# Patient Record
Sex: Female | Born: 1935 | Race: White | Hispanic: No | Smoking: Never smoker
Health system: Southern US, Community
[De-identification: ages and names within clinical notes are randomized; demographics above are authoritative.]

## PROBLEM LIST (undated history)

## (undated) DIAGNOSIS — M1990 Unspecified osteoarthritis, unspecified site: Secondary | ICD-10-CM

## (undated) DIAGNOSIS — C801 Malignant (primary) neoplasm, unspecified: Secondary | ICD-10-CM

## (undated) DIAGNOSIS — I82409 Acute embolism and thrombosis of unspecified deep veins of unspecified lower extremity: Secondary | ICD-10-CM

## (undated) DIAGNOSIS — E785 Hyperlipidemia, unspecified: Secondary | ICD-10-CM

## (undated) DIAGNOSIS — F0390 Unspecified dementia without behavioral disturbance: Secondary | ICD-10-CM

## (undated) DIAGNOSIS — M810 Age-related osteoporosis without current pathological fracture: Secondary | ICD-10-CM

## (undated) DIAGNOSIS — E7800 Pure hypercholesterolemia, unspecified: Secondary | ICD-10-CM

## (undated) HISTORY — PX: SKIN BIOPSY: SHX1

## (undated) HISTORY — DX: Pure hypercholesterolemia, unspecified: E78.00

## (undated) HISTORY — DX: Unspecified dementia, unspecified severity, without behavioral disturbance, psychotic disturbance, mood disturbance, and anxiety: F03.90

## (undated) HISTORY — PX: FRACTURE SURGERY: SHX138

## (undated) HISTORY — PX: ABDOMINAL HYSTERECTOMY: SHX81

## (undated) HISTORY — PX: EYE SURGERY: SHX253

## (undated) HISTORY — DX: Age-related osteoporosis without current pathological fracture: M81.0

## (undated) HISTORY — PX: MASTECTOMY: SHX3

---

## 2004-08-02 ENCOUNTER — Ambulatory Visit: Payer: Self-pay | Admitting: Internal Medicine

## 2004-10-30 ENCOUNTER — Emergency Department: Payer: Self-pay | Admitting: Emergency Medicine

## 2004-11-01 ENCOUNTER — Ambulatory Visit: Payer: Self-pay | Admitting: Internal Medicine

## 2004-11-03 ENCOUNTER — Ambulatory Visit: Payer: Self-pay | Admitting: Internal Medicine

## 2004-11-08 ENCOUNTER — Ambulatory Visit: Payer: Self-pay | Admitting: Internal Medicine

## 2004-11-15 ENCOUNTER — Ambulatory Visit: Payer: Self-pay | Admitting: Internal Medicine

## 2004-12-07 ENCOUNTER — Ambulatory Visit: Payer: Self-pay | Admitting: Psychiatry

## 2005-06-15 ENCOUNTER — Other Ambulatory Visit: Payer: Self-pay

## 2005-06-15 ENCOUNTER — Ambulatory Visit: Payer: Self-pay | Admitting: General Practice

## 2005-06-22 ENCOUNTER — Ambulatory Visit: Payer: Self-pay | Admitting: General Practice

## 2005-07-06 ENCOUNTER — Encounter: Payer: Self-pay | Admitting: General Practice

## 2005-07-16 ENCOUNTER — Encounter: Payer: Self-pay | Admitting: General Practice

## 2005-08-02 ENCOUNTER — Inpatient Hospital Stay: Payer: Self-pay | Admitting: Podiatry

## 2006-02-06 ENCOUNTER — Ambulatory Visit: Payer: Self-pay | Admitting: Internal Medicine

## 2006-06-19 ENCOUNTER — Ambulatory Visit: Payer: Self-pay | Admitting: Surgery

## 2006-06-19 ENCOUNTER — Other Ambulatory Visit: Payer: Self-pay

## 2006-06-27 ENCOUNTER — Ambulatory Visit: Payer: Self-pay | Admitting: Surgery

## 2007-01-30 ENCOUNTER — Ambulatory Visit: Payer: Self-pay | Admitting: Internal Medicine

## 2007-02-11 ENCOUNTER — Ambulatory Visit: Payer: Self-pay | Admitting: Internal Medicine

## 2007-02-19 DIAGNOSIS — C4492 Squamous cell carcinoma of skin, unspecified: Secondary | ICD-10-CM

## 2007-02-19 HISTORY — DX: Squamous cell carcinoma of skin, unspecified: C44.92

## 2007-02-26 ENCOUNTER — Ambulatory Visit: Payer: Self-pay | Admitting: Unknown Physician Specialty

## 2007-03-13 ENCOUNTER — Ambulatory Visit: Payer: Self-pay | Admitting: Otolaryngology

## 2007-04-15 ENCOUNTER — Ambulatory Visit: Payer: Self-pay | Admitting: Otolaryngology

## 2007-08-08 ENCOUNTER — Encounter: Payer: Self-pay | Admitting: Internal Medicine

## 2007-08-10 ENCOUNTER — Encounter: Payer: Self-pay | Admitting: Internal Medicine

## 2007-09-10 ENCOUNTER — Encounter: Payer: Self-pay | Admitting: Internal Medicine

## 2008-01-14 ENCOUNTER — Ambulatory Visit: Payer: Self-pay | Admitting: Unknown Physician Specialty

## 2008-02-13 ENCOUNTER — Ambulatory Visit: Payer: Self-pay | Admitting: Internal Medicine

## 2009-01-17 ENCOUNTER — Ambulatory Visit: Payer: Self-pay | Admitting: Unknown Physician Specialty

## 2009-02-23 ENCOUNTER — Ambulatory Visit: Payer: Self-pay | Admitting: Internal Medicine

## 2009-05-18 ENCOUNTER — Ambulatory Visit: Payer: Self-pay | Admitting: Internal Medicine

## 2009-11-23 ENCOUNTER — Ambulatory Visit: Payer: Self-pay | Admitting: Internal Medicine

## 2010-01-18 ENCOUNTER — Ambulatory Visit: Payer: Self-pay | Admitting: Unknown Physician Specialty

## 2010-04-03 ENCOUNTER — Ambulatory Visit: Payer: Self-pay | Admitting: Internal Medicine

## 2010-04-03 ENCOUNTER — Ambulatory Visit: Payer: Self-pay | Admitting: Ophthalmology

## 2011-04-10 ENCOUNTER — Ambulatory Visit: Payer: Self-pay | Admitting: Internal Medicine

## 2017-12-11 DIAGNOSIS — G3184 Mild cognitive impairment, so stated: Secondary | ICD-10-CM | POA: Insufficient documentation

## 2018-02-09 ENCOUNTER — Other Ambulatory Visit: Payer: Self-pay

## 2018-02-09 ENCOUNTER — Emergency Department
Admission: EM | Admit: 2018-02-09 | Discharge: 2018-02-09 | Disposition: A | Discharge status: Home / Self Care | Payer: Medicare Other | Attending: Emergency Medicine | Admitting: Emergency Medicine

## 2018-02-09 ENCOUNTER — Encounter: Payer: Self-pay | Admitting: Emergency Medicine

## 2018-02-09 ENCOUNTER — Emergency Department: Payer: Medicare Other

## 2018-02-09 DIAGNOSIS — R51 Headache: Secondary | ICD-10-CM | POA: Diagnosis not present

## 2018-02-09 DIAGNOSIS — R519 Headache, unspecified: Secondary | ICD-10-CM

## 2018-02-09 DIAGNOSIS — Z86718 Personal history of other venous thrombosis and embolism: Secondary | ICD-10-CM | POA: Diagnosis not present

## 2018-02-09 DIAGNOSIS — H538 Other visual disturbances: Secondary | ICD-10-CM | POA: Diagnosis present

## 2018-02-09 DIAGNOSIS — Z853 Personal history of malignant neoplasm of breast: Secondary | ICD-10-CM | POA: Insufficient documentation

## 2018-02-09 HISTORY — DX: Malignant (primary) neoplasm, unspecified: C80.1

## 2018-02-09 HISTORY — DX: Unspecified osteoarthritis, unspecified site: M19.90

## 2018-02-09 HISTORY — DX: Hyperlipidemia, unspecified: E78.5

## 2018-02-09 HISTORY — DX: Acute embolism and thrombosis of unspecified deep veins of unspecified lower extremity: I82.409

## 2018-02-09 LAB — DIFFERENTIAL
BASOS PCT: 1 %
Basophils Absolute: 0 10*3/uL (ref 0–0.1)
Eosinophils Absolute: 0.1 10*3/uL (ref 0–0.7)
Eosinophils Relative: 1 %
LYMPHS PCT: 21 %
Lymphs Abs: 1.1 10*3/uL (ref 1.0–3.6)
MONO ABS: 0.4 10*3/uL (ref 0.2–0.9)
Monocytes Relative: 8 %
NEUTROS ABS: 3.5 10*3/uL (ref 1.4–6.5)
Neutrophils Relative %: 69 %

## 2018-02-09 LAB — CBC
HCT: 40.4 % (ref 35.0–47.0)
HEMOGLOBIN: 13.8 g/dL (ref 12.0–16.0)
MCH: 29.7 pg (ref 26.0–34.0)
MCHC: 34.3 g/dL (ref 32.0–36.0)
MCV: 86.6 fL (ref 80.0–100.0)
PLATELETS: 162 10*3/uL (ref 150–440)
RBC: 4.66 MIL/uL (ref 3.80–5.20)
RDW: 14.6 % — ABNORMAL HIGH (ref 11.5–14.5)
WBC: 5.1 10*3/uL (ref 3.6–11.0)

## 2018-02-09 LAB — COMPREHENSIVE METABOLIC PANEL
ALK PHOS: 63 U/L (ref 38–126)
ALT: 14 U/L (ref 0–44)
ANION GAP: 7 (ref 5–15)
AST: 18 U/L (ref 15–41)
Albumin: 3.8 g/dL (ref 3.5–5.0)
BUN: 20 mg/dL (ref 8–23)
CALCIUM: 9.3 mg/dL (ref 8.9–10.3)
CO2: 28 mmol/L (ref 22–32)
Chloride: 104 mmol/L (ref 98–111)
Creatinine, Ser: 0.64 mg/dL (ref 0.44–1.00)
GFR calc non Af Amer: >60 mL/min (ref >60)
Glucose, Bld: 89 mg/dL (ref 70–99)
Potassium: 4.2 mmol/L (ref 3.5–5.1)
Sodium: 139 mmol/L (ref 135–145)
Total Bilirubin: 0.8 mg/dL (ref 0.3–1.2)
Total Protein: 6.6 g/dL (ref 6.5–8.1)

## 2018-02-09 LAB — TROPONIN I

## 2018-02-09 LAB — PROTIME-INR
INR: 1.1
PROTHROMBIN TIME: 14.1 s (ref 11.4–15.2)

## 2018-02-09 LAB — APTT: aPTT: 34 seconds (ref 24–36)

## 2018-02-09 LAB — GLUCOSE, CAPILLARY: Glucose-Capillary: 80 mg/dL (ref 70–99)

## 2018-02-09 NOTE — ED Notes (Signed)
Assisted pt to bathroom

## 2018-02-09 NOTE — ED Provider Notes (Signed)
[facility] Emergency Department Provider Note  ____________________________________________  Time seen: Approximately 3:05 PM  I have reviewed the triage vital signs and the nursing notes.   HISTORY  Chief Complaint Blurred Vision   HPI Anne Oneill is a 84 y.o. female with a history of DVT on Eliquis, cataracts surgery, breast cancer on remission, mitral valve prolapse, hyperlipidemia and osteoporosis who presents for evaluation of abnormal vision.  Patient reports around 11 AM this morning she started having blurry vision and seeing wavy lines.  Started on the left eye, moved to both eyes, and then moved to the right eye.  She developed a mild L sided HA after that which has now subsided.  Patient had a second episode around 2 PM which was similar.  No slurred speech, no facial droop, no unilateral weakness or numbness, no gait instability, no chest pain or shortness of breath.  According to the daughter patient had a similar episode in 05/2017 in [place] for which she was admitted and underwent an extensive work-up including MRI to rule out TIA/CVA.  Imaging studies were all negative for stroke and in the end they were not sure if patient had a migraine with visual aura or a TIA.  Patient denies any history of smoking.  Her father had a stroke.  According to the daughter she feels the patient has been slightly slower than normal today  Past Medical History:  Diagnosis Date  . Arthritis   . Cancer ([place])   . DVT (deep venous thrombosis) ([place])   . Hyperlipemia      Past Surgical History:  Procedure Laterality Date  . ABDOMINAL HYSTERECTOMY    . EYE SURGERY    . FRACTURE SURGERY    . MASTECTOMY Right   . SKIN BIOPSY      Prior to Admission medications   Not on File    Allergies Patient has no known allergies.  FH Heart disease Brother    Heart disease Father    Stroke Father    High blood pressure (Hypertension) Sister       Social  History Social History   Tobacco Use  . Smoking status: Never Smoker  . Smokeless tobacco: Never Used  Substance Use Topics  . Alcohol use: Not Currently  . Drug use: Not Currently    Review of Systems  Constitutional: Negative for fever. Eyes: + blurry vision ENT: Negative for sore throat. Neck: No neck pain  Cardiovascular: Negative for chest pain. Respiratory: Negative for shortness of breath. Gastrointestinal: Negative for abdominal pain, vomiting or diarrhea. Genitourinary: Negative for dysuria. Musculoskeletal: Negative for back pain. Skin: Negative for rash. Neurological: Negative for headaches, weakness or numbness. Psych: No SI or HI  ____________________________________________   PHYSICAL EXAM:  VITAL SIGNS: ED Triage Vitals [02/09/18 1401]  Enc Vitals Group     BP 139/70     Pulse Rate 74     Resp 16     Temp 98.4 F (36.9 C)     Temp Source Oral     SpO2 96 %     Weight 98 lb (44.5 kg)     Height 5' (1.524 m)     Head Circumference      Peak Flow      Pain Score 0     Pain Loc      Pain Edu?      Excl. in [place]?     Constitutional: Alert and oriented. Well appearing and in no apparent  distress. HEENT:      Head: Normocephalic and atraumatic.         Eyes: Conjunctivae are normal. Sclera is non-icteric.       Mouth/Throat: Mucous membranes are moist.       Neck: Supple with no signs of meningismus. No bruits Cardiovascular: Regular rate and rhythm. No murmurs, gallops, or rubs. 2+ symmetrical distal pulses are present in all extremities. No JVD. Respiratory: Normal respiratory effort. Lungs are clear to auscultation bilaterally. No wheezes, crackles, or rhonchi.  Gastrointestinal: Soft, non tender, and non distended with positive bowel sounds. No rebound or guarding. Musculoskeletal: Nontender with normal range of motion in all extremities. No edema, cyanosis, or erythema of extremities. Neurologic: Normal speech and language. A & O x3, PERRL,  EOMI, no nystagmus, CN II-XII intact, motor testing reveals good tone and bulk throughout. There is no evidence of pronator drift or dysmetria. Muscle strength is 5/5 throughout. Sensory examination is intact. Gait deferred. Skin: Skin is warm, dry and intact. No rash noted. Psychiatric: Mood and affect are normal. Speech and behavior are normal.  ____________________________________________   LABS (all labs ordered are listed, but only abnormal results are displayed)  Labs Reviewed  CBC - Abnormal; Notable for the following components:      Result Value   RDW 14.6 (*)    All other components within normal limits  GLUCOSE, CAPILLARY  PROTIME-INR  APTT  DIFFERENTIAL  COMPREHENSIVE METABOLIC PANEL  TROPONIN I  CBG MONITORING, ED   ____________________________________________  EKG  ED ECG REPORT I, Rudene Re, the attending physician, personally viewed and interpreted this ECG.  Normal sinus rhythm, rate of 76, normal intervals, normal axis, LVH, no ST elevations or depressions, T wave inversion in aVL.  Unchanged from prior from 2008 ____________________________________________  RADIOLOGY  I have personally reviewed the images performed during this visit and I agree with the Radiologist's read.   Interpretation by Radiologist:  Ct Head Wo Contrast  Result Date: 02/09/2018 CLINICAL DATA:  Possible TIA. Blurred vision that began about 10 minutes prior to emergency department arrival, beginning in the RIGHT eye and thin involving the BILATERAL eyes. Patient states that her vision has now improved. Acute mental status changes earlier today according to the patient's daughter with confusion. EXAM: CT HEAD WITHOUT CONTRAST TECHNIQUE: Contiguous axial images were obtained from the base of the skull through the vertex without intravenous contrast. COMPARISON:  MRI brain 11/03/2004.  No prior CT. FINDINGS: Brain: Moderate age related cortical, deep and cerebellar atrophy. Severe  changes of small vessel disease of the white matter, most prominent in the parietal lobes. Dystrophic calcification involving the LEFT cerebellar hemisphere. No mass lesion. No midline shift. No acute hemorrhage or hematoma. No extra-axial fluid collections. No evidence of acute infarction. Vascular: Mild BILATERAL carotid siphon and LEFT vertebral artery atherosclerosis. No convincing hyperdense vessel. Skull: No skull fracture or other focal osseous abnormality involving the skull. Sinuses/Orbits: Visualized paranasal sinuses, bilateral mastoid air cells and bilateral middle ear cavities well-aerated. Visualized orbits and globes normal in appearance apart from prior ophthalmologic surgery involving the RIGHT eye. Other: None. IMPRESSION: 1. No acute intracranial abnormality. 2. Age related moderate generalized atrophy and severe changes of small vessel disease of the white matter. 3. Calcification involving the LEFT cerebellar hemisphere, likely dystrophic and related to prior trauma or ischemia. Electronically Signed   By: Evangeline Dakin M.D.   On: 02/09/2018 15:24   Mr Brain Wo Contrast  Result Date: 02/09/2018 CLINICAL DATA:  TIA,  initial exam.  Blurry vision that has improved. EXAM: MRI HEAD WITHOUT CONTRAST TECHNIQUE: Multiplanar, multiecho pulse sequences of the brain and surrounding structures were obtained without intravenous contrast. COMPARISON:  Head CT earlier today.  Brain MRI 11/03/2004 FINDINGS: Brain: No acute infarction, hemorrhage, hydrocephalus, extra-axial collection or mass effect. There is generalized cerebral volume loss and mild to moderate for age chronic small vessel ischemia in the cerebral white matter. A 9 mm nodule along the left aspect of the falx in the high left frontal region is attributed to meningioma. This nodule is new from 2006. Vascular: Very diminutive left carotid artery with intracranial normalization likely from the anterior communicating artery. This has been  noted since at least 2006. Skull and upper cervical spine: No evidence of marrow lesion. Cervical facet spurring with C4-5 anterolisthesis. Sinuses/Orbits: Bilateral cataract resection IMPRESSION: 1. No acute finding. 2. Chronic diminutive left carotid artery with intracranial normalization at the anterior communicating artery. 3. Atrophy and chronic small vessel ischemia that has progressed from 2006. 4. 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 Electronically Signed   By: Monte Fantasia M.D.   On: 02/09/2018 18:46      ____________________________________________   PROCEDURES  Procedure(s) performed: None Procedures Critical Care performed:  None ____________________________________________   INITIAL IMPRESSION / ASSESSMENT AND PLAN / ED COURSE  84 y.o. female with a history of DVT on Eliquis, cataracts surgery, breast cancer on remission, mitral valve prolapse, hyperlipidemia and osteoporosis who presents for evaluation of abnormal vision.  Differential diagnoses including visual aura from a possible migraine versus a TIA.  Patient is completely neurologically intact at this time.  I will pursue a CT head and MRI and if patient remains neurologically intact with negative imaging I will refer her to neurology since this seems to be the second time she has had these symptoms.    _________________________ 7:01 PM on 02/09/2018 -----------------------------------------  CT, MRI, and labs with no acute findings.  Patient remains neurologically intact.  Will refer patient to neurology for further evaluation.  Discussed return precautions for any signs of acute stroke.   As part of my medical decision making, I reviewed the following data within the [place] History obtained from family, Nursing notes reviewed and incorporated, Labs reviewed , EKG interpreted , Old EKG reviewed, Old chart reviewed, Radiograph reviewed , Notes from prior ED visits and [place] Controlled Substance  Database    Pertinent labs & imaging results that were available during my care of the patient were reviewed by me and considered in my medical decision making (see chart for details).    ____________________________________________   FINAL CLINICAL IMPRESSION(S) / ED DIAGNOSES  Final diagnoses:  Blurry vision, bilateral  Acute nonintractable headache, unspecified headache type      NEW MEDICATIONS STARTED DURING THIS VISIT:  ED Discharge Orders    None       Note:  This document was prepared using Dragon voice recognition software and may include unintentional dictation errors.    Alfred Levins, [place], MD 02/09/18 1902

## 2018-02-09 NOTE — Discharge Instructions (Addendum)
Follow up with Neurology. Return to the ER for slurred speech, difficulty finding words, facial droop, changes in vision, unilateral weakness or numbness, or severe headache.

## 2018-02-09 NOTE — ED Triage Notes (Signed)
Pt to ED via POV c/o blurred vision that started about 10 minutes PTA. Pt states that it started in the right eye then went to the left and then both eyes became blurry. Pt states that her vision is better now. Pt denies numbness or weakness, HA, facial droop, or slurred speech. Pt dtr states that since around 1100 this morning that she has noticed that pt is "not herself". Pt dtr states that she is slower to respond. PT states that she felt confused this morning, like she "couldn't get it together". Pt is currently in NAD.

## 2018-02-11 DIAGNOSIS — D329 Benign neoplasm of meninges, unspecified: Secondary | ICD-10-CM | POA: Insufficient documentation

## 2018-04-23 ENCOUNTER — Other Ambulatory Visit
Admission: RE | Admit: 2018-04-23 | Discharge: 2018-04-23 | Disposition: A | Discharge status: Home / Self Care | Payer: Medicare Other | Source: Ambulatory Visit | Attending: Surgery | Admitting: Surgery

## 2018-04-23 DIAGNOSIS — Z86718 Personal history of other venous thrombosis and embolism: Secondary | ICD-10-CM | POA: Diagnosis present

## 2018-04-23 DIAGNOSIS — M810 Age-related osteoporosis without current pathological fracture: Secondary | ICD-10-CM | POA: Diagnosis present

## 2018-04-23 LAB — FIBRIN DERIVATIVES D-DIMER (ARMC ONLY): Fibrin derivatives D-dimer (ARMC): 675.36 ng/mL (FEU) — ABNORMAL HIGH (ref 0.00–499.00)

## 2019-04-27 ENCOUNTER — Other Ambulatory Visit: Payer: Self-pay | Admitting: Neurology

## 2019-04-27 ENCOUNTER — Other Ambulatory Visit (HOSPITAL_COMMUNITY): Payer: Self-pay | Admitting: Neurology

## 2019-04-27 DIAGNOSIS — R4189 Other symptoms and signs involving cognitive functions and awareness: Secondary | ICD-10-CM

## 2019-05-11 ENCOUNTER — Other Ambulatory Visit: Payer: Self-pay

## 2019-05-11 ENCOUNTER — Ambulatory Visit
Admission: RE | Admit: 2019-05-11 | Discharge: 2019-05-11 | Disposition: A | Discharge status: Home / Self Care | Payer: Medicare Other | Source: Ambulatory Visit | Attending: Neurology | Admitting: Neurology

## 2019-05-11 DIAGNOSIS — R4189 Other symptoms and signs involving cognitive functions and awareness: Secondary | ICD-10-CM | POA: Diagnosis present

## 2019-08-25 ENCOUNTER — Emergency Department
Admission: EM | Admit: 2019-08-25 | Discharge: 2019-08-25 | Disposition: A | Discharge status: Home / Self Care | Payer: Medicare Other | Attending: Emergency Medicine | Admitting: Emergency Medicine

## 2019-08-25 ENCOUNTER — Other Ambulatory Visit: Payer: Self-pay

## 2019-08-25 ENCOUNTER — Emergency Department: Payer: Medicare Other

## 2019-08-25 DIAGNOSIS — H538 Other visual disturbances: Secondary | ICD-10-CM | POA: Diagnosis not present

## 2019-08-25 DIAGNOSIS — Z86718 Personal history of other venous thrombosis and embolism: Secondary | ICD-10-CM | POA: Insufficient documentation

## 2019-08-25 DIAGNOSIS — Z853 Personal history of malignant neoplasm of breast: Secondary | ICD-10-CM | POA: Insufficient documentation

## 2019-08-25 DIAGNOSIS — R2681 Unsteadiness on feet: Secondary | ICD-10-CM | POA: Diagnosis not present

## 2019-08-25 LAB — DIFFERENTIAL
Abs Immature Granulocytes: 0.02 10*3/uL (ref 0.00–0.07)
Basophils Absolute: 0 10*3/uL (ref 0.0–0.1)
Basophils Relative: 0 %
Eosinophils Absolute: 0.1 10*3/uL (ref 0.0–0.5)
Eosinophils Relative: 1 %
Immature Granulocytes: 0 %
Lymphocytes Relative: 20 %
Lymphs Abs: 1.1 10*3/uL (ref 0.7–4.0)
Monocytes Absolute: 0.5 10*3/uL (ref 0.1–1.0)
Monocytes Relative: 9 %
Neutro Abs: 3.7 10*3/uL (ref 1.7–7.7)
Neutrophils Relative %: 70 %

## 2019-08-25 LAB — CBC
HCT: 42.9 % (ref 36.0–46.0)
Hemoglobin: 13.9 g/dL (ref 12.0–15.0)
MCH: 29.9 pg (ref 26.0–34.0)
MCHC: 32.4 g/dL (ref 30.0–36.0)
MCV: 92.3 fL (ref 80.0–100.0)
Platelets: 175 10*3/uL (ref 150–400)
RBC: 4.65 MIL/uL (ref 3.87–5.11)
RDW: 12.7 % (ref 11.5–15.5)
WBC: 5.4 10*3/uL (ref 4.0–10.5)
nRBC: 0 % (ref 0.0–0.2)

## 2019-08-25 LAB — COMPREHENSIVE METABOLIC PANEL
ALT: 26 U/L (ref 0–44)
AST: 25 U/L (ref 15–41)
Albumin: 3.9 g/dL (ref 3.5–5.0)
Alkaline Phosphatase: 59 U/L (ref 38–126)
Anion gap: 7 (ref 5–15)
BUN: 20 mg/dL (ref 8–23)
CO2: 27 mmol/L (ref 22–32)
Calcium: 9.1 mg/dL (ref 8.9–10.3)
Chloride: 104 mmol/L (ref 98–111)
Creatinine, Ser: 0.8 mg/dL (ref 0.44–1.00)
GFR calc Af Amer: >60 mL/min (ref >60)
GFR calc non Af Amer: >60 mL/min (ref >60)
Glucose, Bld: 93 mg/dL (ref 70–99)
Potassium: 3.8 mmol/L (ref 3.5–5.1)
Sodium: 138 mmol/L (ref 135–145)
Total Bilirubin: 0.9 mg/dL (ref 0.3–1.2)
Total Protein: 6.5 g/dL (ref 6.5–8.1)

## 2019-08-25 LAB — APTT: aPTT: 35 seconds (ref 24–36)

## 2019-08-25 LAB — PROTIME-INR
INR: 1.1 (ref 0.8–1.2)
Prothrombin Time: 13.9 seconds (ref 11.4–15.2)

## 2019-08-25 NOTE — ED Notes (Signed)
Updated family and pt about status. Will continue to monitor the pt .

## 2019-08-25 NOTE — ED Triage Notes (Signed)
Pt is here with her daughter, states the pt reported last night she had very blurred vision in the left eye lasting about an hour and today while the care giver was with her she was unsteady with her gait and c/o blurred vision again in the left eye. Pt is alert but disoriented at baseline.. denies any vision issues at present. Denies any pain.

## 2019-08-25 NOTE — ED Provider Notes (Signed)
[facility] Emergency Department Provider Note  ____________________________________________   First MD Initiated Contact with Patient 08/25/19 1622     (approximate)  I have reviewed the triage vital signs and the nursing notes.   HISTORY  Chief Complaint Loss of Vision    HPI Anne Oneill is a 86 y.o. female with below list of previous medical conditions presents to the emergency department secondary to acute onset of left eye blurred vision which occurred last night for approximately 1 hour with recurrence today followed again by spontaneous resolution.  Patient's daughter at bedside stated that she also noted some unsteady gait which is new for the patient.  Patient denies any symptoms at present no nausea vomiting.  Patient denies any headache.  Patient denies any weakness numbness or any current visual changes.       Past Medical History:  Diagnosis Date  . Arthritis   . Cancer ([place])   . DVT (deep venous thrombosis) ([place])   . Hyperlipemia     There are no problems to display for this patient.   Past Surgical History:  Procedure Laterality Date  . ABDOMINAL HYSTERECTOMY    . EYE SURGERY    . FRACTURE SURGERY    . MASTECTOMY Right   . SKIN BIOPSY      Prior to Admission medications   Not on File    Allergies Codeine, Hydrocodone, Morphine, Tramadol, and Latex  No family history on file.  Social History Social History   Tobacco Use  . Smoking status: Never Smoker  . Smokeless tobacco: Never Used  Substance Use Topics  . Alcohol use: Not Currently  . Drug use: Not Currently    Review of Systems Constitutional: No fever/chills Eyes: No visual changes. ENT: No sore throat. Cardiovascular: Denies chest pain. Respiratory: Denies shortness of breath. Gastrointestinal: No abdominal pain.  No nausea, no vomiting.  No diarrhea.  No constipation. Genitourinary: Negative for dysuria. Musculoskeletal: Negative for neck pain.   Negative for back pain. Integumentary: Negative for rash. Neurological: Negative for headaches, focal weakness or numbness.  ____________________________________________   PHYSICAL EXAM:  VITAL SIGNS: ED Triage Vitals  Enc Vitals Group     BP 08/25/19 1229 (!) 171/68     Pulse Rate 08/25/19 1229 72     Resp 08/25/19 1229 18     Temp 08/25/19 1229 98.6 F (37 C)     Temp Source 08/25/19 1229 Oral     SpO2 08/25/19 1229 98 %     Weight 08/25/19 1239 44.5 kg (98 lb)     Height 08/25/19 1239 1.549 m (5\' 1" )     Head Circumference --      Peak Flow --      Pain Score 08/25/19 1239 0     Pain Loc --      Pain Edu? --      Excl. in [place]? --     Constitutional: Alert and oriented.  Eyes: Conjunctivae are normal.  Head: Atraumatic. Mouth/Throat: Patient is wearing a mask. Neck: No stridor.  No meningeal signs.   Cardiovascular: Normal rate, regular rhythm. Good peripheral circulation. Grossly normal heart sounds. Respiratory: Normal respiratory effort.  No retractions. Gastrointestinal: Soft and nontender. No distention.  Musculoskeletal: No lower extremity tenderness nor edema. No gross deformities of extremities. Neurologic:  Normal speech and language. No gross focal neurologic deficits are appreciated.  Skin:  Skin is warm, dry and intact. Psychiatric: Mood and affect are normal. Speech and behavior are  normal.  ____________________________________________   LABS (all labs ordered are listed, but only abnormal results are displayed)  Labs Reviewed  PROTIME-INR  APTT  CBC  DIFFERENTIAL  COMPREHENSIVE METABOLIC PANEL  CBG MONITORING, ED   _______________________________  RADIOLOGY I, [place], personally viewed and evaluated these images (plain radiographs) as part of my medical decision making, as well as reviewing the written report by the radiologist.  ED MD interpretation:   No acute intracranial findings  Official radiology report(s): CT HEAD WO  CONTRAST  Result Date: 08/25/2019 CLINICAL DATA:  Possible stroke; focal neuro deficit, greater than 6 hours, stroke suspected. Additional history provided: Blurred vision left eye, unsteady gait. EXAM: CT HEAD WITHOUT CONTRAST TECHNIQUE: Contiguous axial images were obtained from the base of the skull through the vertex without intravenous contrast. COMPARISON:  Brain MRI 05/11/2019, head CT 02/09/2018 FINDINGS: Brain: There is no evidence of acute intracranial hemorrhage, intracranial mass, midline shift or extra-axial fluid collection.No demarcated cortical infarction. Moderate ill-defined hypoattenuation within the cerebral white matter is similar to prior exam and nonspecific, but consistent with chronic small vessel ischemic disease. Stable, moderate generalized parenchymal atrophy. Redemonstrated calcification within the left cerebellum. Vascular: No hyperdense vessel.  Atherosclerotic calcifications Skull: Normal. Negative for fracture or focal lesion. Sinuses/Orbits: Visualized orbits demonstrate no acute abnormality. No significant paranasal sinus disease or mastoid effusion at the imaged levels. IMPRESSION: No evidence of acute intracranial abnormality. Stable moderate generalized parenchymal atrophy and chronic small vessel ischemic disease. Electronically Signed   By: Kellie Simmering DO   On: 08/25/2019 13:42      Procedures   ____________________________________________   INITIAL IMPRESSION / MDM / [place] / ED COURSE  As part of my medical decision making, I reviewed the following data within the electronic MEDICAL RECORD NUMBER   86 year old female presented with above-stated history and physical exam secondary to blurred vision and episode of unsteady gait today.  Consider possibly CVA and as such CT scan was performed which revealed no acute findings.  Subsequently MRI of the brain was performed again revealing no acute findings.  Patient's laboratory data unremarkable.   Patient remained symptom-free while in the emergency department.  Patient referred to Dr. Wallace Going ophthalmology for further outpatient evaluation as well as Dr. Caryl Comes her primary care provider  ____________________________________________  FINAL CLINICAL IMPRESSION(S) / ED DIAGNOSES  Final diagnoses:  Blurred vision, left eye     MEDICATIONS GIVEN DURING THIS VISIT:  Medications - No data to display   ED Discharge Orders    None      *Please note:  Anne Oneill was evaluated in Emergency Department on 08/25/2019 for the symptoms described in the history of present illness. She was evaluated in the context of the global COVID-19 pandemic, which necessitated consideration that the patient might be at risk for infection with the SARS-CoV-2 virus that causes COVID-19. Institutional protocols and algorithms that pertain to the evaluation of patients at risk for COVID-19 are in a state of rapid change based on information released by regulatory bodies including the CDC and federal and state organizations. These policies and algorithms were followed during the patient's care in the ED.  Some ED evaluations and interventions may be delayed as a result of limited staffing during the pandemic.*  Note:  This document was prepared using Dragon voice recognition software and may include unintentional dictation errors.   Gregor Hams, MD 08/25/19 2148

## 2019-08-25 NOTE — ED Notes (Signed)
Pt ambulatory to toilet with walker. Ambulated well, fast. Daughter with pt in restroom.

## 2019-08-25 NOTE — ED Notes (Signed)
Pt taken to MRI at this time

## 2019-09-06 DIAGNOSIS — F0280 Dementia in other diseases classified elsewhere without behavioral disturbance: Secondary | ICD-10-CM | POA: Insufficient documentation

## 2019-09-06 DIAGNOSIS — F0150 Vascular dementia without behavioral disturbance: Secondary | ICD-10-CM | POA: Insufficient documentation

## 2019-11-04 ENCOUNTER — Ambulatory Visit: Payer: Medicare Other | Admitting: Urology

## 2019-11-04 ENCOUNTER — Encounter: Payer: Self-pay | Admitting: Emergency Medicine

## 2019-11-04 ENCOUNTER — Other Ambulatory Visit: Payer: Self-pay

## 2019-11-04 ENCOUNTER — Emergency Department
Admission: EM | Admit: 2019-11-04 | Discharge: 2019-11-04 | Disposition: A | Discharge status: Home / Self Care | Payer: Medicare Other | Attending: Emergency Medicine | Admitting: Emergency Medicine

## 2019-11-04 DIAGNOSIS — Z5321 Procedure and treatment not carried out due to patient leaving prior to being seen by health care provider: Secondary | ICD-10-CM | POA: Diagnosis not present

## 2019-11-04 DIAGNOSIS — R197 Diarrhea, unspecified: Secondary | ICD-10-CM | POA: Insufficient documentation

## 2019-11-04 DIAGNOSIS — R112 Nausea with vomiting, unspecified: Secondary | ICD-10-CM | POA: Diagnosis present

## 2019-11-04 LAB — URINALYSIS, COMPLETE (UACMP) WITH MICROSCOPIC
Bacteria, UA: NONE SEEN
Bilirubin Urine: NEGATIVE
Glucose, UA: NEGATIVE mg/dL
Ketones, ur: NEGATIVE mg/dL
Nitrite: NEGATIVE
Protein, ur: NEGATIVE mg/dL
Specific Gravity, Urine: 1.023 (ref 1.005–1.030)
pH: 5 (ref 5.0–8.0)

## 2019-11-04 LAB — COMPREHENSIVE METABOLIC PANEL
ALT: 26 U/L (ref 0–44)
AST: 24 U/L (ref 15–41)
Albumin: 3.7 g/dL (ref 3.5–5.0)
Alkaline Phosphatase: 49 U/L (ref 38–126)
Anion gap: 10 (ref 5–15)
BUN: 24 mg/dL — ABNORMAL HIGH (ref 8–23)
CO2: 26 mmol/L (ref 22–32)
Calcium: 8.7 mg/dL — ABNORMAL LOW (ref 8.9–10.3)
Chloride: 102 mmol/L (ref 98–111)
Creatinine, Ser: 0.72 mg/dL (ref 0.44–1.00)
GFR calc Af Amer: >60 mL/min (ref >60)
GFR calc non Af Amer: >60 mL/min (ref >60)
Glucose, Bld: 144 mg/dL — ABNORMAL HIGH (ref 70–99)
Potassium: 3.4 mmol/L — ABNORMAL LOW (ref 3.5–5.1)
Sodium: 138 mmol/L (ref 135–145)
Total Bilirubin: 1.2 mg/dL (ref 0.3–1.2)
Total Protein: 6.5 g/dL (ref 6.5–8.1)

## 2019-11-04 LAB — CBC
HCT: 43.8 % (ref 36.0–46.0)
Hemoglobin: 15 g/dL (ref 12.0–15.0)
MCH: 30.4 pg (ref 26.0–34.0)
MCHC: 34.2 g/dL (ref 30.0–36.0)
MCV: 88.8 fL (ref 80.0–100.0)
Platelets: 161 10*3/uL (ref 150–400)
RBC: 4.93 MIL/uL (ref 3.87–5.11)
RDW: 12.9 % (ref 11.5–15.5)
WBC: 9.6 10*3/uL (ref 4.0–10.5)
nRBC: 0 % (ref 0.0–0.2)

## 2019-11-04 LAB — LIPASE, BLOOD: Lipase: 24 U/L (ref 11–51)

## 2019-11-04 MED ORDER — SODIUM CHLORIDE 0.9% FLUSH: 3.0000 mL | Freq: Once | INTRAVENOUS | Status: DC

## 2019-11-04 NOTE — ED Triage Notes (Signed)
Pt in via EMS from [facility] with c/o exposure. Per EMS, staff member at [facility] states that a staff member went to a big family gathering and was exposed to [place] and now the patient is having NVD. 142/62, 86HR, 96%RA, 98.6temp

## 2019-11-04 NOTE — ED Notes (Signed)
Pt daughter to first nurse. Daughter is Therapist, sports and wants to take pt home. NAD

## 2019-11-04 NOTE — ED Triage Notes (Signed)
Vomiting and diarrhea since 1700 yesterday evening.  Patient states "I don't feel well".  Daughter states this morning temp was 100.2 and patient was lethargic.    Patient is AAOx3.  Skin warm and dry. NAD

## 2019-11-05 ENCOUNTER — Ambulatory Visit: Payer: Medicare Other | Admitting: Urology

## 2019-11-17 ENCOUNTER — Encounter: Payer: Self-pay | Admitting: Dermatology

## 2019-11-17 ENCOUNTER — Other Ambulatory Visit: Payer: Self-pay

## 2019-11-17 ENCOUNTER — Emergency Department
Admission: EM | Admit: 2019-11-17 | Discharge: 2019-11-17 | Disposition: A | Discharge status: Home / Self Care | Payer: Medicare Other | Attending: Emergency Medicine | Admitting: Emergency Medicine

## 2019-11-17 ENCOUNTER — Emergency Department: Payer: Medicare Other

## 2019-11-17 DIAGNOSIS — Z85828 Personal history of other malignant neoplasm of skin: Secondary | ICD-10-CM | POA: Diagnosis not present

## 2019-11-17 DIAGNOSIS — S6991XA Unspecified injury of right wrist, hand and finger(s), initial encounter: Secondary | ICD-10-CM | POA: Diagnosis present

## 2019-11-17 DIAGNOSIS — Z91040 Latex allergy status: Secondary | ICD-10-CM | POA: Diagnosis not present

## 2019-11-17 DIAGNOSIS — R262 Difficulty in walking, not elsewhere classified: Secondary | ICD-10-CM | POA: Diagnosis not present

## 2019-11-17 DIAGNOSIS — Y939 Activity, unspecified: Secondary | ICD-10-CM | POA: Insufficient documentation

## 2019-11-17 DIAGNOSIS — S52614A Nondisplaced fracture of right ulna styloid process, initial encounter for closed fracture: Secondary | ICD-10-CM | POA: Insufficient documentation

## 2019-11-17 DIAGNOSIS — Y999 Unspecified external cause status: Secondary | ICD-10-CM | POA: Diagnosis not present

## 2019-11-17 DIAGNOSIS — F0390 Unspecified dementia without behavioral disturbance: Secondary | ICD-10-CM | POA: Insufficient documentation

## 2019-11-17 DIAGNOSIS — W19XXXA Unspecified fall, initial encounter: Secondary | ICD-10-CM | POA: Insufficient documentation

## 2019-11-17 DIAGNOSIS — R0781 Pleurodynia: Secondary | ICD-10-CM | POA: Insufficient documentation

## 2019-11-17 DIAGNOSIS — R2681 Unsteadiness on feet: Secondary | ICD-10-CM | POA: Diagnosis not present

## 2019-11-17 DIAGNOSIS — Y92129 Unspecified place in nursing home as the place of occurrence of the external cause: Secondary | ICD-10-CM | POA: Diagnosis not present

## 2019-11-17 DIAGNOSIS — S0990XA Unspecified injury of head, initial encounter: Secondary | ICD-10-CM | POA: Diagnosis not present

## 2019-11-17 DIAGNOSIS — S52501A Unspecified fracture of the lower end of right radius, initial encounter for closed fracture: Secondary | ICD-10-CM | POA: Diagnosis not present

## 2019-11-17 LAB — COMPREHENSIVE METABOLIC PANEL
ALT: 29 U/L (ref 0–44)
AST: 18 U/L (ref 15–41)
Albumin: 3.5 g/dL (ref 3.5–5.0)
Alkaline Phosphatase: 61 U/L (ref 38–126)
Anion gap: 9 (ref 5–15)
BUN: 17 mg/dL (ref 8–23)
CO2: 28 mmol/L (ref 22–32)
Calcium: 8.9 mg/dL (ref 8.9–10.3)
Chloride: 107 mmol/L (ref 98–111)
Creatinine, Ser: 0.76 mg/dL (ref 0.44–1.00)
GFR calc Af Amer: >60 mL/min (ref >60)
GFR calc non Af Amer: >60 mL/min (ref >60)
Glucose, Bld: 100 mg/dL — ABNORMAL HIGH (ref 70–99)
Potassium: 3.4 mmol/L — ABNORMAL LOW (ref 3.5–5.1)
Sodium: 144 mmol/L (ref 135–145)
Total Bilirubin: 1.3 mg/dL — ABNORMAL HIGH (ref 0.3–1.2)
Total Protein: 6.1 g/dL — ABNORMAL LOW (ref 6.5–8.1)

## 2019-11-17 LAB — CBC WITH DIFFERENTIAL/PLATELET
Abs Immature Granulocytes: 0.03 10*3/uL (ref 0.00–0.07)
Basophils Absolute: 0 10*3/uL (ref 0.0–0.1)
Basophils Relative: 0 %
Eosinophils Absolute: 0 10*3/uL (ref 0.0–0.5)
Eosinophils Relative: 0 %
HCT: 41.7 % (ref 36.0–46.0)
Hemoglobin: 13.8 g/dL (ref 12.0–15.0)
Immature Granulocytes: 0 %
Lymphocytes Relative: 14 %
Lymphs Abs: 1 10*3/uL (ref 0.7–4.0)
MCH: 30.2 pg (ref 26.0–34.0)
MCHC: 33.1 g/dL (ref 30.0–36.0)
MCV: 91.2 fL (ref 80.0–100.0)
Monocytes Absolute: 0.5 10*3/uL (ref 0.1–1.0)
Monocytes Relative: 6 %
Neutro Abs: 5.8 10*3/uL (ref 1.7–7.7)
Neutrophils Relative %: 80 %
Platelets: 178 10*3/uL (ref 150–400)
RBC: 4.57 MIL/uL (ref 3.87–5.11)
RDW: 12.8 % (ref 11.5–15.5)
WBC: 7.4 10*3/uL (ref 4.0–10.5)
nRBC: 0 % (ref 0.0–0.2)

## 2019-11-17 LAB — URINALYSIS, COMPLETE (UACMP) WITH MICROSCOPIC
Bacteria, UA: NONE SEEN
Bilirubin Urine: NEGATIVE
Glucose, UA: NEGATIVE mg/dL
Hgb urine dipstick: NEGATIVE
Ketones, ur: 5 mg/dL — AB
Leukocytes,Ua: NEGATIVE
Nitrite: NEGATIVE
Protein, ur: NEGATIVE mg/dL
Specific Gravity, Urine: 1.009 (ref 1.005–1.030)
Squamous Epithelial / HPF: NONE SEEN (ref 0–5)
pH: 9 — ABNORMAL HIGH (ref 5.0–8.0)

## 2019-11-17 MED ORDER — ACETAMINOPHEN 500 MG PO TABS
1000.0000 mg | ORAL_TABLET | Freq: Once | ORAL | Status: AC
  Administered 2019-11-17: 1000 mg via ORAL
  Filled 2019-11-17: qty 2

## 2019-11-17 NOTE — ED Notes (Signed)
Pt's daughter received discharge paperwork. Pt wheeled to lobby and placed in car with no issue.

## 2019-11-17 NOTE — TOC Initial Note (Signed)
Transition of Care [facility]) - Initial/Assessment Note    Patient Details  Name: Anne Oneill MRN: [id number] Date of Birth: July 13, 1926  [facility] CM/SW Contact:    Adelene Amas, [place] Phone Number: 11/17/2019, 1:44 PM  Clinical Narrative:                  Patient presents to [facility] due to fall.  This CSW spoke to patient's daughter Anne Oneill [phone], and she stated since the patient fell and broke her hip her health has been declining and difficulty with her gait and performing ADLs, including increased confusion. Anne Oneill stated the patient has increased urination at night and is having difficulty sleeping throughout the night which Anne Oneill believes, contributes to her increased confusion. Anne Oneill stated the patient usually used a walker to ambualte and she is concerned our to the patient's injury she would not be able to get around since she'll need her hands to maneuver the walker.  Anne Oneill is also concerned about the patient's long term care, and would like to explore the possibility of the patient being placed in a long term care or memory care facility.  This CSW explained to Anne Oneill the process of getting a patient placed to a SNF from the ED.  This CSW also explained to Anne Oneill that placement would be dependent on the recommendations of the PT and whether insurance would approve placement.  This CSW also informed Anne Oneill the recommendation could home health and the patient may be returned to [facility] with home health. This CSW gave Anne Oneill Medicare.gov information. Anne Oneill verbalized understanding.             Patient Goals and CMS Choice        Expected Discharge Plan and Services                                                Prior Living Arrangements/Services                       Activities of Daily Living      Permission Sought/Granted                  Emotional  Assessment              Admission diagnosis:  fall ems There are no problems to display for this patient.  PCP:  Adin Hector, MD Pharmacy:  No Pharmacies Listed    Social Determinants of Health (SDOH) Interventions    Readmission Risk Interventions No flowsheet data found.

## 2019-11-17 NOTE — ED Provider Notes (Signed)
-----------------------------------------   5:06 PM on 11/17/2019 -----------------------------------------  PT has seen the patient.  They do not qualify for SNF placement per PT.  I spoke to the social worker who has arranged for home health for the patient.  I have filled out a home health/face-to-face form for the patient.  We will discharge back to her current living facility.  Daughter agreeable to plan of care.   Harvest Dark, MD 11/17/19 [phone]

## 2019-11-17 NOTE — TOC Transition Note (Addendum)
Transition of Care [facility]) - CM/SW Discharge Note   Patient Details  Name: Anne Oneill MRN: [id number] Date of Birth: 05-14-1927  [facility] CM/SW Contact:  Ova Freshwater Phone Number: [phone] 11/17/2019, 3:45 PM   Clinical Narrative:     Pt will d/c to [facility] w/ home health PT.  Patient will be transported w/ daughter [name] [phone], EDP/ED Staff notified.  CSW reached out to [facility] at [facility], [phone]. Pending PT note and order.  Final next level of care: Assisted Living Barriers to Discharge: ED Barriers Resolved   Patient Goals and CMS Choice   CMS Medicare.gov Compare Post Acute Care list provided to:: Patient Represenative (must comment) Choice offered to / list presented to : Adult Children(Marie [name] [phone])  Discharge Placement                       Discharge Plan and Services In-house Referral: Clinical Social Work   Post Acute Care Choice: Home Health                    HH Arranged: PT          Social Determinants of Health (SDOH) Interventions     Readmission Risk Interventions No flowsheet data found.

## 2019-11-17 NOTE — ED Triage Notes (Signed)
Pt arrived via [facility] from [facility] after a fall with injury to her right forearm. Pt fell last night and was able to gret herself back in bed. Pt c/o of right wrist pain.

## 2019-11-17 NOTE — ED Provider Notes (Signed)
ER Provider Note       Time seen: 8:55 AM    I have reviewed the vital signs and the nursing notes.  HISTORY   Chief Complaint No chief complaint on file.   HPI Anne Oneill is a 86 y.o. female with a history of arthritis, cancer, dementia, DVT, hyperlipidemia who presents today for a fall.  Patient states she does not know why she fell, she thinks she lost her balance.  She does have a history of vascular dementia.  Main complaint is right wrist pain, right rib pain and she states she did hit her head.  Past Medical History:  Diagnosis Date  . Arthritis   . Cancer ([place])   . Dementia ([place])    mild  . DVT (deep venous thrombosis) ([place])   . High cholesterol   . Hyperlipemia   . Osteoporosis   . Squamous cell carcinoma of skin 02/19/2007   R cheek    Past Surgical History:  Procedure Laterality Date  . ABDOMINAL HYSTERECTOMY    . EYE SURGERY    . FRACTURE SURGERY    . MASTECTOMY Right   . SKIN BIOPSY      Allergies Codeine, Hydrocodone, Morphine, Tramadol, and Latex  Review of Systems Constitutional: Negative for fever. HEENT: Positive for right-sided jaw pain Cardiovascular: Positive for right-sided rib pain Respiratory: Negative for shortness of breath. Gastrointestinal: Negative for abdominal pain, vomiting and diarrhea. Musculoskeletal: Positive for right wrist pain Skin: Negative for rash. Neurological: Positive for headache  All systems negative/normal/unremarkable except as stated in the HPI  ____________________________________________   PHYSICAL EXAM:  VITAL SIGNS: There were no vitals filed for this visit.  Constitutional: Alert, Well appearing and in no distress. Eyes: Conjunctivae are normal. Normal extraocular movements. ENT      Head: Normocephalic and atraumatic.      Nose: No congestion/rhinnorhea.      Mouth/Throat: Mucous membranes are moist.      Neck: No stridor. Cardiovascular: Normal rate, regular rhythm. No murmurs,  rubs, or gallops. Respiratory: Normal respiratory effort without tachypnea nor retractions. Breath sounds are clear and equal bilaterally. No wheezes/rales/rhonchi. Gastrointestinal: Soft and nontender. Normal bowel sounds Musculoskeletal: Dorsal right wrist swelling is noted, pain with range of motion of the right wrist, tenderness over the mid axillary right ribs Neurologic:  Normal speech and language. No gross focal neurologic deficits are appreciated.  Skin:  Skin is warm, dry and intact. No rash noted. Psychiatric: Speech and behavior are normal.  ____________________________________________   LABS (pertinent positives/negatives)  Labs Reviewed  COMPREHENSIVE METABOLIC PANEL - Abnormal; Notable for the following components:      Result Value   Potassium 3.4 (*)    Glucose, Bld 100 (*)    Total Protein 6.1 (*)    Total Bilirubin 1.3 (*)    All other components within normal limits  URINALYSIS, COMPLETE (UACMP) WITH MICROSCOPIC - Abnormal; Notable for the following components:   Color, Urine YELLOW (*)    APPearance CLOUDY (*)    pH 9.0 (*)    Ketones, ur 5 (*)    All other components within normal limits  CBC WITH DIFFERENTIAL/PLATELET    RADIOLOGY  Images were viewed by me CT head, maxillofacial, right wrist x-rays, right rib x-rays IMPRESSION: CT of the head: Chronic atrophic and ischemic changes without acute abnormality.  CT of the maxillofacial bones: No acute bony abnormality is noted. IMPRESSION: No evident rib fracture. No pneumothorax. Lungs clear. Cardiac silhouette normal.  Aortic  Atherosclerosis (ICD10-I70.0). IMPRESSION: 1. Fracture distal radial metaphysis with impaction at the fracture site.  2. Fracture of the ulnar styloid with alignment essentially anatomic.  3. Extensive arthropathy in the scaphotrapezial joint with chronic bony remodeling of the trapezium.  4. Calcification in the triangular fibrocartilage which may be of arthropathic  etiology but also may be indicative of prior chronic triangular fibrocartilage region tear.   DIFFERENTIAL DIAGNOSIS  Fall, minor head injury, fracture, dislocation, pneumothorax, occult infection, dehydration, electrolyte abnormality  ASSESSMENT AND PLAN  Fall, minor head injury, distal radius and ulnar styloid fracture   Plan: The patient had presented for a fall. Patient's labs did not reveal any acute process.  CT of the head and maxillofacial were unremarkable, no rib fractures were identified.  She did have a distal radius and ulnar styloid fracture which was treated with a Velcro wrist brace which she can adjust.  She walks with a walker and the family's concerns about her going back to [facility] which is independent living.  We will consult social work for placement.  Lenise Arena MD    Note: This note was generated in part or whole with voice recognition software. Voice recognition is usually quite accurate but there are transcription errors that can and very often do occur. I apologize for any typographical errors that were not detected and corrected.     Earleen Newport, MD 11/17/19 [phone]

## 2019-11-17 NOTE — Evaluation (Signed)
Physical Therapy Evaluation Patient Details Name: Anne Oneill MRN: [id number] DOB: 1926-12-01 Today's Date: 11/17/2019   History of Present Illness  86 y.o. female with a history of arthritis, cancer, dementia, DVT, hyperlipidemia who presents today for a fall.  Patient states she does not know why she fell, she thinks she lost her balance.  She does have a history of vascular dementia.  Main complaint is right wrist pain, found to have distal ulnar/radial fx; wearing splint at eval.  Clinical Impression  Pt did well with PT exam and though she had R wrist brace was able to effectively do bed mobility mostly using the L and had the ability to place R UE on walker to manage it w/o excessive weight through walker.  She was slow and hesitant initially with ambulation but ultimately increased speed and cadence and did ~25 ft both with single light UE use and no UEs at all. Pt pleasantly confused t/o the session, but willing and able to participate with all that is asked of her. Daughter present and though she reports that mental status and mobility are not quite at baseline that she is not far from her normal.  Pt lives in independent living but does have 4hr of assistance in the AM, she will benefit from [facility] and will likely have some increased difficulty related to dominant hand now being in a splint to any increased amount of assistance would be beneficial when she returns to independent living.       Follow Up Recommendations Home health PT;Supervision for mobility/OOB    Equipment Recommendations  None recommended by PT    Recommendations for Other Services       Precautions / Restrictions Precautions Precautions: Fall Required Braces or Orthoses: (R wrist splint) Restrictions Weight Bearing Restrictions: No Other Position/Activity Restrictions: R wrist splint in place      Mobility  Bed Mobility Overal bed mobility: Modified Independent             General bed mobility  comments: Pt was able to get herself to EOB and to sitting w/o direct assist, slower to get back into bed but able to get LEs up and torso back to supine w/o assist.  Biggest issue was pt awareness on sitting fully back onto high bed, almost seemingly unaware that she could slide off because of the height and her positioning  Transfers Overall transfer level: Modified independent Equipment used: Rolling walker (2 wheeled)             General transfer comment: Pt was able to rise with only light use of UEs pushing up from bed, able to maintain balance with little to no reliance on the walker  Ambulation/Gait Ambulation/Gait assistance: Supervision Gait Distance (Feet): 125 Feet Assistive device: Rolling walker (2 wheeled);1 person hand held assist;None       General Gait Details: Pt initially starting off with hesitant, slow, shuffling gait.  Once we got to the hallway and a longer straightaway she significantly increased cadence/speed/confidence and ultimately did ~75 ft with walker, ~25 ft with single HHA and then the final ~25 ft w/o UE support.  The effort appears to be quite close to baseline and showed safe management of in-home distances   Stairs            Wheelchair Mobility    Modified Rankin (Stroke Patients Only)       Balance Overall balance assessment: Needs assistance Sitting-balance support: No upper extremity supported Sitting balance-Leahy Scale: Good  Sitting balance - Comments: able to maintain sitting balance w/o assist, however showed poor awareness that bed was elevated height and though she did not appear to be sliding forward her positioning was not idea and cues to scoot back better onto bed did help   Standing balance support: Single extremity supported Standing balance-Leahy Scale: Good Standing balance comment: Pt able to maintain balance with light walker use, light single UE use and even did well with no UE support both static and dynamically                              Pertinent Vitals/Pain Pain Assessment: Faces Faces Pain Scale: Hurts little more Pain Location: R mid rib cage is main area of pain c/o, some R wirst and hip    Home Living Family/patient expects to be discharged to:: Assisted living(Independent living at [facility])               Home Equipment: [place] Rile - 4 wheels      Prior Function Level of Independence: Independent with assistive device(s)         Comments: pt has aides 4hr each morning, family checks in almost daily     Hand Dominance        Extremity/Trunk Assessment   Upper Extremity Assessment Upper Extremity Assessment: Overall WFL for tasks assessed(age appropriate deficits, R wrist not tested)    Lower Extremity Assessment Lower Extremity Assessment: Overall WFL for tasks assessed       Communication   Communication: No difficulties  Cognition Arousal/Alertness: Awake/alert Behavior During Therapy: WFL for tasks assessed/performed Overall Cognitive Status: History of cognitive impairments - at baseline                                 General Comments: daughter reports that mental status may be slightly off per normal, but that       General Comments General comments (skin integrity, edema, etc.): O2 and HR WNL and stable t/o the effort    Exercises     Assessment/Plan    PT Assessment Patient needs continued PT services  PT Problem List Decreased strength;Decreased range of motion;Decreased activity tolerance;Decreased balance;Decreased mobility;Decreased cognition;Decreased coordination;Decreased knowledge of use of DME;Decreased safety awareness;Pain       PT Treatment Interventions DME instruction;Gait training;Functional mobility training;Therapeutic activities;Therapeutic exercise;Balance training;Neuromuscular re-education;Cognitive remediation;Patient/family education    PT Goals (Current goals can be found in the Care Plan  section)  Acute Rehab PT Goals Patient Stated Goal: go home PT Goal Formulation: With patient/family Time For Goal Achievement: 12/01/19 Potential to Achieve Goals: Good    Frequency Min 2X/week   Barriers to discharge        Co-evaluation               AM-PAC PT "6 Clicks" Mobility  Outcome Measure Help needed turning from your back to your side while in a flat bed without using bedrails?: None Help needed moving from lying on your back to sitting on the side of a flat bed without using bedrails?: None Help needed moving to and from a bed to a chair (including a wheelchair)?: None Help needed standing up from a chair using your arms (e.g., wheelchair or bedside chair)?: None Help needed to walk in hospital room?: A Little Help needed climbing 3-5 steps with a railing? : A Little 6 Click Score:  22    End of Session Equipment Utilized During Treatment: Gait belt Activity Tolerance: Patient tolerated treatment well Patient left: in bed;with call bell/phone within reach;with family/visitor present   PT Visit Diagnosis: Difficulty in walking, not elsewhere classified (R26.2);Unsteadiness on feet (R26.81)    Time: 3361-2244 PT Time Calculation (min) (ACUTE ONLY): 31 min   Charges:   PT Evaluation $PT Eval Low Complexity: 1 Low PT Treatments $Gait Training: 8-22 mins        Kreg Shropshire, DPT 11/17/2019, 3:57 PM

## 2019-11-18 ENCOUNTER — Ambulatory Visit: Payer: Medicare Other | Admitting: Dermatology

## 2019-11-23 ENCOUNTER — Ambulatory Visit (INDEPENDENT_AMBULATORY_CARE_PROVIDER_SITE_OTHER): Payer: Medicare Other | Admitting: Urology

## 2019-11-23 ENCOUNTER — Other Ambulatory Visit: Payer: Self-pay

## 2019-11-23 ENCOUNTER — Encounter: Payer: Self-pay | Admitting: Urology

## 2019-11-23 VITALS — BP 174/81 | HR 65 | Ht 60.0 in | Wt 96.0 lb

## 2019-11-23 DIAGNOSIS — R350 Frequency of micturition: Secondary | ICD-10-CM | POA: Diagnosis not present

## 2019-11-23 DIAGNOSIS — N3941 Urge incontinence: Secondary | ICD-10-CM

## 2019-11-23 MED ORDER — MIRABEGRON ER 50 MG PO TB24
50.0000 mg | ORAL_TABLET | Freq: Every day | ORAL | 11 refills | Status: DC
Start: 2019-11-23 — End: 2020-01-04

## 2019-11-23 NOTE — Progress Notes (Signed)
11/23/2019 11:04 AM   Anne Oneill 01-22-1927 [id number]  Referring provider: Adin Hector, MD [place] [facility] [place],  [place] [address]  No chief complaint on file.   HPI: I was consulted to assess the patient's worsening nocturia.  I think she is also having a little bit of worsening urge incontinence with a commode next to her bed.  She starts to leak if she tries to get undressed.  Her daughter thinks she has had a few other infections more recently.  She voids every 2 hours.  She is very fastidious and will change a pad even if it is damp.  She has no bedwetting.  She is bothered by getting up 3 times at night.  No ankle edema.  No diuretic  Patient had confusion from an anticholinergic.  I clarified this with her daughter who is a Marine scientist and she said she is never had Myrbetriq to cause confusion.  It was Ditropan  No history of kidney stones or bladder surgery.   PMH: Past Medical History:  Diagnosis Date  . Arthritis   . Cancer ([place])   . Dementia ([place])    mild  . DVT (deep venous thrombosis) ([place])   . High cholesterol   . Hyperlipemia   . Osteoporosis   . Squamous cell carcinoma of skin 02/19/2007   R cheek    Surgical History: Past Surgical History:  Procedure Laterality Date  . ABDOMINAL HYSTERECTOMY    . EYE SURGERY    . FRACTURE SURGERY    . MASTECTOMY Right   . SKIN BIOPSY      Home Medications:  Allergies as of 11/23/2019      Reactions   Codeine Nausea Only   Hydrocodone Nausea Only   Mirabegron Other (See Comments)   confusion   Morphine Nausea Only   Tramadol Nausea Only   Latex Rash      Medication List       Accurate as of November 23, 2019 11:04 AM. If you have any questions, ask your nurse or doctor.        Calcium Carbonate-Vitamin D 600-400 MG-UNIT tablet Take by mouth.   cyanocobalamin 1000 MCG tablet Take by mouth.   donepezil 10 MG tablet Commonly known as: ARICEPT Take 10 mg by mouth  daily.   Eliquis 2.5 MG Tabs tablet Generic drug: apixaban Take 2.5 mg by mouth 2 (two) times daily.   escitalopram 5 MG tablet Commonly known as: LEXAPRO Take 5 mg by mouth daily.   ondansetron 4 MG disintegrating tablet Commonly known as: ZOFRAN-ODT 4 mg every 8 (eight) hours as needed.   simvastatin 20 MG tablet Commonly known as: ZOCOR Take 20 mg by mouth at bedtime.       Allergies:  Allergies  Allergen Reactions  . Codeine Nausea Only  . Hydrocodone Nausea Only  . Mirabegron Other (See Comments)    confusion  . Morphine Nausea Only  . Tramadol Nausea Only  . Latex Rash    Family History: No family history on file.  Social History:  reports that she has never smoked. She has never used smokeless tobacco. She reports previous alcohol use. She reports previous drug use.  ROS:                                        Physical Exam: There were no  vitals taken for this visit.  Constitutional:  Alert and oriented, No acute distress. HEENT: [place] AT, moist mucus membranes.  Trachea midline, no masses. Cardiovascular: No clubbing, cyanosis, or edema. Respiratory: Normal respiratory effort, no increased work of breathing. GI: Abdomen is soft, nontender, nondistended, no abdominal masses GU: No CVA tenderness.  Well supported bladder neck and no prolapse with narrow introitus Skin: No rashes, bruises or suspicious lesions. Lymph: No cervical or inguinal adenopathy. Neurologic: Grossly intact, no focal deficits, moving all 4 extremities. Psychiatric: Normal mood and affect.  Laboratory Data: Lab Results  Component Value Date   WBC 7.4 11/17/2019   HGB 13.8 11/17/2019   HCT 41.7 11/17/2019   MCV 91.2 11/17/2019   PLT 178 11/17/2019    Lab Results  Component Value Date   CREATININE 0.76 11/17/2019    No results found for: PSA  No results found for: TESTOSTERONE  No results found for: HGBA1C  Urinalysis    Component Value  Date/Time   COLORURINE YELLOW (A) 11/17/2019 1126   APPEARANCEUR CLOUDY (A) 11/17/2019 1126   LABSPEC 1.009 11/17/2019 1126   PHURINE 9.0 (H) 11/17/2019 1126   GLUCOSEU NEGATIVE 11/17/2019 1126   [place] 11/17/2019 1126   BILIRUBINUR NEGATIVE 11/17/2019 1126   KETONESUR 5 (A) 11/17/2019 1126   [place] 11/17/2019 1126   NITRITE NEGATIVE 11/17/2019 1126   LEUKOCYTESUR NEGATIVE 11/17/2019 1126    Pertinent Imaging: Urine reviewed.  Urine culture sent.  Chart reviewed.  Bladder scan within normal limits  Assessment & Plan: Patient has worsening nocturia and urge incontinence.    Reassess on Myrbetriq samples and prescription 50 mg.  Patient unable to give enough urine even with the catheter.  We will need to get a urine culture next time.  It will be interesting if she has a bladder infection.  We will not try anticholinergics in the future  There are no diagnoses linked to this encounter.  No follow-ups on file.  Reece Packer, MD  [place] [address] [place], [place] [address] [phone]

## 2020-01-04 ENCOUNTER — Other Ambulatory Visit: Payer: Self-pay

## 2020-01-04 ENCOUNTER — Ambulatory Visit (INDEPENDENT_AMBULATORY_CARE_PROVIDER_SITE_OTHER): Payer: Medicare Other | Admitting: Urology

## 2020-01-04 ENCOUNTER — Encounter: Payer: Self-pay | Admitting: Urology

## 2020-01-04 VITALS — BP 176/81 | HR 77 | Ht 60.0 in | Wt 98.8 lb

## 2020-01-04 DIAGNOSIS — N3946 Mixed incontinence: Secondary | ICD-10-CM

## 2020-01-04 DIAGNOSIS — Z8673 Personal history of transient ischemic attack (TIA), and cerebral infarction without residual deficits: Secondary | ICD-10-CM | POA: Insufficient documentation

## 2020-01-04 DIAGNOSIS — K449 Diaphragmatic hernia without obstruction or gangrene: Secondary | ICD-10-CM | POA: Insufficient documentation

## 2020-01-04 DIAGNOSIS — M810 Age-related osteoporosis without current pathological fracture: Secondary | ICD-10-CM | POA: Insufficient documentation

## 2020-01-04 DIAGNOSIS — Z853 Personal history of malignant neoplasm of breast: Secondary | ICD-10-CM | POA: Insufficient documentation

## 2020-01-04 DIAGNOSIS — I341 Nonrheumatic mitral (valve) prolapse: Secondary | ICD-10-CM | POA: Insufficient documentation

## 2020-01-04 DIAGNOSIS — R351 Nocturia: Secondary | ICD-10-CM

## 2020-01-04 DIAGNOSIS — R1013 Epigastric pain: Secondary | ICD-10-CM | POA: Insufficient documentation

## 2020-01-04 DIAGNOSIS — Z86718 Personal history of other venous thrombosis and embolism: Secondary | ICD-10-CM | POA: Insufficient documentation

## 2020-01-04 DIAGNOSIS — E785 Hyperlipidemia, unspecified: Secondary | ICD-10-CM | POA: Insufficient documentation

## 2020-01-04 MED ORDER — MIRABEGRON ER 50 MG PO TB24: 50.0000 mg | ORAL_TABLET | Freq: Every day | ORAL | 3 refills | Status: DC

## 2020-01-04 NOTE — Progress Notes (Signed)
01/04/2020 10:38 AM   Ladona Ridgel Holik Jun 04, 1927 [id number]  Referring provider: Adin Hector, MD [place] [facility] [place],   [id number]  Chief Complaint  Patient presents with  . Follow-up    HPI: I was consulted to assess the patient's worsening nocturia.  I think she is also having a little bit of worsening urge incontinence with a commode next to her bed.  She starts to leak if she tries to get undressed.  Her daughter thinks she has had a few other infections more recently.  She voids every 2 hours.  She is very fastidious and will change a pad even if it is damp.  She has no bedwetting.  She is bothered by getting up 3 times at night.  No ankle edema.  No diuretic  Patient had confusion from an anticholinergic.  I clarified this with her daughter who is a Marine scientist and she said she is never had Myrbetriq to cause confusion.  It was Ditropan  Patient has worsening nocturia and urge incontinence.    Reassess on Myrbetriq samples and prescription 50 mg.  Patient unable to give enough urine even with the catheter.  We will need to get a urine culture next time.  It will be interesting if she has a bladder infection.  We will not try anticholinergics in the future  Today Frequency improved.  Nocturia improved.  Urge incontinence improved.  Clinically not infected   PMH: Past Medical History:  Diagnosis Date  . Arthritis   . Cancer ([place])   . Dementia ([place])    mild  . DVT (deep venous thrombosis) ([place])   . High cholesterol   . Hyperlipemia   . Osteoporosis   . Squamous cell carcinoma of skin 02/19/2007   R cheek    Surgical History: Past Surgical History:  Procedure Laterality Date  . ABDOMINAL HYSTERECTOMY    . EYE SURGERY    . FRACTURE SURGERY    . MASTECTOMY Right   . SKIN BIOPSY      Home Medications:  Allergies as of 01/04/2020      Reactions   Codeine Nausea Only   Ditropan [oxybutynin]    Hydrocodone Nausea Only    Morphine Nausea Only   Tramadol Nausea Only   Latex Rash      Medication List       Accurate as of January 04, 2020 10:38 AM. If you have any questions, ask your nurse or doctor.        Calcium 600+D Plus Minerals 600-400 MG-UNIT Tabs Take by mouth.   Calcium Carbonate-Vitamin D 600-400 MG-UNIT tablet Take by mouth.   cyanocobalamin 1000 MCG tablet Take by mouth.   donepezil 10 MG tablet Commonly known as: ARICEPT Take 10 mg by mouth daily.   Eliquis 2.5 MG Tabs tablet Generic drug: apixaban Take 2.5 mg by mouth 2 (two) times daily.   escitalopram 5 MG tablet Commonly known as: LEXAPRO Take 5 mg by mouth daily.   mirabegron ER 50 MG Tb24 tablet Commonly known as: MYRBETRIQ Take 1 tablet (50 mg total) by mouth daily.   ondansetron 4 MG disintegrating tablet Commonly known as: ZOFRAN-ODT 4 mg every 8 (eight) hours as needed.   simvastatin 20 MG tablet Commonly known as: ZOCOR Take 20 mg by mouth at bedtime.       Allergies:  Allergies  Allergen Reactions  . Codeine Nausea Only  . Ditropan [Oxybutynin]   . Hydrocodone Nausea Only  .  Morphine Nausea Only  . Tramadol Nausea Only  . Latex Rash    Family History: No family history on file.  Social History:  reports that she has never smoked. She has never used smokeless tobacco. She reports previous alcohol use. She reports previous drug use.  ROS:                                        Physical Exam: BP (!) 176/81 (BP Location: Left Arm, Patient Position: Sitting, Cuff Size: Normal)   Pulse 77   Ht 5' (1.524 m)   Wt 98 lb 12.8 oz (44.8 kg)   BMI 19.30 kg/m    Laboratory Data: Lab Results  Component Value Date   WBC 7.4 11/17/2019   HGB 13.8 11/17/2019   HCT 41.7 11/17/2019   MCV 91.2 11/17/2019   PLT 178 11/17/2019    Lab Results  Component Value Date   CREATININE 0.76 11/17/2019    No results found for: PSA  No results found for: TESTOSTERONE  No results  found for: HGBA1C  Urinalysis    Component Value Date/Time   COLORURINE YELLOW (A) 11/17/2019 1126   APPEARANCEUR CLOUDY (A) 11/17/2019 1126   LABSPEC 1.009 11/17/2019 1126   PHURINE 9.0 (H) 11/17/2019 1126   GLUCOSEU NEGATIVE 11/17/2019 1126   HGBUR NEGATIVE 11/17/2019 1126   BILIRUBINUR NEGATIVE 11/17/2019 1126   KETONESUR 5 (A) 11/17/2019 1126   PROTEINUR NEGATIVE 11/17/2019 1126   NITRITE NEGATIVE 11/17/2019 1126   LEUKOCYTESUR NEGATIVE 11/17/2019 1126    Pertinent Imaging:   Assessment & Plan: 90x3 sent to pharmacy and I will see in 1 year  There are no diagnoses linked to this encounter.  No follow-ups on file.  Reece Packer, MD  [place] [address] [place], [place] [address] [phone]

## 2020-02-18 ENCOUNTER — Ambulatory Visit: Payer: Medicare Other | Admitting: Dermatology

## 2020-02-24 ENCOUNTER — Emergency Department: Payer: Medicare Other

## 2020-02-24 ENCOUNTER — Other Ambulatory Visit: Payer: Self-pay

## 2020-02-24 ENCOUNTER — Emergency Department
Admission: EM | Admit: 2020-02-24 | Discharge: 2020-02-24 | Disposition: A | Discharge status: Home / Self Care | Payer: Medicare Other | Attending: Emergency Medicine | Admitting: Emergency Medicine

## 2020-02-24 DIAGNOSIS — Z7901 Long term (current) use of anticoagulants: Secondary | ICD-10-CM | POA: Diagnosis not present

## 2020-02-24 DIAGNOSIS — W19XXXA Unspecified fall, initial encounter: Secondary | ICD-10-CM

## 2020-02-24 DIAGNOSIS — F0281 Dementia in other diseases classified elsewhere with behavioral disturbance: Secondary | ICD-10-CM | POA: Diagnosis not present

## 2020-02-24 DIAGNOSIS — G309 Alzheimer's disease, unspecified: Secondary | ICD-10-CM | POA: Insufficient documentation

## 2020-02-24 DIAGNOSIS — Y939 Activity, unspecified: Secondary | ICD-10-CM | POA: Diagnosis not present

## 2020-02-24 DIAGNOSIS — W01198A Fall on same level from slipping, tripping and stumbling with subsequent striking against other object, initial encounter: Secondary | ICD-10-CM | POA: Diagnosis not present

## 2020-02-24 DIAGNOSIS — S0990XA Unspecified injury of head, initial encounter: Secondary | ICD-10-CM | POA: Insufficient documentation

## 2020-02-24 DIAGNOSIS — S51012A Laceration without foreign body of left elbow, initial encounter: Secondary | ICD-10-CM

## 2020-02-24 DIAGNOSIS — Y999 Unspecified external cause status: Secondary | ICD-10-CM | POA: Diagnosis not present

## 2020-02-24 DIAGNOSIS — Z79899 Other long term (current) drug therapy: Secondary | ICD-10-CM | POA: Diagnosis not present

## 2020-02-24 DIAGNOSIS — Z86718 Personal history of other venous thrombosis and embolism: Secondary | ICD-10-CM | POA: Insufficient documentation

## 2020-02-24 DIAGNOSIS — Z8673 Personal history of transient ischemic attack (TIA), and cerebral infarction without residual deficits: Secondary | ICD-10-CM | POA: Insufficient documentation

## 2020-02-24 DIAGNOSIS — Z91040 Latex allergy status: Secondary | ICD-10-CM | POA: Diagnosis not present

## 2020-02-24 DIAGNOSIS — Z853 Personal history of malignant neoplasm of breast: Secondary | ICD-10-CM | POA: Diagnosis not present

## 2020-02-24 DIAGNOSIS — R0789 Other chest pain: Secondary | ICD-10-CM | POA: Insufficient documentation

## 2020-02-24 DIAGNOSIS — Y929 Unspecified place or not applicable: Secondary | ICD-10-CM | POA: Diagnosis not present

## 2020-02-24 NOTE — ED Provider Notes (Signed)
[facility] Emergency Department Provider Note   ____________________________________________   First MD Initiated Contact with Patient 02/24/20 1304     (approximate)  I have reviewed the triage vital signs and the nursing notes.   HISTORY  Chief Complaint Fall    HPI Anne Oneill is a 86 y.o. female with a stated past medical history of dementia, hyperlipidemia, and osteoporosis who presents after a mechanical fall from standing onto her left side striking the left side of her head as well.  Patient denies any loss of consciousness.  Patient states she is on Eliquis for history of blood clots.  Patient complains of 6/10, left chest wall pain that is worsened with movement, palpation, or deep breathing.  Patient denies any subsequent loss of consciousness, vision changes, weakness/numbness/paresthesias in any extremity.         Past Medical History:  Diagnosis Date  . Arthritis   . Cancer ([place])   . Dementia ([place])    mild  . DVT (deep venous thrombosis) ([place])   . High cholesterol   . Hyperlipemia   . Osteoporosis   . Squamous cell carcinoma of skin 02/19/2007   R cheek    Patient Active Problem List   Diagnosis Date Noted  . Dyspepsia 01/04/2020  . Hiatal hernia 01/04/2020  . History of breast cancer 01/04/2020  . History of DVT (deep vein thrombosis) 01/04/2020  . History of TIA (transient ischemic attack) 01/04/2020  . Hyperlipidemia 01/04/2020  . Mitral valve prolapse 01/04/2020  . Osteoporosis 01/04/2020  . 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 Meningioma ([place]) 02/11/2018  . Mild cognitive impairment 12/11/2017    Past Surgical History:  Procedure Laterality Date  . ABDOMINAL HYSTERECTOMY    . EYE SURGERY    . FRACTURE SURGERY    . MASTECTOMY Right   . SKIN BIOPSY      Prior to Admission medications   Medication Sig Start Date End Date Taking? Authorizing Provider  Calcium Carbonate-Vit D-Min  (CALCIUM 600+D PLUS MINERALS) 600-400 MG-UNIT TABS Take by mouth.    [provider]  Calcium Carbonate-Vitamin D 600-400 MG-UNIT tablet Take by mouth.    [provider]  cyanocobalamin 1000 MCG tablet Take by mouth.    [provider]  donepezil (ARICEPT) 10 MG tablet Take 10 mg by mouth daily. 10/26/19   [provider]  ELIQUIS 2.5 MG TABS tablet Take 2.5 mg by mouth 2 (two) times daily. 11/04/19   [provider]  escitalopram (LEXAPRO) 5 MG tablet Take 5 mg by mouth daily. 10/23/19   [provider]  mirabegron ER (MYRBETRIQ) 50 MG TB24 tablet Take 1 tablet (50 mg total) by mouth daily. 01/04/20   Bjorn Loser, MD  simvastatin (ZOCOR) 20 MG tablet Take 20 mg by mouth at bedtime. 09/20/19   [provider]    Allergies Codeine, Ditropan [oxybutynin], Hydrocodone, Morphine, Tramadol, and Latex  No family history on file.  Social History Social History   Tobacco Use  . Smoking status: Never Smoker  . Smokeless tobacco: Never Used  Substance Use Topics  . Alcohol use: Not Currently  . Drug use: Not Currently    Review of Systems Constitutional: No fever/chills Eyes: No visual changes. ENT: No sore throat. Cardiovascular: Endorses chest pain. Respiratory: Denies shortness of breath. Gastrointestinal: No abdominal pain.  No nausea, no vomiting.  No diarrhea. Genitourinary: Negative for dysuria. Musculoskeletal: Negative for acute arthralgias Skin: Negative for rash. Neurological: Negative  for headaches, weakness/numbness/paresthesias in any extremity Psychiatric: Negative for suicidal ideation/homicidal ideation   ____________________________________________   PHYSICAL EXAM:  VITAL SIGNS: ED Triage Vitals  Enc Vitals Group     BP 02/24/20 0823 (!) 163/74     Pulse Rate 02/24/20 0823 65     Resp 02/24/20 0823 17     Temp 02/24/20 0823 98.2 F (36.8 C)     Temp Source 02/24/20 0823 Oral     SpO2  02/24/20 0823 98 %     Weight 02/24/20 0819 95 lb (43.1 kg)     Height 02/24/20 0819 5' (1.524 m)     Head Circumference --      Peak Flow --      Pain Score --      Pain Loc --      Pain Edu? --      Excl. in [place]? --    Constitutional: Alert and oriented. Well appearing and in no acute distress. Eyes: Conjunctivae are normal. PERRL. EOMI. Head: Atraumatic. Nose: No congestion/rhinnorhea. Mouth/Throat: Mucous membranes are moist. Neck: No stridor Cardiovascular: Normal rate, regular rhythm. Grossly normal heart sounds.  Good peripheral circulation. Respiratory: Normal respiratory effort.  No retractions. Gastrointestinal: Soft and nontender. No distention. Musculoskeletal: Left chest wall tenderness to palpation no lower extremity tenderness nor edema.  No joint effusions. Neurologic:  Normal speech and language. No gross focal neurologic deficits are appreciated. Skin:  Skin is warm and dry. No rash noted. small skin tear to left lateral elbow Psychiatric: Mood and affect are normal. Speech and behavior are normal.  ____________________________________________   LABS (all labs ordered are listed, but only abnormal results are displayed)  Labs Reviewed - No data to display ____________________________________________  RADIOLOGY  ED MD interpretation: X-ray of the left ribs and chest with 3 views shows no displaced rib fractures or hemothorax CT noncontrast of the brain does not show any evidence of acute abnormalities  Official radiology report(s): DG Ribs Unilateral W/Chest Left  Result Date: 02/24/2020 CLINICAL DATA:  Fall, pain LEFT breast and rib pain EXAM: LEFT RIBS AND CHEST - 3+ VIEW COMPARISON:  11/17/2019 FINDINGS: Cardiomediastinal contours with stable mild cardiac enlargement. Hilar structures are stable. Lungs are hyperinflated with signs of pulmonary emphysema. Suggestion of nodular densities albeit subtle in the LEFT upper lobe. No lobar level consolidation. No  sign of pneumothorax. No sign of pleural effusion. Evidence of prior RIGHT axillary dissection and likely mastectomy as before. Osteopenia. No displaced rib fracture. IMPRESSION: 1. No displaced rib fracture, no pneumothorax. 2. Question of nodular densities albeit subtle in the LEFT upper lobe. This could relate to an infectious or inflammatory process or even scarring though this was not seen on prior imaging. Consider follow-up chest CT to ensure resolution in this patient with history of breast cancer and RIGHT mastectomy. Would also correlate with any infectious symptoms. Electronically Signed   By: Zetta Bills M.D.   On: 02/24/2020 09:09   CT Head Wo Contrast  Result Date: 02/24/2020 CLINICAL DATA:  Trauma EXAM: CT HEAD WITHOUT CONTRAST TECHNIQUE: Contiguous axial images were obtained from the base of the skull through the vertex without intravenous contrast. COMPARISON:  11/17/2019 head CT and prior. FINDINGS: Brain: No acute infarct or intracranial hemorrhage. 0.7 cm calcified left parafalcine meningioma at the vertex. No midline shift, ventriculomegaly or extra-axial fluid collection. Diffuse parenchymal volume loss with ex vacuo dilatation. Chronic microvascular ischemic changes. Left cerebellar calcification, unchanged. Vascular: No hyperdense vessel. Bilateral skull base atherosclerotic calcifications.  Skull: No acute finding.  Unchanged small left frontal osteoma. Sinuses/Orbits: Normal orbits. Clear paranasal sinuses. No mastoid effusion. Other: None. IMPRESSION: No acute intracranial process. Age-related cerebral atrophy and chronic microvascular ischemic changes. Subcentimeter left vertex meningioma. Electronically Signed   By: Primitivo Gauze M.D.   On: 02/24/2020 10:27    ____________________________________________   PROCEDURES  Procedure(s) performed (including Critical Care):  Procedures   ____________________________________________   INITIAL IMPRESSION / ASSESSMENT  AND PLAN / ED COURSE        Patient presents after mechanical fall from standing complaining of headache, left chest pain, and a left elbow skin tear Imaging was negative for any acute abnormalities including rib fractures, intracranial hemorrhage, pneumothorax, or hemothorax Patient skin tear repaired at bedside with Steri-Strips after cleansing.  Patient agrees with plan for discharge home and follow-up with her primary care physician for any new or worsening symptoms.      ____________________________________________   FINAL CLINICAL IMPRESSION(S) / ED DIAGNOSES  Final diagnoses:  Fall, initial encounter  Injury of head, initial encounter  Skin tear of elbow without complication, left, initial encounter     ED Discharge Orders    None       Note:  This document was prepared using Dragon voice recognition software and may include unintentional dictation errors.   Naaman Plummer, MD 02/24/20 1345

## 2020-02-24 NOTE — ED Notes (Signed)
See triage note  Daughter states she was getting up to [facility]   Slipped  Hitting her head and left lateral rib area

## 2020-02-24 NOTE — ED Notes (Signed)
Pt alert and oriented X 4, stable for discharge. RR even and unlabored, color WNL. Discussed discharge instructions and follow-up as directed. Discharge medications discussed if provided. Pt had opportunity to ask questions if necessary and RN to provide patient/family eduction.  

## 2020-02-24 NOTE — ED Triage Notes (Signed)
Per pt daughter, the pt slipped and fell hitting her head, left elbow skin tear, left breast/rib pain. Pt is alert on arrival, at baseline with hx of dementia.

## 2020-04-04 ENCOUNTER — Ambulatory Visit: Payer: Medicare Other | Admitting: Dermatology

## 2020-07-30 ENCOUNTER — Emergency Department
Admission: EM | Admit: 2020-07-30 | Discharge: 2020-07-30 | Disposition: A | Discharge status: Home / Self Care | Payer: Medicare Other | Attending: Emergency Medicine | Admitting: Emergency Medicine

## 2020-07-30 ENCOUNTER — Other Ambulatory Visit: Payer: Self-pay

## 2020-07-30 ENCOUNTER — Emergency Department: Payer: Medicare Other

## 2020-07-30 DIAGNOSIS — Z853 Personal history of malignant neoplasm of breast: Secondary | ICD-10-CM | POA: Diagnosis not present

## 2020-07-30 DIAGNOSIS — R911 Solitary pulmonary nodule: Secondary | ICD-10-CM | POA: Diagnosis not present

## 2020-07-30 DIAGNOSIS — Z91040 Latex allergy status: Secondary | ICD-10-CM | POA: Insufficient documentation

## 2020-07-30 DIAGNOSIS — F0390 Unspecified dementia without behavioral disturbance: Secondary | ICD-10-CM | POA: Diagnosis not present

## 2020-07-30 DIAGNOSIS — Z85828 Personal history of other malignant neoplasm of skin: Secondary | ICD-10-CM | POA: Diagnosis not present

## 2020-07-30 DIAGNOSIS — I10 Essential (primary) hypertension: Secondary | ICD-10-CM | POA: Diagnosis not present

## 2020-07-30 DIAGNOSIS — R5383 Other fatigue: Secondary | ICD-10-CM | POA: Diagnosis present

## 2020-07-30 DIAGNOSIS — Z7901 Long term (current) use of anticoagulants: Secondary | ICD-10-CM | POA: Insufficient documentation

## 2020-07-30 DIAGNOSIS — U071 COVID-19: Secondary | ICD-10-CM | POA: Diagnosis not present

## 2020-07-30 LAB — COMPREHENSIVE METABOLIC PANEL
ALT: 16 U/L (ref 0–44)
AST: 23 U/L (ref 15–41)
Albumin: 3.8 g/dL (ref 3.5–5.0)
Alkaline Phosphatase: 61 U/L (ref 38–126)
Anion gap: 12 (ref 5–15)
BUN: 16 mg/dL (ref 8–23)
CO2: 25 mmol/L (ref 22–32)
Calcium: 9.6 mg/dL (ref 8.9–10.3)
Chloride: 100 mmol/L (ref 98–111)
Creatinine, Ser: 0.68 mg/dL (ref 0.44–1.00)
GFR, Estimated: >60 mL/min (ref >60)
Glucose, Bld: 90 mg/dL (ref 70–99)
Potassium: 4.1 mmol/L (ref 3.5–5.1)
Sodium: 137 mmol/L (ref 135–145)
Total Bilirubin: 1 mg/dL (ref 0.3–1.2)
Total Protein: 7 g/dL (ref 6.5–8.1)

## 2020-07-30 LAB — CBC WITH DIFFERENTIAL/PLATELET
Abs Immature Granulocytes: 0.01 10*3/uL (ref 0.00–0.07)
Basophils Absolute: 0 10*3/uL (ref 0.0–0.1)
Basophils Relative: 0 %
Eosinophils Absolute: 0 10*3/uL (ref 0.0–0.5)
Eosinophils Relative: 0 %
HCT: 45.5 % (ref 36.0–46.0)
Hemoglobin: 14.8 g/dL (ref 12.0–15.0)
Immature Granulocytes: 0 %
Lymphocytes Relative: 13 %
Lymphs Abs: 0.7 10*3/uL (ref 0.7–4.0)
MCH: 30.1 pg (ref 26.0–34.0)
MCHC: 32.5 g/dL (ref 30.0–36.0)
MCV: 92.7 fL (ref 80.0–100.0)
Monocytes Absolute: 0.6 10*3/uL (ref 0.1–1.0)
Monocytes Relative: 11 %
Neutro Abs: 3.7 10*3/uL (ref 1.7–7.7)
Neutrophils Relative %: 76 %
Platelets: 143 10*3/uL — ABNORMAL LOW (ref 150–400)
RBC: 4.91 MIL/uL (ref 3.87–5.11)
RDW: 12.6 % (ref 11.5–15.5)
WBC: 5 10*3/uL (ref 4.0–10.5)
nRBC: 0 % (ref 0.0–0.2)

## 2020-07-30 LAB — TROPONIN I (HIGH SENSITIVITY): Troponin I (High Sensitivity): 6 ng/L (ref <18)

## 2020-07-30 NOTE — Discharge Instructions (Addendum)
Your Chest X Ray today showed FINDINGS: Enlargement of cardiac silhouette.   Calcified tortuous thoracic aorta.   Mediastinal contours and pulmonary vascularity otherwise normal.   Bronchitic changes and pulmonary hyperinflation question COPD.   Scattered costal cartilaginous calcifications.   Question LEFT nipple shadow.   Mild chronic accentuation of LEFT upper lobe markings with question 7 mm LEFT upper lobe nodular density.   No acute infiltrate, pleural effusion, or pneumothorax.   Bones demineralized.   Surgical clips RIGHT axilla.

## 2020-07-30 NOTE — ED Triage Notes (Addendum)
Pt coming from home. Tested covid positive with a at home test kit today. Daughter states pt is more lethargic with some occasional coughs. Pt able to answer general questions per her normal cognitive function . All lung sounds clear  Hx. Dementia ,

## 2020-07-30 NOTE — ED Notes (Signed)
Called lab to confirm they had purple top- states they will run cbc

## 2020-07-30 NOTE — ED Provider Notes (Signed)
[facility] Emergency Department Provider Note  ____________________________________________   Event Date/Time   First MD Initiated Contact with Patient 07/30/20 1228     (approximate)  I have reviewed the triage vital signs and the nursing notes.   HISTORY  Chief Complaint Weakness (Per Daughter pt tested covid positive with a home test kit today , pt complains of mild generalized weakness, occasional cough, and daughter states pt is more confused than normal . )   HPI Anne Oneill is a 87 y.o. female with a past medical history of HTN, HDL, DVT on Eliquis and dementia who presents accompanied by her daughter for assessment of some increased fatigue over the last day.  Patient's daughter states they did take her home Covid test and the patient did test positive on his home test.  She was able to show me this and the result was positive.  Patient states that she feels tired but denies any other acute complaints.  She denies any headache, earache, sore throat, chest pain, cough, shortness of breath, nausea, vomiting, diarrhea, dysuria, rash or any recent falls or injuries.  Per patient's daughter she has not had any significant cough witnessed falls, vomiting, diarrhea or any other complaints.  No other acute concerns at this time.  Patient is fully vaccinated.         Past Medical History:  Diagnosis Date  . Arthritis   . Cancer ([place])   . Dementia ([place])    mild  . DVT (deep venous thrombosis) ([place])   . High cholesterol   . Hyperlipemia   . Osteoporosis   . Squamous cell carcinoma of skin 02/19/2007   R cheek    Patient Active Problem List   Diagnosis Date Noted  . Dyspepsia 01/04/2020  . Hiatal hernia 01/04/2020  . History of breast cancer 01/04/2020  . History of DVT (deep vein thrombosis) 01/04/2020  . History of TIA (transient ischemic attack) 01/04/2020  . Hyperlipidemia 01/04/2020  . Mitral valve prolapse 01/04/2020  . Osteoporosis  01/04/2020  . 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 Meningioma ([place]) 02/11/2018  . Mild cognitive impairment 12/11/2017    Past Surgical History:  Procedure Laterality Date  . ABDOMINAL HYSTERECTOMY    . EYE SURGERY    . FRACTURE SURGERY    . MASTECTOMY Right   . SKIN BIOPSY      Prior to Admission medications   Medication Sig Start Date End Date Taking? Authorizing Provider  Calcium Carbonate-Vit D-Min (CALCIUM 600+D PLUS MINERALS) 600-400 MG-UNIT TABS Take by mouth.    [provider]  Calcium Carbonate-Vitamin D 600-400 MG-UNIT tablet Take by mouth.    [provider]  cyanocobalamin 1000 MCG tablet Take by mouth.    [provider]  donepezil (ARICEPT) 10 MG tablet Take 10 mg by mouth daily. 10/26/19   [provider]  ELIQUIS 2.5 MG TABS tablet Take 2.5 mg by mouth 2 (two) times daily. 11/04/19   [provider]  escitalopram (LEXAPRO) 5 MG tablet Take 5 mg by mouth daily. 10/23/19   [provider]  mirabegron ER (MYRBETRIQ) 50 MG TB24 tablet Take 1 tablet (50 mg total) by mouth daily. 01/04/20   Bjorn Loser, MD  simvastatin (ZOCOR) 20 MG tablet Take 20 mg by mouth at bedtime. 09/20/19   [provider]    Allergies Codeine, Ditropan [oxybutynin], Hydrocodone, Morphine, Tramadol, and Latex  No family history on file.  Social History Social History  Tobacco Use  . Smoking status: Never Smoker  . Smokeless tobacco: Never Used  Substance Use Topics  . Alcohol use: Not Currently  . Drug use: Not Currently    Review of Systems  Review of Systems  Unable to perform ROS: Dementia  Constitutional: Positive for malaise/fatigue. Negative for chills and fever.  HENT: Negative for sore throat.   Eyes: Negative for pain.  Respiratory: Negative for cough and stridor.   Cardiovascular: Negative for chest pain.  Gastrointestinal: Negative for vomiting.  Genitourinary: Negative for  dysuria.  Musculoskeletal: Negative for myalgias.  Skin: Negative for rash.  Neurological: Negative for seizures, loss of consciousness and headaches.  Psychiatric/Behavioral: Positive for memory loss. Negative for suicidal ideas.  All other systems reviewed and are negative.     ____________________________________________   PHYSICAL EXAM:  VITAL SIGNS: ED Triage Vitals  Enc Vitals Group     BP 07/30/20 1139 (!) 156/86     Pulse Rate 07/30/20 1139 79     Resp 07/30/20 1139 18     Temp 07/30/20 1139 98.7 F (37.1 C)     Temp Source 07/30/20 1139 Oral     SpO2 07/30/20 1139 98 %     Weight 07/30/20 1151 95 lb 0.3 oz (43.1 kg)     Height 07/30/20 1151 5' (1.524 m)     Head Circumference --      Peak Flow --      Pain Score 07/30/20 1150 0     Pain Loc --      Pain Edu? --      Excl. in [place]? --    Vitals:   07/30/20 1139 07/30/20 1150  BP: (!) 156/86 (!) 156/86  Pulse: 79 78  Resp: 18 18  Temp: 98.7 F (37.1 C) 98.7 F (37.1 C)  SpO2: 98% 97%   Physical Exam Vitals and nursing note reviewed.  Constitutional:      General: She is not in acute distress.    Appearance: She is well-developed and well-nourished.  HENT:     Head: Normocephalic and atraumatic.     Right Ear: External ear normal.     Left Ear: External ear normal.     Nose: Nose normal.     Mouth/Throat:     Mouth: Mucous membranes are moist.  Eyes:     Conjunctiva/sclera: Conjunctivae normal.  Cardiovascular:     Rate and Rhythm: Normal rate and regular rhythm.     Heart sounds: No murmur heard.   Pulmonary:     Effort: Pulmonary effort is normal. No respiratory distress.     Breath sounds: Normal breath sounds.  Abdominal:     Palpations: Abdomen is soft.     Tenderness: There is no abdominal tenderness.  Musculoskeletal:        General: No edema.     Cervical back: Neck supple.  Skin:    General: Skin is warm and dry.     Capillary Refill: Capillary refill takes less than 2 seconds.   Neurological:     Mental Status: She is alert. Mental status is at baseline. She is disoriented and confused.  Psychiatric:        Mood and Affect: Mood and affect and mood normal.      ____________________________________________   LABS (all labs ordered are listed, but only abnormal results are displayed)  Labs Reviewed  CBC WITH DIFFERENTIAL/PLATELET - Abnormal; Notable for the following components:      Result Value   Platelets  143 (*)    All other components within normal limits  COMPREHENSIVE METABOLIC PANEL  TROPONIN I (HIGH SENSITIVITY)   ____________________________________________  EKG  Sinus rhythm with a ventricular rate of 82, normal axis, unremarkable intervals, incomplete right bundle branch block and no other clear evidence of acute ischemia or other significant underlying arrhythmia ____________________________________________  RADIOLOGY  ED MD interpretation: No focal consolidation, large effusion, significant edema, pneumothorax or any other clear acute intrathoracic process.  There is possible left upper lobe nodule.  Official radiology report(s): DG Chest Portable 1 View  Result Date: 07/30/2020 CLINICAL DATA:  Shortness of breath, tested positive for COVID-19 on home test CT today, increased lethargy and occasional coughing EXAM: PORTABLE CHEST 1 VIEW COMPARISON:  Portable exam 1234 hours compared to 02/24/2020 FINDINGS: Enlargement of cardiac silhouette. Calcified tortuous thoracic aorta. Mediastinal contours and pulmonary vascularity otherwise normal. Bronchitic changes and pulmonary hyperinflation question COPD. Scattered costal cartilaginous calcifications. Question LEFT nipple shadow. Mild chronic accentuation of LEFT upper lobe markings with question 7 mm LEFT upper lobe nodular density. No acute infiltrate, pleural effusion, or pneumothorax. Bones demineralized. Surgical clips RIGHT axilla. IMPRESSION: Bronchitic changes and hyperinflation question  COPD. Question LEFT nipple shadow. Chronic accentuation of LEFT upper lobe markings with questionable 7 mm LEFT upper lobe nodule; CT chest recommended for further assessment, if clinically indicated based on patient age and comorbidities. Electronically Signed   By: Lavonia Dana M.D.   On: 07/30/2020 12:53    ____________________________________________   PROCEDURES  Procedure(s) performed (including Critical Care):  Procedures   ____________________________________________   INITIAL IMPRESSION / ASSESSMENT AND PLAN / ED COURSE      Patient presents with above to history exam for assessment of some fatigue in the setting of positive home Covid test.  On arrival she is slight hypertensive otherwise stable vital signs on room air.  She denies any other acute complaints other than feeling a little fatigued.  Her lungs are clear bilaterally and her abdomen is soft.  She does not appear significantly dehydrated and she appears to be at her neurological baseline.  No history of recent trauma or injuries.  No findings of trauma on exam.  Patient does not appear septic or meningitic and have low suspicion for toxic ingestion.  I was able to view the home Covid test on patient's daughter's phone and confirmed it is positive.  Chest x-ray shows no evidence of pneumonia and patient is not hypoxic.  There is questionable nodule which I discussed with patient's daughter and advised that they can follow-up on a nonemergent basis with PCP who may arrange outpatient imaging.  CMP obtained today shows a K of 3.4 but no other significant electrolyte or metabolic derangements.  CBC shows no leukocytosis or acute anemia.  Troponin is nonelevated and given reassuring EKG a very low suspicion for ACS or myocarditis.  Given stable vitals with no evidence of hypoxia and patient stating she has no complaints other than little fatigue after recent positive home Covid test I believe she is safe for discharge with plan  for outpatient follow-up.  Discharged stable condition.  Strict precautions advised and discussed.       ____________________________________________   FINAL CLINICAL IMPRESSION(S) / ED DIAGNOSES  Final diagnoses:  COVID  Hypertension, unspecified type  Lung nodule    Medications - No data to display   ED Discharge Orders    None       Note:  This document was prepared using Dragon voice recognition software  and may include unintentional dictation errors.   Lucrezia Starch, MD 07/30/20 [phone]

## 2020-08-08 ENCOUNTER — Ambulatory Visit: Payer: Medicare Other | Admitting: Dermatology

## 2020-10-15 IMAGING — CT CT MAXILLOFACIAL W/O CM
3 series · 16 of 47 positions shown, 19 images · non-contrast
Comparison: 08/25/2019

CLINICAL DATA: Recent fall

EXAM:
CT HEAD WITHOUT CONTRAST
CT MAXILLOFACIAL WITHOUT CONTRAST
TECHNIQUE: Multidetector CT imaging of the head and maxillofacial structures
were performed using the standard protocol without intravenous
contrast. Multiplanar CT image reconstructions of the maxillofacial
structures were also generated.

[Series 2: max soft · axial · 0.33mm/px · z∈[-204,-76]mm · 10 of 76 slices shown, 13 images]
[im 6/76  brain]
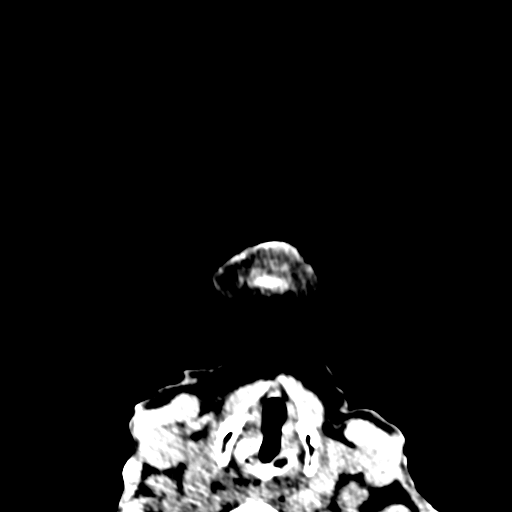
[im 6/76  bone]
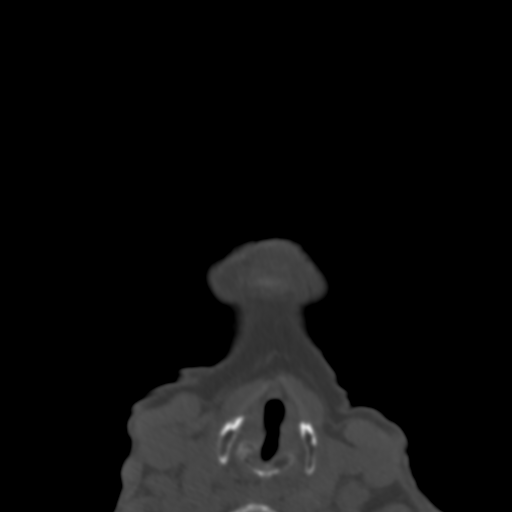
[im 13/76  bone]
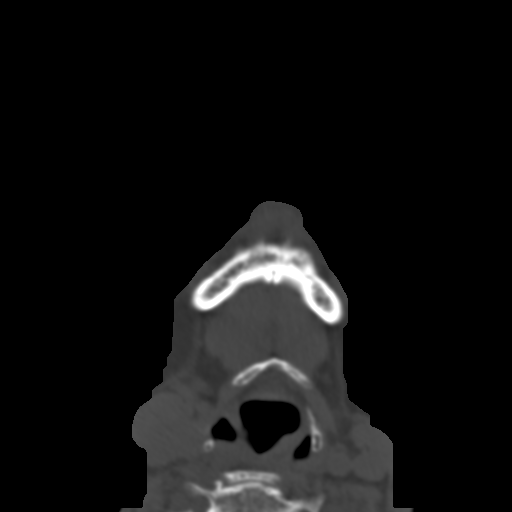
[im 21/76  bone]
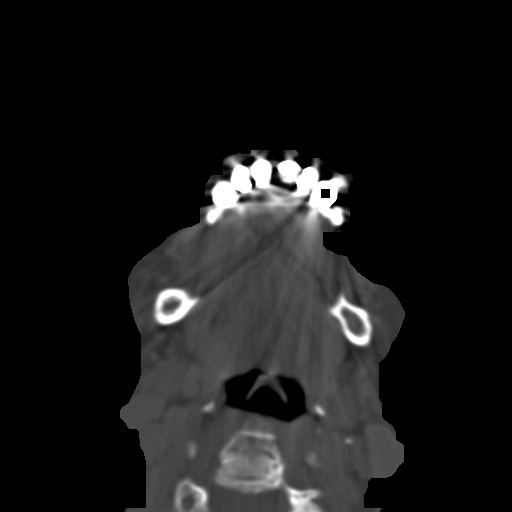
[im 26/76  bone]
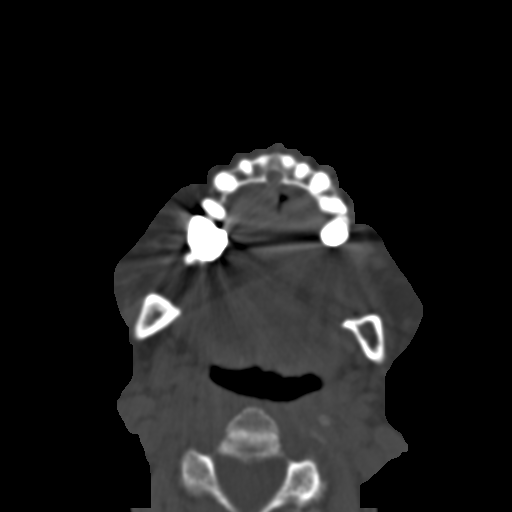
[im 34/76  brain]
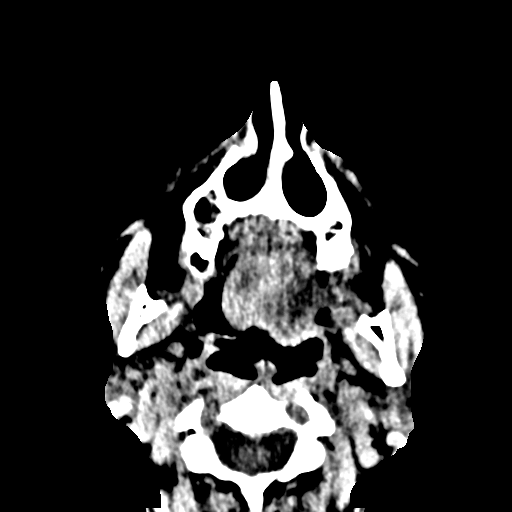
[im 34/76  bone]
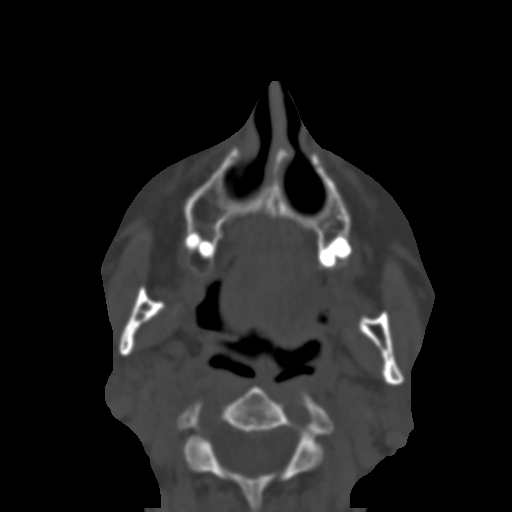
[im 42/76  bone]
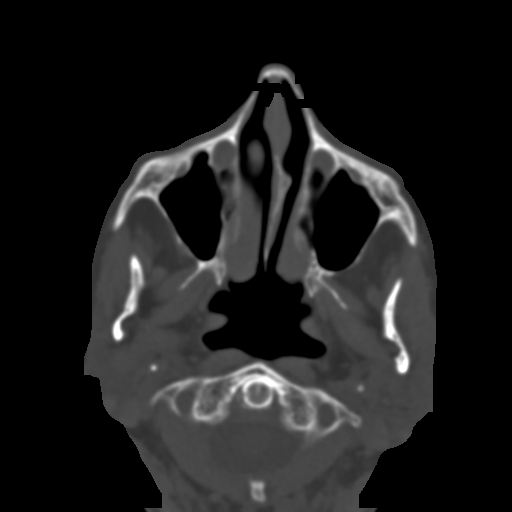
[im 50/76  bone]
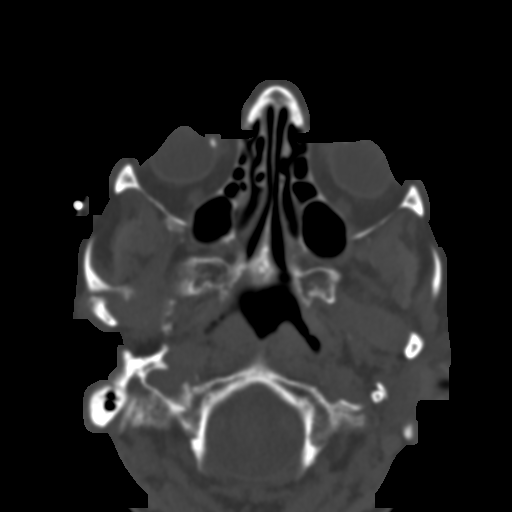
[im 57/76  bone]
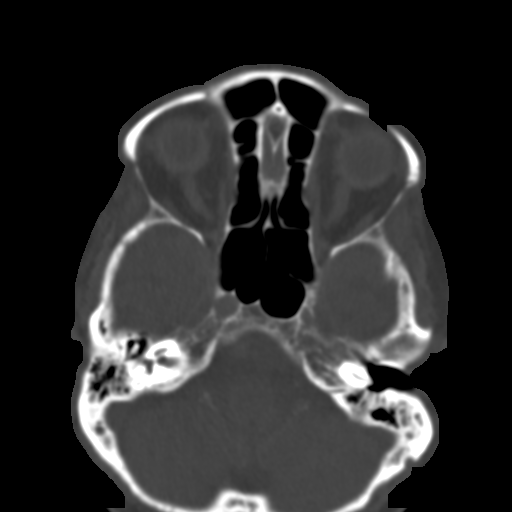
[im 63/76  brain]
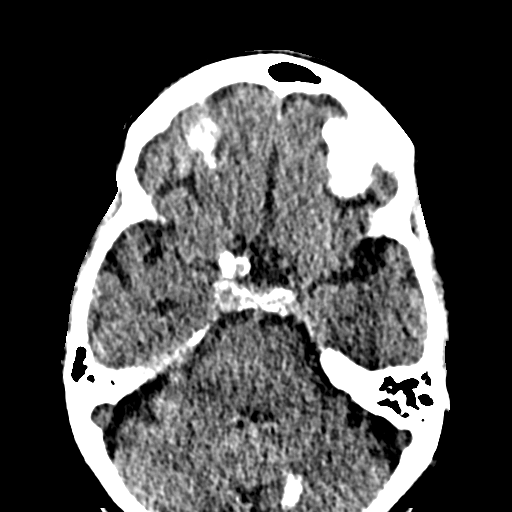
[im 63/76  bone]
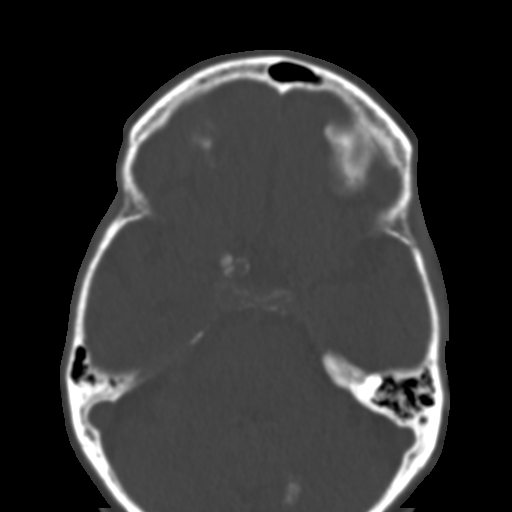
[im 70/76  bone]
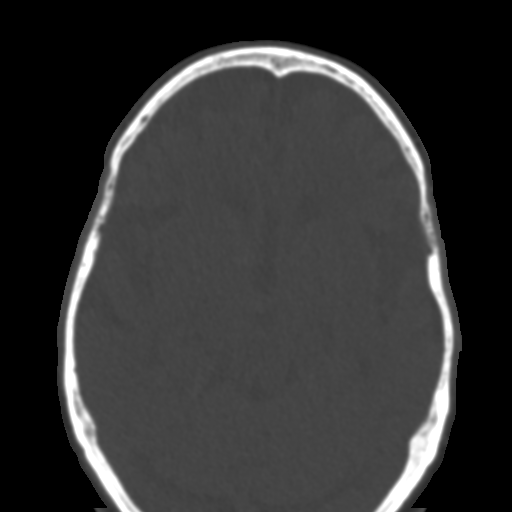

[Series 6: coronal soft · coronal · 0.34mm/px · 3 of 77 slices shown]
[im 26/77  bone]
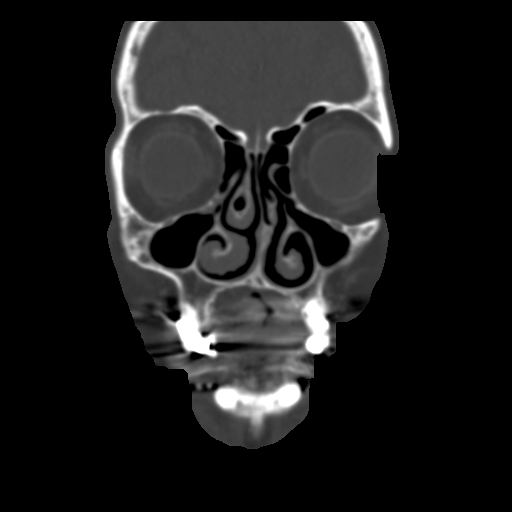
[im 34/77  bone]
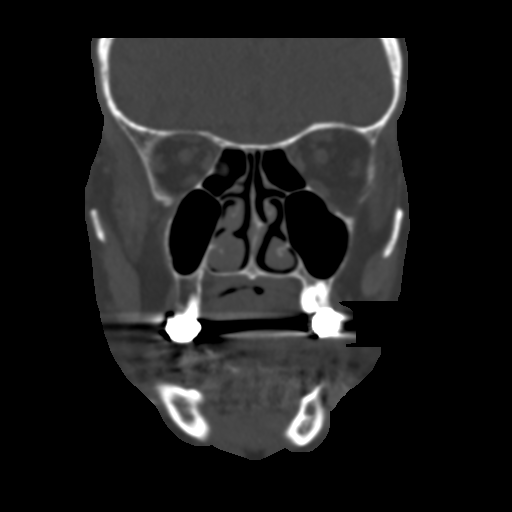
[im 43/77  bone]
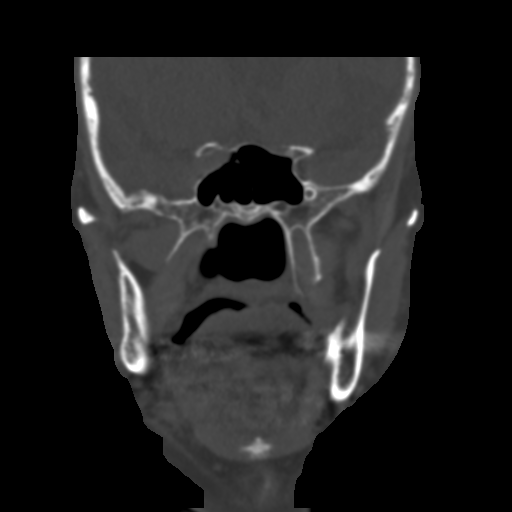

[Series 8: sagittal soft · sagittal · 0.30mm/px · 3 of 74 slices shown]
[im 25/74  bone]
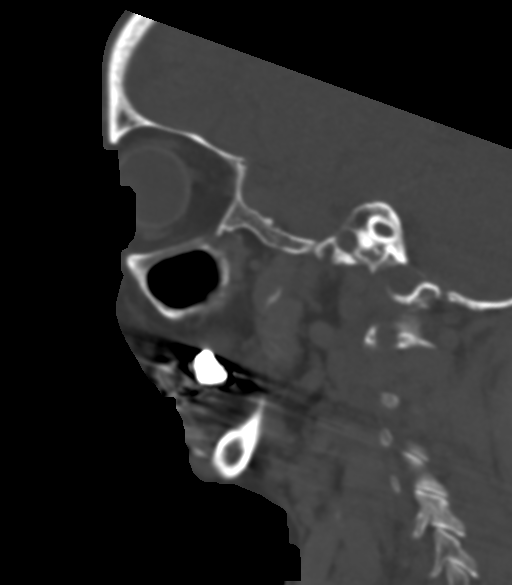
[im 37/74  bone]
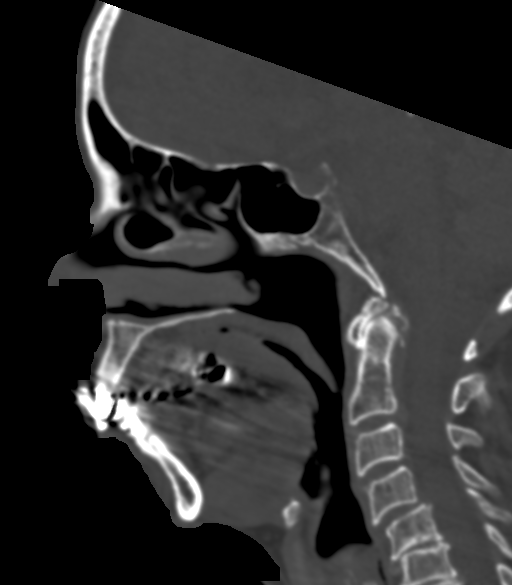
[im 49/74  bone]
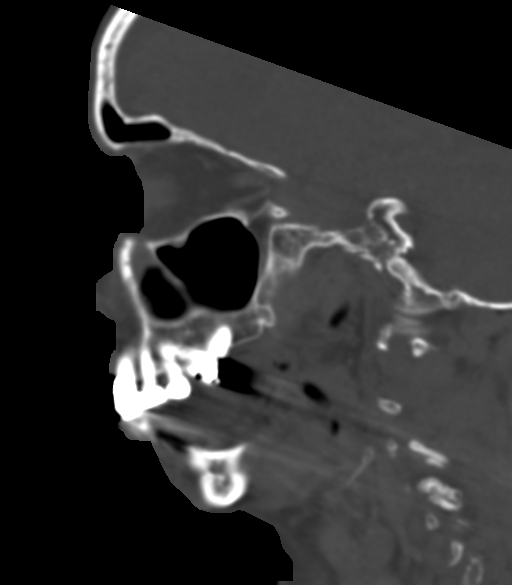

[16 of 47 positions shown; findings below may reference images not displayed]

FINDINGS: CT HEAD FINDINGS

Brain: No evidence of acute infarction, hemorrhage, hydrocephalus,
extra-axial collection or mass lesion/mass effect. Chronic atrophic
and ischemic changes are again identified and stable.

Vascular: No hyperdense vessel or unexpected calcification.

Skull: Normal. Negative for fracture or focal lesion.

Other: None.

CT MAXILLOFACIAL FINDINGS

Osseous: No acute bony abnormality is noted. Degenerative changes of
the temporomandibular joints are noted.

Orbits: Orbits and their contents are within normal limits.
Postsurgical changes are seen.

Sinuses: Clear.

Soft tissues: Surrounding soft tissue structures show no focal
hematoma. No focal area of soft tissue swelling is noted.
IMPRESSION: CT of the head: Chronic atrophic and ischemic changes without acute
abnormality.

CT of the maxillofacial bones: No acute bony abnormality is noted.

## 2020-10-31 ENCOUNTER — Ambulatory Visit (INDEPENDENT_AMBULATORY_CARE_PROVIDER_SITE_OTHER): Payer: Medicare Other | Admitting: Dermatology

## 2020-10-31 ENCOUNTER — Other Ambulatory Visit: Payer: Self-pay

## 2020-10-31 DIAGNOSIS — Z1283 Encounter for screening for malignant neoplasm of skin: Secondary | ICD-10-CM

## 2020-10-31 DIAGNOSIS — D1800 Hemangioma unspecified site: Secondary | ICD-10-CM

## 2020-10-31 DIAGNOSIS — L814 Other melanin hyperpigmentation: Secondary | ICD-10-CM

## 2020-10-31 DIAGNOSIS — Z85828 Personal history of other malignant neoplasm of skin: Secondary | ICD-10-CM | POA: Diagnosis not present

## 2020-10-31 DIAGNOSIS — L219 Seborrheic dermatitis, unspecified: Secondary | ICD-10-CM

## 2020-10-31 DIAGNOSIS — D229 Melanocytic nevi, unspecified: Secondary | ICD-10-CM

## 2020-10-31 DIAGNOSIS — L820 Inflamed seborrheic keratosis: Secondary | ICD-10-CM

## 2020-10-31 DIAGNOSIS — I781 Nevus, non-neoplastic: Secondary | ICD-10-CM | POA: Diagnosis not present

## 2020-10-31 DIAGNOSIS — L578 Other skin changes due to chronic exposure to nonionizing radiation: Secondary | ICD-10-CM

## 2020-10-31 DIAGNOSIS — L821 Other seborrheic keratosis: Secondary | ICD-10-CM

## 2020-10-31 NOTE — Patient Instructions (Addendum)

## 2020-10-31 NOTE — Progress Notes (Signed)
Follow-Up Visit   Subjective  Anne Oneill is a 87 y.o. female who presents for the following: Total body skin exam (Hx of SCC R cheek). Couple itchy spots on back and scalp.  Also posterior neck.  Patient accompanied by daughter who contributes to history.  The following portions of the chart were reviewed this encounter and updated as appropriate:       Review of Systems:  No other skin or systemic complaints except as noted in HPI or Assessment and Plan.  Objective  Well appearing patient in no apparent distress; mood and affect are within normal limits.  A full examination was performed including scalp, head, eyes, ears, nose, lips, neck, chest, axillae, abdomen, back, buttocks, bilateral upper extremities, bilateral lower extremities, hands, feet, fingers, toes, fingernails, and toenails. All findings within normal limits unless otherwise noted below.  Objective  R cheek: Well healed scar with no evidence of recurrence  Objective  spinal lower back x 1, vertex scalp x 1 (2): Erythematous keratotic or waxy stuck-on papule  Objective  bil lower legs: Telangiectasias bil lower legs  Objective  Scalp: Mild scale scalp   Assessment & Plan    Lentigines - Scattered tan macules - Due to sun exposure - Benign-appering, observe - Recommend daily broad spectrum sunscreen SPF 30+ to sun-exposed areas, reapply every 2 hours as needed. - Call for any changes  Seborrheic Keratoses - Stuck-on, waxy, tan-brown papules and/or plaques  - Benign-appearing - Discussed benign etiology and prognosis. - Observe - Call for any changes  Melanocytic Nevi - Tan-brown and/or pink-flesh-colored symmetric macules and papules - Benign appearing on exam today - Observation - Call clinic for new or changing moles - Recommend daily use of broad spectrum spf 30+ sunscreen to sun-exposed areas.   Hemangiomas - Red papules - Discussed benign nature - Observe - Call for any  changes  Actinic Damage - Chronic condition, secondary to cumulative UV/sun exposure - diffuse scaly erythematous macules with underlying dyspigmentation - Recommend daily broad spectrum sunscreen SPF 30+ to sun-exposed areas, reapply every 2 hours as needed.  - Staying in the shade or wearing long sleeves, sun glasses (UVA+UVB protection) and wide brim hats (4-inch brim around the entire circumference of the hat) are also recommended for sun protection.  - Call for new or changing lesions.  Skin cancer screening performed today.   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 Observe for recurrence. Call clinic for new or changing lesions.  Recommend regular skin exams, daily broad-spectrum spf 30+ sunscreen use, and photoprotection.     Inflamed seborrheic keratosis (2) spinal lower back x 1, vertex scalp x 1  Prior to procedure, discussed risks of blister formation, small wound, skin dyspigmentation, or rare scar following cryotherapy.    Daughter defers cryotherapy to posterior neck  Destruction of lesion - spinal lower back x 1, vertex scalp x 1  Destruction method: cryotherapy   Informed consent: discussed and consent obtained   Lesion destroyed using liquid nitrogen: Yes   Region frozen until ice ball extended beyond lesion: Yes   Outcome: patient tolerated procedure well with no complications   Post-procedure details: wound care instructions given    Spider veins bil lower legs  Benign, observe  Seborrheic dermatitis Scalp  With Pruritus   Discussed Ketoconazole 2% shampoo, daughter defers at this time and will call if needs a prescription.  She will use OTC medicated shampoo for now.  Return if symptoms worsen  or fail to improve.   I, Othelia Pulling, RMA, am acting as scribe for Brendolyn Patty, MD .  Documentation: I have reviewed the above documentation for accuracy and completeness, and I agree with the above.  Brendolyn Patty MD

## 2021-01-09 ENCOUNTER — Ambulatory Visit: Payer: Self-pay | Admitting: Urology

## 2021-02-22 ENCOUNTER — Other Ambulatory Visit: Payer: Self-pay | Admitting: Internal Medicine

## 2021-02-22 DIAGNOSIS — R1312 Dysphagia, oropharyngeal phase: Secondary | ICD-10-CM

## 2021-02-22 DIAGNOSIS — R633 Feeding difficulties, unspecified: Secondary | ICD-10-CM

## 2021-03-17 ENCOUNTER — Other Ambulatory Visit: Payer: Self-pay

## 2021-03-17 ENCOUNTER — Ambulatory Visit
Admission: RE | Admit: 2021-03-17 | Discharge: 2021-03-17 | Disposition: A | Discharge status: Home / Self Care | Payer: Medicare Other | Source: Ambulatory Visit | Attending: Internal Medicine | Admitting: Internal Medicine

## 2021-03-17 DIAGNOSIS — R1312 Dysphagia, oropharyngeal phase: Secondary | ICD-10-CM | POA: Insufficient documentation

## 2021-03-17 DIAGNOSIS — R633 Feeding difficulties, unspecified: Secondary | ICD-10-CM | POA: Insufficient documentation

## 2021-03-17 NOTE — Progress Notes (Signed)
Modified Barium Swallow Progress Note  Patient Details  Name: Anne Oneill MRN: [id number] Date of Birth: 1926-09-25  Today's Date: 03/17/2021  Modified Barium Swallow completed.  Full report located under Chart Review in the Imaging Section.  Brief recommendations include the following:  Clinical Impression  Pt presents with adequate oropharyngeal abilities when consuming puree, graham cracker, thin liquids via cup, straw, whole barium tablet with thin liquids via straw. Pt's oral phase, pharyngeal phase and cervical esophageal phase were all functional with good airway protection achieved even with large consecutive sips. Of note, pt has several areas of possible calcification on imaging that are present prior to barium trials. These areas are not indicative of barium. At this time, recommend pt follow general aspiration precautions when consuming age appropriate regular textures and thin liquids via cup or straw, medicine whole with thin liquids. Education on results of study were shared with pt and her daughter.   Swallow Evaluation Recommendations       SLP Diet Recommendations: Regular solids;Thin liquid   Liquid Administration via: Cup;Straw   Medication Administration: Whole meds with liquid   Supervision: Patient able to self feed   Compensations: Minimize environmental distractions;Slow rate;Small sips/bites   Postural Changes: Remain semi-upright after after feeds/meals (Comment);Seated upright at 90 degrees   Oral Care Recommendations: Oral care BID      Alamin Mccuiston B. Rutherford Nail M.S., CCC-SLP, [place] [facility] [phone]   Shani Fitch 03/17/2021,1:28 PM

## 2021-03-24 ENCOUNTER — Ambulatory Visit: Payer: Medicare Other

## 2021-04-12 ENCOUNTER — Other Ambulatory Visit: Payer: Self-pay

## 2021-04-12 ENCOUNTER — Emergency Department
Admission: EM | Admit: 2021-04-12 | Discharge: 2021-04-12 | Disposition: A | Discharge status: Home / Self Care | Payer: Medicare Other | Attending: Emergency Medicine | Admitting: Emergency Medicine

## 2021-04-12 DIAGNOSIS — Z5321 Procedure and treatment not carried out due to patient leaving prior to being seen by health care provider: Secondary | ICD-10-CM | POA: Insufficient documentation

## 2021-04-12 DIAGNOSIS — W01198A Fall on same level from slipping, tripping and stumbling with subsequent striking against other object, initial encounter: Secondary | ICD-10-CM | POA: Insufficient documentation

## 2021-04-12 DIAGNOSIS — Z7901 Long term (current) use of anticoagulants: Secondary | ICD-10-CM | POA: Diagnosis not present

## 2021-04-12 DIAGNOSIS — R42 Dizziness and giddiness: Secondary | ICD-10-CM

## 2021-04-12 DIAGNOSIS — W19XXXA Unspecified fall, initial encounter: Secondary | ICD-10-CM

## 2021-04-12 LAB — BASIC METABOLIC PANEL
Anion gap: 7 (ref 5–15)
BUN: 16 mg/dL (ref 8–23)
CO2: 29 mmol/L (ref 22–32)
Calcium: 8.8 mg/dL — ABNORMAL LOW (ref 8.9–10.3)
Chloride: 103 mmol/L (ref 98–111)
Creatinine, Ser: 0.8 mg/dL (ref 0.44–1.00)
GFR, Estimated: >60 mL/min (ref >60)
Glucose, Bld: 94 mg/dL (ref 70–99)
Potassium: 4.1 mmol/L (ref 3.5–5.1)
Sodium: 139 mmol/L (ref 135–145)

## 2021-04-12 LAB — CBC
HCT: 43.2 % (ref 36.0–46.0)
Hemoglobin: 14 g/dL (ref 12.0–15.0)
MCH: 30.4 pg (ref 26.0–34.0)
MCHC: 32.4 g/dL (ref 30.0–36.0)
MCV: 93.7 fL (ref 80.0–100.0)
Platelets: 177 10*3/uL (ref 150–400)
RBC: 4.61 MIL/uL (ref 3.87–5.11)
RDW: 12.4 % (ref 11.5–15.5)
WBC: 4.9 10*3/uL (ref 4.0–10.5)
nRBC: 0 % (ref 0.0–0.2)

## 2021-04-12 LAB — PROTIME-INR
INR: 1 (ref 0.8–1.2)
Prothrombin Time: 13 seconds (ref 11.4–15.2)

## 2021-04-12 LAB — TROPONIN I (HIGH SENSITIVITY): Troponin I (High Sensitivity): 8 ng/L (ref <18)

## 2021-04-12 NOTE — ED Triage Notes (Signed)
Pt comes into the ED via EMS from home, states she slipped and fell today .. pt daughter states she has been having dizzy spells, states she fell last week and broke her ribs due to dizziness.   173/80 60 HR 98%RA

## 2021-04-12 NOTE — ED Provider Notes (Signed)
Emergency Medicine Provider Triage Evaluation Note  Anne Oneill , a 87 y.o. female  was evaluated in triage.  Pt complains of dizziness, fall.  Around 4 PM today, patient fell in the living room when she was attempting to close the blinds.  Dizzy and suffered a witnessed fall, caretaker states she fell onto her bottom and patient and caretaker deny patient hitting her head or losing consciousness.  There is been no nausea or vomiting or worsening confusion.  Patient does have some mild dementia.  Daughter states she is at baseline.  Patient denies any headache, neck pain, back pain, hip pain.  Every time she stands she gets dizzy.  Daughter is concerned about dizziness.  Patient denies any chest pain or shortness of breath.  Review of Systems  Positive: Dizziness, fall Negative: Joint pain, arthralgias, fevers, chest pain, shortness of breath  Physical Exam  BP (!) 224/70 (BP Location: Right Arm)   Pulse 70   Temp 98.6 F (37 C) (Oral)   Resp 20   SpO2 98%  Gen:   Awake, no distress alert and oriented x3 Resp:  Normal effort  MSK:   Moves extremities without difficulty.  Nontender throughout the cervical thoracic or lumbar spinous process.  Normal range of motion of both hips with no discomfort.  Nontender lumbosacral coccyx region with palpation. Other:    Medical Decision Making  Medically screening exam initiated at 5:52 PM.  Appropriate orders placed.  Anne Oneill was informed that the remainder of the evaluation will be completed by another provider, this initial triage assessment does not replace that evaluation, and the importance of remaining in the ED until their evaluation is complete.  87 year old female with dizziness.  Dizziness has been persistent since a fall that occurred around 4 PM today.  Anytime patient stands she feels very dizzy.  Blood work, urine and EKG obtained.  We will continue to monitor.   Duanne Guess, PA-C 04/12/21 Ervin Knack,  MD 04/13/21 [phone]

## 2021-06-28 IMAGING — DX DG CHEST 1V PORT
1 series · 1 of 1 positions shown · non-contrast
Comparison: Portable exam 3377 hours compared to 02/24/2020

CLINICAL DATA: Shortness of breath, tested positive for 2LSOC-HA on
home test CT today, increased lethargy and occasional coughing

EXAM:
PORTABLE CHEST 1 VIEW

[chest ap]
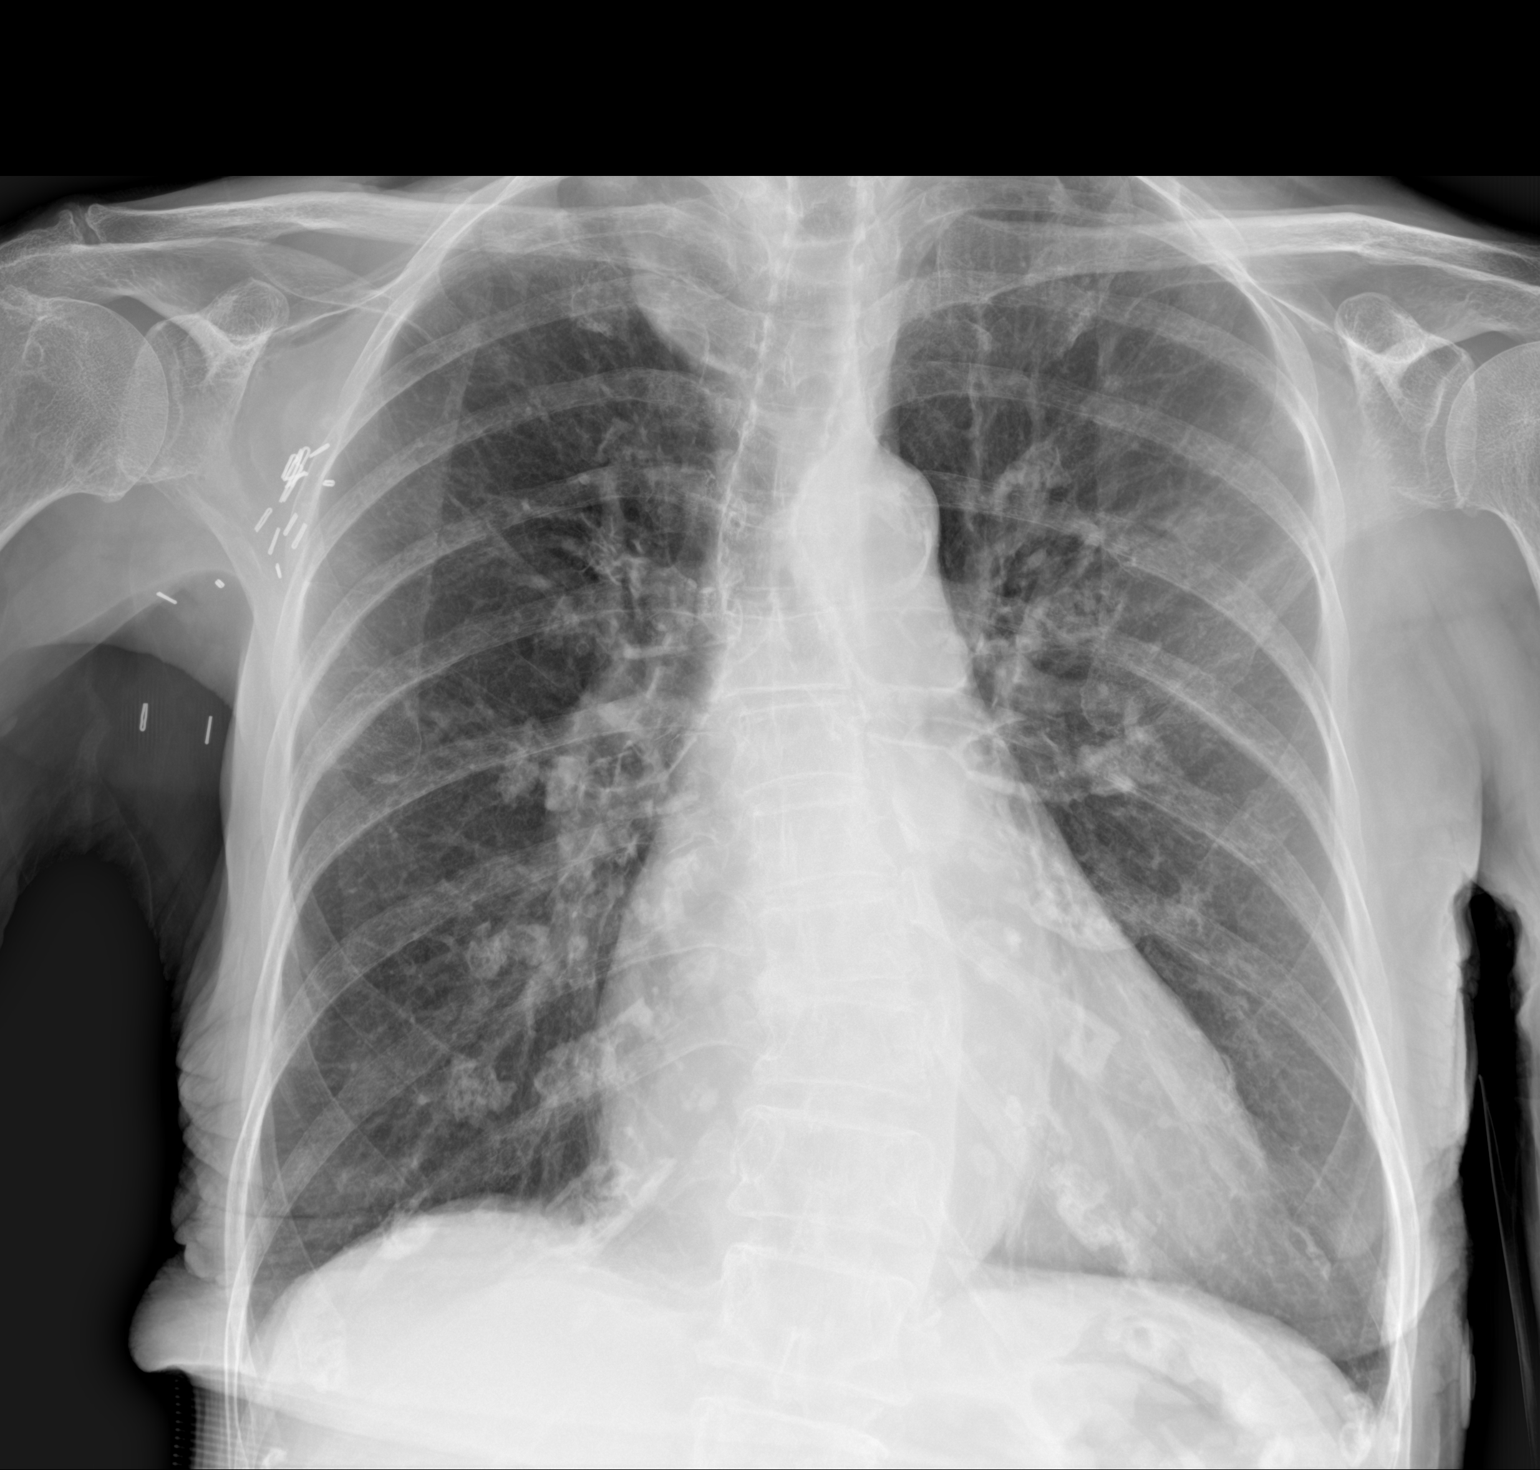

[1 of 1 positions shown; findings below may reference images not displayed]

FINDINGS: Enlargement of cardiac silhouette.

Calcified tortuous thoracic aorta.

Mediastinal contours and pulmonary vascularity otherwise normal.

Bronchitic changes and pulmonary hyperinflation question COPD.

Scattered costal cartilaginous calcifications.

Question LEFT nipple shadow.

Mild chronic accentuation of LEFT upper lobe markings with question
7 mm LEFT upper lobe nodular density.

No acute infiltrate, pleural effusion, or pneumothorax.

Bones demineralized.

Surgical clips RIGHT axilla.
IMPRESSION: Bronchitic changes and hyperinflation question COPD.

Question LEFT nipple shadow.

Chronic accentuation of LEFT upper lobe markings with questionable 7
mm LEFT upper lobe nodule; CT chest recommended for further
assessment, if clinically indicated based on patient age and
comorbidities.

## 2022-02-23 ENCOUNTER — Encounter: Payer: Self-pay | Admitting: Medical Oncology

## 2022-02-23 ENCOUNTER — Emergency Department: Payer: Medicare Other

## 2022-02-23 ENCOUNTER — Inpatient Hospital Stay
Admission: EM | Admit: 2022-02-23 | Discharge: 2022-02-27 | DRG: 536 | Disposition: A | Discharge status: Skilled Nursing Facility | Payer: Medicare Other | Attending: Internal Medicine | Admitting: Internal Medicine

## 2022-02-23 DIAGNOSIS — R7301 Impaired fasting glucose: Secondary | ICD-10-CM | POA: Diagnosis present

## 2022-02-23 DIAGNOSIS — Z79899 Other long term (current) drug therapy: Secondary | ICD-10-CM

## 2022-02-23 DIAGNOSIS — S32119A Unspecified Zone I fracture of sacrum, initial encounter for closed fracture: Secondary | ICD-10-CM | POA: Diagnosis present

## 2022-02-23 DIAGNOSIS — E7800 Pure hypercholesterolemia, unspecified: Secondary | ICD-10-CM | POA: Diagnosis present

## 2022-02-23 DIAGNOSIS — Z90710 Acquired absence of both cervix and uterus: Secondary | ICD-10-CM

## 2022-02-23 DIAGNOSIS — F0150 Vascular dementia without behavioral disturbance: Secondary | ICD-10-CM | POA: Diagnosis present

## 2022-02-23 DIAGNOSIS — R102 Pelvic and perineal pain: Secondary | ICD-10-CM | POA: Diagnosis not present

## 2022-02-23 DIAGNOSIS — Z86718 Personal history of other venous thrombosis and embolism: Secondary | ICD-10-CM

## 2022-02-23 DIAGNOSIS — M810 Age-related osteoporosis without current pathological fracture: Secondary | ICD-10-CM | POA: Diagnosis present

## 2022-02-23 DIAGNOSIS — Z853 Personal history of malignant neoplasm of breast: Secondary | ICD-10-CM

## 2022-02-23 DIAGNOSIS — S32810S Multiple fractures of pelvis with stable disruption of pelvic ring, sequela: Secondary | ICD-10-CM

## 2022-02-23 DIAGNOSIS — S32058A Other fracture of fifth lumbar vertebra, initial encounter for closed fracture: Secondary | ICD-10-CM | POA: Diagnosis present

## 2022-02-23 DIAGNOSIS — M1990 Unspecified osteoarthritis, unspecified site: Secondary | ICD-10-CM | POA: Diagnosis present

## 2022-02-23 DIAGNOSIS — Z888 Allergy status to other drugs, medicaments and biological substances status: Secondary | ICD-10-CM

## 2022-02-23 DIAGNOSIS — F0280 Dementia in other diseases classified elsewhere without behavioral disturbance: Secondary | ICD-10-CM | POA: Diagnosis present

## 2022-02-23 DIAGNOSIS — R296 Repeated falls: Secondary | ICD-10-CM | POA: Diagnosis present

## 2022-02-23 DIAGNOSIS — Z7901 Long term (current) use of anticoagulants: Secondary | ICD-10-CM

## 2022-02-23 DIAGNOSIS — M8000XS Age-related osteoporosis with current pathological fracture, unspecified site, sequela: Secondary | ICD-10-CM | POA: Diagnosis not present

## 2022-02-23 DIAGNOSIS — S329XXA Fracture of unspecified parts of lumbosacral spine and pelvis, initial encounter for closed fracture: Principal | ICD-10-CM

## 2022-02-23 DIAGNOSIS — S32810A Multiple fractures of pelvis with stable disruption of pelvic ring, initial encounter for closed fracture: Secondary | ICD-10-CM | POA: Diagnosis present

## 2022-02-23 DIAGNOSIS — Z66 Do not resuscitate: Secondary | ICD-10-CM | POA: Diagnosis present

## 2022-02-23 DIAGNOSIS — D696 Thrombocytopenia, unspecified: Secondary | ICD-10-CM | POA: Diagnosis present

## 2022-02-23 DIAGNOSIS — Z86011 Personal history of benign neoplasm of the brain: Secondary | ICD-10-CM | POA: Diagnosis not present

## 2022-02-23 DIAGNOSIS — S3210XA Unspecified fracture of sacrum, initial encounter for closed fracture: Secondary | ICD-10-CM

## 2022-02-23 DIAGNOSIS — I160 Hypertensive urgency: Secondary | ICD-10-CM

## 2022-02-23 DIAGNOSIS — R710 Precipitous drop in hematocrit: Secondary | ICD-10-CM | POA: Diagnosis present

## 2022-02-23 DIAGNOSIS — Z9011 Acquired absence of right breast and nipple: Secondary | ICD-10-CM

## 2022-02-23 DIAGNOSIS — S32492A Other specified fracture of left acetabulum, initial encounter for closed fracture: Secondary | ICD-10-CM | POA: Diagnosis present

## 2022-02-23 DIAGNOSIS — W2201XA Walked into wall, initial encounter: Secondary | ICD-10-CM | POA: Diagnosis present

## 2022-02-23 DIAGNOSIS — Z85828 Personal history of other malignant neoplasm of skin: Secondary | ICD-10-CM

## 2022-02-23 DIAGNOSIS — Z91040 Latex allergy status: Secondary | ICD-10-CM

## 2022-02-23 DIAGNOSIS — Z885 Allergy status to narcotic agent status: Secondary | ICD-10-CM

## 2022-02-23 DIAGNOSIS — S3210XS Unspecified fracture of sacrum, sequela: Secondary | ICD-10-CM | POA: Diagnosis not present

## 2022-02-23 DIAGNOSIS — G309 Alzheimer's disease, unspecified: Secondary | ICD-10-CM | POA: Diagnosis present

## 2022-02-23 DIAGNOSIS — Z8673 Personal history of transient ischemic attack (TIA), and cerebral infarction without residual deficits: Secondary | ICD-10-CM

## 2022-02-23 LAB — CBC WITH DIFFERENTIAL/PLATELET
Abs Immature Granulocytes: 0.06 10*3/uL (ref 0.00–0.07)
Basophils Absolute: 0 10*3/uL (ref 0.0–0.1)
Basophils Relative: 0 %
Eosinophils Absolute: 0 10*3/uL (ref 0.0–0.5)
Eosinophils Relative: 0 %
HCT: 43 % (ref 36.0–46.0)
Hemoglobin: 13.9 g/dL (ref 12.0–15.0)
Immature Granulocytes: 1 %
Lymphocytes Relative: 5 %
Lymphs Abs: 0.6 10*3/uL — ABNORMAL LOW (ref 0.7–4.0)
MCH: 29.8 pg (ref 26.0–34.0)
MCHC: 32.3 g/dL (ref 30.0–36.0)
MCV: 92.1 fL (ref 80.0–100.0)
Monocytes Absolute: 0.5 10*3/uL (ref 0.1–1.0)
Monocytes Relative: 4 %
Neutro Abs: 11.1 10*3/uL — ABNORMAL HIGH (ref 1.7–7.7)
Neutrophils Relative %: 90 %
Platelets: 155 10*3/uL (ref 150–400)
RBC: 4.67 MIL/uL (ref 3.87–5.11)
RDW: 12.2 % (ref 11.5–15.5)
WBC: 12.2 10*3/uL — ABNORMAL HIGH (ref 4.0–10.5)
nRBC: 0 % (ref 0.0–0.2)

## 2022-02-23 LAB — BASIC METABOLIC PANEL
Anion gap: 5 (ref 5–15)
BUN: 16 mg/dL (ref 8–23)
CO2: 29 mmol/L (ref 22–32)
Calcium: 9.1 mg/dL (ref 8.9–10.3)
Chloride: 105 mmol/L (ref 98–111)
Creatinine, Ser: 0.85 mg/dL (ref 0.44–1.00)
GFR, Estimated: >60 mL/min (ref >60)
Glucose, Bld: 169 mg/dL — ABNORMAL HIGH (ref 70–99)
Potassium: 3.8 mmol/L (ref 3.5–5.1)
Sodium: 139 mmol/L (ref 135–145)

## 2022-02-23 MED ORDER — MEMANTINE HCL 5 MG PO TABS
10.0000 mg | ORAL_TABLET | Freq: Two times a day (BID) | ORAL | Status: DC
  Administered 2022-02-23 – 2022-02-27 (×8): 10 mg via ORAL
  Filled 2022-02-23 (×8): qty 2

## 2022-02-23 MED ORDER — DONEPEZIL HCL 5 MG PO TABS
10.0000 mg | ORAL_TABLET | Freq: Every day | ORAL | Status: DC
  Filled 2022-02-23: qty 2

## 2022-02-23 MED ORDER — ACETAMINOPHEN 325 MG PO TABS
650.0000 mg | ORAL_TABLET | Freq: Four times a day (QID) | ORAL | Status: DC | PRN
  Administered 2022-02-24 – 2022-02-27 (×6): 650 mg via ORAL
  Filled 2022-02-23 (×7): qty 2

## 2022-02-23 MED ORDER — FENTANYL CITRATE PF 50 MCG/ML IJ SOSY
50.0000 ug | PREFILLED_SYRINGE | Freq: Once | INTRAMUSCULAR | Status: AC
  Administered 2022-02-23: 50 ug via INTRAVENOUS
  Filled 2022-02-23 (×2): qty 1

## 2022-02-23 MED ORDER — ONDANSETRON HCL 4 MG PO TABS: 4.0000 mg | ORAL_TABLET | Freq: Four times a day (QID) | ORAL | Status: DC | PRN

## 2022-02-23 MED ORDER — HYDRALAZINE HCL 20 MG/ML IJ SOLN: 5.0000 mg | Freq: Four times a day (QID) | INTRAMUSCULAR | Status: DC | PRN

## 2022-02-23 MED ORDER — ACETAMINOPHEN 650 MG RE SUPP: 650.0000 mg | Freq: Four times a day (QID) | RECTAL | Status: DC | PRN

## 2022-02-23 MED ORDER — APIXABAN 2.5 MG PO TABS
2.5000 mg | ORAL_TABLET | Freq: Two times a day (BID) | ORAL | Status: DC
  Administered 2022-02-23 – 2022-02-27 (×8): 2.5 mg via ORAL
  Filled 2022-02-23 (×9): qty 1

## 2022-02-23 MED ORDER — FENTANYL CITRATE PF 50 MCG/ML IJ SOSY
50.0000 ug | PREFILLED_SYRINGE | Freq: Once | INTRAMUSCULAR | Status: AC
  Administered 2022-02-23: 50 ug via INTRAVENOUS
  Filled 2022-02-23: qty 1

## 2022-02-23 MED ORDER — HYDROMORPHONE HCL 1 MG/ML IJ SOLN
0.5000 mg | INTRAMUSCULAR | Status: DC | PRN
  Filled 2022-02-23: qty 1

## 2022-02-23 MED ORDER — DONEPEZIL HCL 5 MG PO TABS
10.0000 mg | ORAL_TABLET | Freq: Every day | ORAL | Status: DC
  Administered 2022-02-24 – 2022-02-27 (×4): 10 mg via ORAL
  Filled 2022-02-23 (×4): qty 2

## 2022-02-23 MED ORDER — OCUVITE-LUTEIN PO CAPS
1.0000 | ORAL_CAPSULE | Freq: Two times a day (BID) | ORAL | Status: DC
  Administered 2022-02-24 – 2022-02-27 (×6): 1 via ORAL
  Filled 2022-02-23 (×8): qty 1

## 2022-02-23 MED ORDER — SIMVASTATIN 20 MG PO TABS
20.0000 mg | ORAL_TABLET | Freq: Every day | ORAL | Status: DC
  Administered 2022-02-23 – 2022-02-26 (×4): 20 mg via ORAL
  Filled 2022-02-23 (×4): qty 1

## 2022-02-23 MED ORDER — HALOPERIDOL LACTATE 5 MG/ML IJ SOLN: 0.5000 mg | Freq: Four times a day (QID) | INTRAMUSCULAR | Status: DC | PRN

## 2022-02-23 MED ORDER — ONDANSETRON HCL 4 MG/2ML IJ SOLN: 4.0000 mg | Freq: Four times a day (QID) | INTRAMUSCULAR | Status: DC | PRN

## 2022-02-23 MED ORDER — MIRABEGRON ER 50 MG PO TB24
50.0000 mg | ORAL_TABLET | Freq: Every day | ORAL | Status: DC
  Administered 2022-02-24 – 2022-02-25 (×2): 50 mg via ORAL
  Filled 2022-02-23 (×3): qty 1

## 2022-02-23 MED ORDER — VITAMIN B-12 1000 MCG PO TABS
1000.0000 ug | ORAL_TABLET | Freq: Every day | ORAL | Status: DC
  Administered 2022-02-24 – 2022-02-27 (×4): 1000 ug via ORAL
  Filled 2022-02-23 (×4): qty 1

## 2022-02-23 MED ORDER — PROCHLORPERAZINE EDISYLATE 10 MG/2ML IJ SOLN: 5.0000 mg | INTRAMUSCULAR | Status: DC | PRN

## 2022-02-23 MED ORDER — ESCITALOPRAM OXALATE 10 MG PO TABS
20.0000 mg | ORAL_TABLET | Freq: Every day | ORAL | Status: DC
Start: 2022-02-24 — End: 2022-02-27
  Administered 2022-02-24 – 2022-02-27 (×4): 20 mg via ORAL
  Filled 2022-02-23 (×4): qty 2

## 2022-02-23 MED ORDER — CALCIUM CARBONATE 1250 (500 CA) MG PO TABS
1250.0000 mg | ORAL_TABLET | Freq: Two times a day (BID) | ORAL | Status: DC
  Administered 2022-02-24 – 2022-02-27 (×6): 1250 mg via ORAL
  Filled 2022-02-23 (×8): qty 1

## 2022-02-23 NOTE — H&P (Signed)
History and Physical    Patient: Anne Oneill:[id number] DOB: Nov 26, 1926 DOA: 02/23/2022 DOS: the patient was seen and examined on 02/23/2022 PCP: Adin Hector, MD  Patient coming from: Home  Chief Complaint:  Chief Complaint  Patient presents with   Fall    HPI: Anne Oneill is a 88 y.o. Oneill with medical history significant for Frequent falls, ambulant with rolling walker at baseline and lives with her daughter who is a nurse  history of right hip fracture complicated by right lower extremity DVT for which she remains on long-term Eliquis, Alzheimer's dementia, meningioma, history of breast cancer who was brought to the ED by EMS following a fall onto her back and right hip.  The fall was captured on video and she was observed to hit her head but did not lose consciousness.  History is limited due to dementia.  Daughter is at bedside. ED course and data review: BP 195/80 with otherwise normal vitals.  Labs significant for WBC of 12,000 and glucose of 169.  EKG, with NSR at 67 and no acute ST-T wave changes.  Trauma imaging significant for multiple pelvic ring fractures as follows: IMPRESSION: Acute displaced fractures of the right puboacetabular junction and of the inferior pubic ramus extending through the ischial tuberosity.   Acute displaced fracture of the right sacral ala in zones 1 and 2 extending through the right S1 and S2 neural foramen.   Acute, mildly displaced fracture of the left inferior pubic ramus and nondisplaced fracture involving the left anterior acetabulum extending into the puboacetabular junction.   Prior right proximal femur fixation without evidence of acute hardware complication.   Indeterminate irregular mixed sclerotic lesions involving the central superior and inferior pubic rami. This is favored to be benign, however would recommend correlation with any prior history of malignancy and any prior imaging, if available.  The ED  provider spoke with orthopedist, Dr. Sabra Heck who recommends pain control and rehab.  Hospitalist consulted for admission.     Past Medical History:  Diagnosis Date   Arthritis    Cancer ([place])    Dementia ([place])    mild   DVT (deep venous thrombosis) ([facility])    High cholesterol    Hyperlipemia    Osteoporosis    Squamous cell carcinoma of skin 02/19/2007   R cheek   Past Surgical History:  Procedure Laterality Date   ABDOMINAL HYSTERECTOMY     EYE SURGERY     FRACTURE SURGERY     MASTECTOMY Right    SKIN BIOPSY     Social History:  reports that she has never smoked. She has never used smokeless tobacco. She reports that she does not currently use alcohol. She reports that she does not currently use drugs.  Allergies  Allergen Reactions   Codeine Nausea Only   Ditropan [Oxybutynin]    Hydrocodone Nausea Only   Morphine Nausea Only   Tramadol Nausea Only   Latex Rash    No family history on file.  Prior to Admission medications   Medication Sig Start Date End Date Taking? Authorizing Provider  donepezil (ARICEPT) 10 MG tablet Take 10 mg by mouth daily. 10/26/19  Yes [provider]  ELIQUIS 2.5 MG TABS tablet Take 2.5 mg by mouth 2 (two) times daily. 11/04/19  Yes [provider]  escitalopram (LEXAPRO) 20 MG tablet Take 20 mg by mouth daily. 11/30/21  Yes [provider]  memantine (NAMENDA) 10 MG tablet Take 10 mg by  mouth 2 (two) times daily. 11/30/21  Yes [provider]  mirabegron ER (MYRBETRIQ) 50 MG TB24 tablet Take 1 tablet (50 mg total) by mouth daily. 01/04/20  Yes MacDiarmid, Nicki Reaper, MD  simvastatin (ZOCOR) 20 MG tablet Take 20 mg by mouth at bedtime. 09/20/19  Yes [provider]    Physical Exam: Vitals:   02/23/22 1642 02/23/22 1700 02/23/22 1800 02/23/22 1954  BP: (!) 190/72 (!) 193/77 (!) 191/82 (!) 187/79  Pulse: 82 83 78 82  Resp: 20 (!) 24 (!) 25 (!) 22  Temp:    (!) 97 F (36.1 C)  TempSrc:    Oral  SpO2:  99% 99% 96% 99%  Weight:      Height:       Physical Exam Vitals and nursing note reviewed.  Constitutional:      General: She is not in acute distress. HENT:     Head: Normocephalic and atraumatic.  Cardiovascular:     Rate and Rhythm: Normal rate and regular rhythm.     Heart sounds: Normal heart sounds.  Pulmonary:     Effort: Pulmonary effort is normal.     Breath sounds: Normal breath sounds.  Abdominal:     Palpations: Abdomen is soft.     Tenderness: There is no abdominal tenderness.  Neurological:     Mental Status: Mental status is at baseline.     Labs on Admission: I have personally reviewed following labs and imaging studies  CBC: Recent Labs  Lab 02/23/22 1741  WBC 12.2*  NEUTROABS 11.1*  HGB 13.9  HCT 43.0  MCV 92.1  PLT 093   Basic Metabolic Panel: Recent Labs  Lab 02/23/22 1741  NA 139  K 3.8  CL 105  CO2 29  GLUCOSE 169*  BUN 16  CREATININE 0.85  CALCIUM 9.1   GFR: Estimated Creatinine Clearance: 28.1 mL/min (by C-G formula based on SCr of 0.85 mg/dL). Liver Function Tests: No results for input(s): "AST", "ALT", "ALKPHOS", "BILITOT", "PROT", "ALBUMIN" in the last 168 hours. No results for input(s): "LIPASE", "AMYLASE" in the last 168 hours. No results for input(s): "AMMONIA" in the last 168 hours. Coagulation Profile: No results for input(s): "INR", "PROTIME" in the last 168 hours. Cardiac Enzymes: No results for input(s): "CKTOTAL", "CKMB", "CKMBINDEX", "TROPONINI" in the last 168 hours. BNP (last 3 results) No results for input(s): "PROBNP" in the last 8760 hours. HbA1C: No results for input(s): "HGBA1C" in the last 72 hours. CBG: No results for input(s): "GLUCAP" in the last 168 hours. Lipid Profile: No results for input(s): "CHOL", "HDL", "LDLCALC", "TRIG", "CHOLHDL", "LDLDIRECT" in the last 72 hours. Thyroid Function Tests: No results for input(s): "TSH", "T4TOTAL", "FREET4", "T3FREE", "THYROIDAB" in the last 72 hours. Anemia  Panel: No results for input(s): "VITAMINB12", "FOLATE", "FERRITIN", "TIBC", "IRON", "RETICCTPCT" in the last 72 hours. Urine analysis:    Component Value Date/Time   COLORURINE YELLOW (A) 11/17/2019 1126   APPEARANCEUR CLOUDY (A) 11/17/2019 1126   LABSPEC 1.009 11/17/2019 1126   PHURINE 9.0 (H) 11/17/2019 1126   GLUCOSEU NEGATIVE 11/17/2019 1126   HGBUR NEGATIVE 11/17/2019 1126   BILIRUBINUR NEGATIVE 11/17/2019 1126   KETONESUR 5 (A) 11/17/2019 1126   PROTEINUR NEGATIVE 11/17/2019 1126   NITRITE NEGATIVE 11/17/2019 1126   LEUKOCYTESUR NEGATIVE 11/17/2019 1126    Radiological Exams on Admission: CT Lumbar Spine Wo Contrast  Result Date: 02/23/2022 CLINICAL DATA:  Lower back pain EXAM: CT LUMBAR SPINE WITHOUT CONTRAST TECHNIQUE: Multidetector CT imaging of the lumbar spine  was performed without intravenous contrast administration. Multiplanar CT image reconstructions were also generated. RADIATION DOSE REDUCTION: This exam was performed according to the departmental dose-optimization program which includes automated exposure control, adjustment of the mA and/or kV according to patient size and/or use of iterative reconstruction technique. COMPARISON:  None Available. FINDINGS: Segmentation: 5 lumbar type vertebrae. Alignment: Degenerative grade 1 anterolisthesis at L4-L5 and L5-S1. Vertebrae: There is a nondisplaced fracture of the right L5 transverse process. There is an acute displaced fracture of the right sacral ala, as described on recent pelvis CT, in zones 1 and 2 extending into the right S1 and S2 neural foramen. There is a vertebral body hemangioma of T12. Paraspinal and other soft tissues: Aortoiliac atherosclerosis. Large left renal cyst which requires no follow-up imaging. Left adrenal thickening without discrete nodule. No follow-up recommended. Paraspinal muscle atrophy. Disc levels: There is multilevel disc bulging and facet arthropathy resulting in varying degrees of mild to  moderate spinal canal and neural foraminal stenoses, worst at L4-L5 and L5-S1 with severe bilateral facet arthropathy and grade 1 anterolisthesis at this levels. Moderate neural foraminal stenosis and on the right at L4-L5 and on the left at L5-S1. IMPRESSION: Acute right sacral ala fracture in zones 1 and 2 extending into the S1 and S2 neural foramen. Nondisplaced fracture of the right L5 transverse process. Multilevel disc bulging and facet arthropathy with varying degrees of mild to moderate spinal canal neural foraminal stenosis, worst at L4-L5 and L5-S1, with degenerative grade 1 anterolisthesis at these levels. Electronically Signed   By: Maurine Simmering M.D.   On: 02/23/2022 17:00   CT Cervical Spine Wo Contrast  Result Date: 02/23/2022 CLINICAL DATA:  Fall onto back and right hip.  Pain. EXAM: CT CERVICAL SPINE WITHOUT CONTRAST TECHNIQUE: Multidetector CT imaging of the cervical spine was performed without intravenous contrast. Multiplanar CT image reconstructions were also generated. RADIATION DOSE REDUCTION: This exam was performed according to the departmental dose-optimization program which includes automated exposure control, adjustment of the mA and/or kV according to patient size and/or use of iterative reconstruction technique. COMPARISON:  None Available. FINDINGS: Alignment: 3 mm grade 1 anterolisthesis of C3 on C4, 3 mm grade 1 anterolisthesis of C4 on C5, 1 mm grade 1 anterolisthesis of C5 on C6, and 2 mm retrolisthesis of C6 on C7. The facet joints are appropriately aligned. Mild dextrocurvature centered at C4-5. Skull base and vertebrae: The atlantodens interval is intact. Moderate degenerative changes at the atlantodens interval. Vertebral body heights are maintained. Minimal posterior C3-4 and C4-5, severe diffuse C5-6 and C6-7, and mild right C7-T1 disc space narrowing. No acute fracture is seen. Soft tissues and spinal canal: No prevertebral fluid or swelling. No visible canal hematoma.  Disc levels: Multilevel degenerative disc and joint changes including disc space narrowing, uncovertebral hypertrophy, and facet joint hypertrophy contribute to mild left C3-4, mild left C4-5, and minimal bilateral C5-6 neuroforaminal narrowing. No significant central canal stenosis. Upper chest: There is mild-to-moderate bilateral apical scarring. Other: Mildly heterogeneous thyroid gland consistent with right-greater-than-left goiter. No follow-up imaging necessary recommended. No cervical chain lymphadenopathy. IMPRESSION: 1. Grade 1 anterolisthesis of C3 on C4, C4 on C5, and C5 on C6, and mild retrolisthesis of C6 on C7. 2. Multilevel degenerative disc and joint changes as above. 3. No acute fracture or static spondylolisthesis. Electronically Signed   By: Yvonne Kendall M.D.   On: 02/23/2022 16:55   CT Hip Right Wo Contrast  Result Date: 02/23/2022 CLINICAL DATA:  Fall, pain EXAM:  CT OF THE RIGHT HIP WITHOUT CONTRAST TECHNIQUE: Multidetector CT imaging of the right hip was performed according to the standard protocol. Multiplanar CT image reconstructions were also generated. RADIATION DOSE REDUCTION: This exam was performed according to the departmental dose-optimization program which includes automated exposure control, adjustment of the mA and/or kV according to patient size and/or use of iterative reconstruction technique. COMPARISON:  Same day radiograph. FINDINGS: Bones/Joint/Cartilage Acute displaced fracture involving the junction of the anterior acetabulum and superior pubic ramus. There is an acute displaced fracture of the right inferior pubic ramus extending through the ischial tuberosity. There is an acute fracture of the right sacral ala and zones 1 and 2 with involvement of the S1 and S2 foramen. There is a mildly displaced left inferior pubic ramus fracture and a nondisplaced fracture involving the left anterior acetabulum extending into the puboacetabular junction. Prior right proximal femur  fixation without evidence of hardware complication. There is irregular sclerosis of the central superior and inferior pubic rami bilaterally. Old healed fracture of S3. Ligaments Suboptimally assessed by CT. Muscles and Tendons No acute myotendinous abnormality by CT. No intramuscular collection. Soft tissues No focal fluid collection. There is an mild soft tissue swelling adjacent to the fractures. Prior hysterectomy. Sigmoid diverticulosis. Vascular calcifications. IMPRESSION: Acute displaced fractures of the right puboacetabular junction and of the inferior pubic ramus extending through the ischial tuberosity. Acute displaced fracture of the right sacral ala in zones 1 and 2 extending through the right S1 and S2 neural foramen. Acute, mildly displaced fracture of the left inferior pubic ramus and nondisplaced fracture involving the left anterior acetabulum extending into the puboacetabular junction. Prior right proximal femur fixation without evidence of acute hardware complication. Indeterminate irregular mixed sclerotic lesions involving the central superior and inferior pubic rami. This is favored to be benign, however would recommend correlation with any prior history of malignancy and any prior imaging, if available. Electronically Signed   By: Maurine Simmering M.D.   On: 02/23/2022 16:50   CT Head Wo Contrast  Result Date: 02/23/2022 CLINICAL DATA:  Mechanical fall onto back and right hip. Patient denies head injury. EXAM: CT HEAD WITHOUT CONTRAST TECHNIQUE: Contiguous axial images were obtained from the base of the skull through the vertex without intravenous contrast. RADIATION DOSE REDUCTION: This exam was performed according to the departmental dose-optimization program which includes automated exposure control, adjustment of the mA and/or kV according to patient size and/or use of iterative reconstruction technique. COMPARISON:  CT brain 02/24/2020 FINDINGS: Brain: There is moderate cortical atrophy,  unchanged from prior and within normal limits for patient age. The ventricles are normal in configuration. The basilar cisterns are patent. There is again a left parafalcine calcification compatible with an unchanged meningioma measuring up to 3 x 9 x 11 mm (transverse by AP by craniocaudal). Left posterior cerebellar hemisphere calcification is also again unchanged and chronic. No acute intracranial hemorrhage is seen. No abnormal extra-axial fluid collection. Moderate periventricular and subcortical white matter patchy hypodensities are not simply changed, likely chronic ischemic white matter changes. Preservation of the normal cortical gray-white interface without CT evidence of an acute major vascular territorial cortical based infarction. Vascular: No hyperdense vessel or unexpected calcification. There are skull base intracranial atherosclerotic calcifications. Skull: Unchanged small anterior left frontal osteoma. Normal. Negative for fracture or focal lesion. Sinuses/Orbits: Status post bilateral ocular lens replacements. The visualized paranasal sinuses and mastoid air cells are clear. Other: None. IMPRESSION: 1. No significant change from prior.  No acute intracranial  process. 2. Moderate cerebral atrophy and chronic microvascular ischemic changes. 3. Unchanged left parafalcine meningioma. Electronically Signed   By: Yvonne Kendall M.D.   On: 02/23/2022 16:44   DG Hip Unilat W or Wo Pelvis 2-3 Views Right  Result Date: 02/23/2022 CLINICAL DATA:  fall, pain EXAM: DG HIP (WITH OR WITHOUT PELVIS) 2-3V RIGHT COMPARISON:  None Available. FINDINGS: Osteopenia. There is a comminuted fracture of the RIGHT superior and inferior pubic ramus. There is mild inferior displacement of the medial fragments. Status post intramedullary rod fixation of the RIGHT femur. IMPRESSION: Comminuted and displaced fracture of the superior and inferior RIGHT pubic ramus. CT may be of assistance to assess for any intra acetabular  extension. Electronically Signed   By: Valentino Saxon M.D.   On: 02/23/2022 15:46     Data Reviewed: Relevant notes from primary care and specialist visits, past discharge summaries as available in EHR, including Care Everywhere. Prior diagnostic testing as pertinent to current admission diagnoses Updated medications and problem lists for reconciliation ED course, including vitals, labs, imaging, treatment and response to treatment Triage notes, nursing and pharmacy notes and ED provider's notes Notable results as noted in HPI   Assessment and Plan: * Pelvic ring fracture, closed, initial encounter ([place]) Accidental fall Osteoporosis CT showing acute displaced fractures of the right puboacetabular junction,inferior pubic ramus extending through the ischial Tuberosity, right sacral ala,eft inferior pubic ramus and nondisplaced fracture involving the left anterior acetabulum extending into the puboacetabular junction. Pain control.  Daughter requests Dilaudid or fentanyl as patient has had intense vomiting with several other oral opiates including tramadol Orthopedic surgeon, Dr. Sabra Heck was consulted from the ED and recommended pain control and rehab [facility] consult     Hypertensive urgency In part related to pain Not currently on antihypertensives Pain control Hydralazine as needed to keep systolic under 638.  Mixed Alzheimer's and vascular dementia ([place]) Continue Namenda, Aricept, Lexapro Delirium precautions --Get up during the day --Keep blinds open and lights on during daylight hours --Minimize the use of opioids/benzodiazepines --Reorient the patient frequently, provide easily visible clock and calendar --Provide sensory aids like glasses, hearing aids --Encourage ambulation, regular activities and visitors to maintain cognitive stimulation  --Patient would benefit from having family members at bedside to reinforce his orientation.   History of TIA (transient ischemic  attack) Continue simvastatin  History of DVT (deep vein thrombosis) Continue Eliquis  History of breast cancer No acute issues        DVT prophylaxis: Eliquis  Consults: none  Advance Care Planning: DNR  Family Communication: Daughters [name] and Barbee Cough  Disposition Plan: Back to previous home environment  Severity of Illness: The appropriate patient status for this patient is INPATIENT. Inpatient status is judged to be reasonable and necessary in order to provide the required intensity of service to ensure the patient's safety. The patient's presenting symptoms, physical exam findings, and initial radiographic and laboratory data in the context of their chronic comorbidities is felt to place them at high risk for further clinical deterioration. Furthermore, it is not anticipated that the patient will be medically stable for discharge from the hospital within 2 midnights of admission.   * I certify that at the point of admission it is my clinical judgment that the patient will require inpatient hospital care spanning beyond 2 midnights from the point of admission due to high intensity of service, high risk for further deterioration and high frequency of surveillance required.*  Author: Athena Masse, MD  02/23/2022 8:27 PM  For on call review [email or web address].

## 2022-02-23 NOTE — Assessment & Plan Note (Addendum)
Continue Namenda, Aricept, Lexapro Delirium precautions --Get up during the day --Keep blinds open and lights on during daylight hours --Minimize the use of opioids/benzodiazepines --Reorient the patient frequently, provide easily visible clock and calendar --Provide sensory aids like glasses, hearing aids --Encourage ambulation, regular activities and visitors to maintain cognitive stimulation  --Patient would benefit from having family members at bedside to reinforce his orientation.

## 2022-02-23 NOTE — Assessment & Plan Note (Signed)
No acute issues.

## 2022-02-23 NOTE — Assessment & Plan Note (Signed)
Continue simvastatin. 

## 2022-02-23 NOTE — Assessment & Plan Note (Signed)
Secondary to pain.  Continue to monitor.

## 2022-02-23 NOTE — ED Provider Notes (Signed)
[facility] Provider Note    Event Date/Time   First MD Initiated Contact with Patient 02/23/22 1516     (approximate)   History   Fall   HPI  Anne Oneill is a 88 y.o. female   who presents to the emergency department today because of concerns for right hip pain after a fall.  The patient was in her room when it appeared that she tripped.  She does have some dementia so cannot give any significant history.  Daughter however has a video of the fall.  The patient fell over and hit her head on the wall.  She is complaining primarily of right hip pain.  Does have small bruise to her right elbow.      Physical Exam   Triage Vital Signs: ED Triage Vitals  Enc Vitals Group     BP 02/23/22 1502 (!) 195/80     Pulse Rate 02/23/22 1502 71     Resp 02/23/22 1502 16     Temp 02/23/22 1502 98.1 F (36.7 C)     Temp Source 02/23/22 1502 Oral     SpO2 02/23/22 1502 96 %     Weight 02/23/22 1505 94 lb 12.8 oz (43 kg)     Height 02/23/22 1505 5' (1.524 m)     Head Circumference --      Peak Flow --      Pain Score --      Pain Loc --      Pain Edu? --      Excl. in [place]? --     Most recent vital signs: Vitals:   02/23/22 1502  BP: (!) 195/80  Pulse: 71  Resp: 16  Temp: 98.1 F (36.7 C)  SpO2: 96%    General: Awake, alert. CV:  Good peripheral perfusion. Regular rate and rhythm. Resp:  Normal effort. Lungs clear. Abd:  No distention.  Other:  Bruising to right elbow but full and painless ROM. Tenderness to manipulation of the right hip.    ED Results / Procedures / Treatments   Labs (all labs ordered are listed, but only abnormal results are displayed) Labs Reviewed  CBC WITH DIFFERENTIAL/PLATELET - Abnormal; Notable for the following components:      Result Value   WBC 12.2 (*)    Neutro Abs 11.1 (*)    Lymphs Abs 0.6 (*)    All other components within normal limits  BASIC METABOLIC PANEL - Abnormal; Notable for the following  components:   Glucose, Bld 169 (*)    All other components within normal limits     EKG  None   RADIOLOGY I independently interpreted and visualized the right hip x-ray. My interpretation: pubic rami fracture Radiology interpretation:  IMPRESSION:  Comminuted and displaced fracture of the superior and inferior RIGHT  pubic ramus. CT may be of assistance to assess for any intra  acetabular extension.    CT head Radiology interpretation:  IMPRESSION:  1. No significant change from prior.  No acute intracranial process.  2. Moderate cerebral atrophy and chronic microvascular ischemic  changes.  3. Unchanged left parafalcine meningioma.   CT cervical spine Radiology interpretation:  IMPRESSION:  1. Grade 1 anterolisthesis of C3 on C4, C4 on C5, and C5 on C6, and  mild retrolisthesis of C6 on C7.  2. Multilevel degenerative disc and joint changes as above.  3. No acute fracture or static spondylolisthesis.    CT lumbar spine Radiology interpretation:  IMPRESSION:  Acute right sacral ala fracture in zones 1 and 2 extending into the  S1 and S2 neural foramen.    Nondisplaced fracture of the right L5 transverse process.    Multilevel disc bulging and facet arthropathy with varying degrees  of mild to moderate spinal canal neural foraminal stenosis, worst at  L4-L5 and L5-S1, with degenerative grade 1 anterolisthesis at these  levels.   CT hip Radiology interpretation:  IMPRESSION:  Acute displaced fractures of the right puboacetabular junction and  of the inferior pubic ramus extending through the ischial  tuberosity.    Acute displaced fracture of the right sacral ala in zones 1 and 2  extending through the right S1 and S2 neural foramen.    Acute, mildly displaced fracture of the left inferior pubic ramus  and nondisplaced fracture involving the left anterior acetabulum  extending into the puboacetabular junction.    Prior right proximal femur fixation  without evidence of acute  hardware complication.    Indeterminate irregular mixed sclerotic lesions involving the  central superior and inferior pubic rami. This is favored to be  benign, however would recommend correlation with any prior history  of malignancy and any prior imaging, if available.      PROCEDURES:  Critical Care performed: No  Procedures   MEDICATIONS ORDERED IN ED: Medications - No data to display   IMPRESSION / MDM / [place] / ED COURSE  I reviewed the triage vital signs and the nursing notes.                              Differential diagnosis includes, but is not limited to, hip fracture, dislocation, pubic rami fracture, intracranial process.   Patient's presentation is most consistent with acute presentation with potential threat to life or bodily function.  Patient presents to the emergency department today because of concern for right hip pain after a fall. The patient's imaging does show multiple pelvic fractures. CT head and cervical spine negative. Discussed with Dr. Sabra Heck with orthopedics who did not feel patient would benefit from surgery. Discussed with Dr. Damita Dunnings with the hospitalist service who will plan on admission.   FINAL CLINICAL IMPRESSION(S) / ED DIAGNOSES   Final diagnoses:  Closed displaced fracture of pelvis, unspecified part of pelvis, initial encounter [facility])    Note:  This document was prepared using Dragon voice recognition software and may include unintentional dictation errors.    Nance Pear, MD 02/23/22 2009

## 2022-02-23 NOTE — ED Notes (Signed)
Spoke via secure text with RN assuming care of pt.  Pt to be transferred via stretcher by transporter to assigned room.

## 2022-02-23 NOTE — IPAL (Signed)
  Interdisciplinary Goals of Care Family Meeting   Date carried out: 02/23/2022  Location of the meeting: Phone conference  Member's involved: Physician and Family Member or next of kin  Durable Power of Attorney or acting medical decision maker: Barbee Cough, [place] and daughter [name]  Discussion: We discussed goals of care for [employer] .  Patient has a living well.  They state that their mother would never want to be resuscitated as she has seen other family members that when the time comes she would just want to go in peace.  Code status: Full DNR  Disposition: Continue current acute care  Time spent for the meeting: [place], MD  02/23/2022, 8:37 PM

## 2022-02-23 NOTE — Assessment & Plan Note (Deleted)
Accidental fall Osteoporosis CT showing acute displaced fractures of the right puboacetabular junction,inferior pubic ramus extending through the ischial Tuberosity, right sacral ala,eft inferior pubic ramus and nondisplaced fracture involving the left anterior acetabulum extending into the puboacetabular junction. Pain control.  Daughter requests Dilaudid or fentanyl as patient has had intense vomiting with several other oral opiates including tramadol Orthopedic surgeon, Dr. Sabra Heck was consulted from the ED and recommended pain control and rehab [facility] consult

## 2022-02-23 NOTE — ED Triage Notes (Signed)
Pt from home via ems with reports that pt had a mechanical fall onto her back and rt hip with pain in lower back and rt hip. Pt denies head injury. Was given 53mg of fentanyl PTA.

## 2022-02-23 NOTE — Assessment & Plan Note (Addendum)
-   Continue Eliquis 

## 2022-02-24 DIAGNOSIS — F0280 Dementia in other diseases classified elsewhere without behavioral disturbance: Secondary | ICD-10-CM

## 2022-02-24 DIAGNOSIS — S32810S Multiple fractures of pelvis with stable disruption of pelvic ring, sequela: Secondary | ICD-10-CM | POA: Diagnosis not present

## 2022-02-24 DIAGNOSIS — G309 Alzheimer's disease, unspecified: Secondary | ICD-10-CM

## 2022-02-24 DIAGNOSIS — M8000XS Age-related osteoporosis with current pathological fracture, unspecified site, sequela: Secondary | ICD-10-CM

## 2022-02-24 DIAGNOSIS — S3210XS Unspecified fracture of sacrum, sequela: Secondary | ICD-10-CM | POA: Diagnosis not present

## 2022-02-24 DIAGNOSIS — I160 Hypertensive urgency: Secondary | ICD-10-CM | POA: Diagnosis not present

## 2022-02-24 DIAGNOSIS — S3210XA Unspecified fracture of sacrum, initial encounter for closed fracture: Secondary | ICD-10-CM

## 2022-02-24 DIAGNOSIS — F0150 Vascular dementia without behavioral disturbance: Secondary | ICD-10-CM

## 2022-02-24 DIAGNOSIS — Z853 Personal history of malignant neoplasm of breast: Secondary | ICD-10-CM

## 2022-02-24 NOTE — Plan of Care (Signed)
  Problem: Nutrition: Goal: Adequate nutrition will be maintained Outcome: Progressing   Problem: Pain Managment: Goal: General experience of comfort will improve Outcome: Progressing   Problem: Clinical Measurements: Goal: Cardiovascular complication will be avoided Outcome: Progressing   Problem: Elimination: Goal: Will not experience complications related to bowel motility Outcome: Progressing Goal: Will not experience complications related to urinary retention Outcome: Progressing

## 2022-02-24 NOTE — Plan of Care (Signed)

## 2022-02-24 NOTE — Assessment & Plan Note (Signed)
Pain control.  Patient with numerous allergies to pain medications.  Try Tylenol first for pain.  We will give a few pills of oral Dilaudid upon going out to rehab.  PT and OT consultations recommending rehab.

## 2022-02-24 NOTE — Assessment & Plan Note (Signed)
Continue calcium and vitamin D 

## 2022-02-24 NOTE — Progress Notes (Signed)
  Progress Note   Patient: Anne Oneill MWU:[id number] DOB: 09-11-1926 DOA: 02/23/2022     1 DOS: the patient was seen and examined on 02/24/2022     Assessment and Plan: * Pelvic ring fracture, sequela Pain control.  Patient with numerous allergies to pain medications.  On IV Dilaudid currently.  PT and OT consultations.  Sacral fracture, closed (HCC) Pain control.  PT and OT consultations.  Hypertensive urgency Secondary to pain.  Blood pressure better controlled today.  Osteoporosis Continue calcium and vitamin D  Mixed Alzheimer's and vascular dementia ([place]) Continue Namenda, Aricept, Lexapro   History of TIA (transient ischemic attack) Continue simvastatin  History of DVT (deep vein thrombosis) Continue Eliquis  History of breast cancer No acute issues        Subjective: Patient had a fall at home.  She does not remember how she fell.  Patient's daughter said she recorded on the ring camera where she was doing something and turned around and then fell.  Found to have a right sacral fracture, left inferior pubic rami fracture and left anterior acetabular fracture.  Physical Exam: Vitals:   02/24/22 0354 02/24/22 0354 02/24/22 0800 02/24/22 1143  BP: (!) 160/85  130/68 130/66  Pulse:  72 74 80  Resp:  '17 20 16  '$ Temp:  98.9 F (37.2 C) 98.3 F (36.8 C) 98.4 F (36.9 C)  TempSrc:   Oral   SpO2:  96% 94% 96%  Weight:      Height:       Physical Exam HENT:     Head: Normocephalic.     Mouth/Throat:     Pharynx: No oropharyngeal exudate.  Eyes:     General: Lids are normal.     Conjunctiva/sclera: Conjunctivae normal.  Cardiovascular:     Rate and Rhythm: Normal rate and regular rhythm.     Heart sounds: Normal heart sounds, S1 normal and S2 normal.  Pulmonary:     Breath sounds: No decreased breath sounds, wheezing, rhonchi or rales.  Abdominal:     Palpations: Abdomen is soft.     Tenderness: There is no abdominal tenderness.  Musculoskeletal:      Right lower leg: No swelling.     Left lower leg: No swelling.  Skin:    General: Skin is warm.     Findings: No rash.  Neurological:     Mental Status: She is alert.     Comments: Unable to straight leg raise with either leg.  Unable to hold up either leg for very long when I held up her leg.     Data Reviewed: White blood cell count 12.2, glucose 169 CT scan of the head negative CT scan of the lumbar spine shows acute right sacral ala fracture in zones 1 to extending into S1 and S2 neural foramen CT scan of the hip shows acute mildly displaced fracture of left inferior pubic ramus and nondisplaced fracture of the left anterior acetabulum X-ray commenting on superior and inferior right pubic rami fractures Family Communication: Spoke with patient's daughter at the bedside  Disposition: Status is: Inpatient Remains inpatient appropriate because: Numerous fractures seen on imaging.  Likely will need rehab  Planned Discharge Destination: Rehab    Time spent: 28 minutes  Author: Loletha Grayer, MD 02/24/2022 1:33 PM  For on call review [email or web address].

## 2022-02-24 NOTE — Progress Notes (Signed)
Chaplain responded to spiritual consult. Pt. Requested a Priest visit. Chaplain offered compassionate presence and contacted Idelle Crouch to request a visit for PT. Pt was grateful for Chaplains visit. Please contact [place] if requested.

## 2022-02-24 NOTE — Assessment & Plan Note (Addendum)
Pain control.  PT and OT recommending rehab

## 2022-02-24 NOTE — Consult Note (Signed)
ORTHOPAEDIC CONSULTATION  REQUESTING PHYSICIAN: Loletha Grayer, MD  Chief Complaint: Right hip pain right hip pain after a fall yesterday.  HPI: Anne Oneill is a 88 y.o. female who complains of right hip pain after a fall yesterday.  He was brought to the emergency room where exam and x-rays revealed fractures of the superior pubic rami.  The acetabulum was relatively intact.  CT scan also showed some damage to the transverse process on the right and the sacral alae.  She had a previous right hip intertrochanteric fracture with a trochanteric fixation nail.  This was well-healed and there was no damage to the femur.  She been admitted for pain control and placement.  Past Medical History:  Diagnosis Date   Arthritis    Cancer ([place])    Dementia ([place])    mild   DVT (deep venous thrombosis) ([facility])    High cholesterol    Hyperlipemia    Osteoporosis    Squamous cell carcinoma of skin 02/19/2007   R cheek   Past Surgical History:  Procedure Laterality Date   ABDOMINAL HYSTERECTOMY     EYE SURGERY     FRACTURE SURGERY     MASTECTOMY Right    SKIN BIOPSY     Social History   Socioeconomic History   Marital status: Widowed    Spouse name: Not on file   Number of children: Not on file   Years of education: Not on file   Highest education level: Not on file  Occupational History   Not on file  Tobacco Use   Smoking status: Never   Smokeless tobacco: Never  Substance and Sexual Activity   Alcohol use: Not Currently   Drug use: Not Currently   Sexual activity: Not on file  Other Topics Concern   Not on file  Social History Narrative   Not on file   Social Determinants of Health   Financial Resource Strain: Not on file  Food Insecurity: No Food Insecurity (02/23/2022)   Hunger Vital Sign    Worried About Running Out of Food in the Last Year: Never true    Ran Out of Food in the Last Year: Never true  Transportation Needs: No Transportation Needs (02/23/2022)    PRAPARE - Hydrologist (Medical): No    Lack of Transportation (Non-Medical): No  Physical Activity: Not on file  Stress: Not on file  Social Connections: Not on file   No family history on file. Allergies  Allergen Reactions   Codeine Nausea Only   Ditropan [Oxybutynin]    Hydrocodone Nausea Only   Morphine Nausea Only   Tramadol Nausea Only   Latex Rash   Prior to Admission medications   Medication Sig Start Date End Date Taking? Authorizing Provider  calcium carbonate (OSCAL) 1500 (600 Ca) MG TABS tablet Take 600 mg of elemental calcium by mouth 2 (two) times daily with a meal.   Yes [provider]  cyanocobalamin (VITAMIN B12) 1000 MCG tablet Take 1,000 mcg by mouth daily.   Yes [provider]  donepezil (ARICEPT) 10 MG tablet Take 10 mg by mouth daily. 10/26/19  Yes [provider]  ELIQUIS 2.5 MG TABS tablet Take 2.5 mg by mouth 2 (two) times daily. 11/04/19  Yes [provider]  escitalopram (LEXAPRO) 20 MG tablet Take 20 mg by mouth daily. 11/30/21  Yes [provider]  memantine (NAMENDA) 10 MG tablet Take 10 mg by mouth 2 (two) times  daily. 11/30/21  Yes [provider]  mirabegron ER (MYRBETRIQ) 50 MG TB24 tablet Take 1 tablet (50 mg total) by mouth daily. 01/04/20  Yes MacDiarmid, Nicki Reaper, MD  Multiple Vitamins-Minerals (PRESERVISION AREDS 2+MULTI VIT PO) Take 1 Capful by mouth 2 (two) times daily.   Yes [provider]  simvastatin (ZOCOR) 20 MG tablet Take 20 mg by mouth at bedtime. 09/20/19  Yes [provider]   CT Lumbar Spine Wo Contrast  Result Date: 02/23/2022 CLINICAL DATA:  Lower back pain EXAM: CT LUMBAR SPINE WITHOUT CONTRAST TECHNIQUE: Multidetector CT imaging of the lumbar spine was performed without intravenous contrast administration. Multiplanar CT image reconstructions were also generated. RADIATION DOSE REDUCTION: This exam was performed according to the  departmental dose-optimization program which includes automated exposure control, adjustment of the mA and/or kV according to patient size and/or use of iterative reconstruction technique. COMPARISON:  None Available. FINDINGS: Segmentation: 5 lumbar type vertebrae. Alignment: Degenerative grade 1 anterolisthesis at L4-L5 and L5-S1. Vertebrae: There is a nondisplaced fracture of the right L5 transverse process. There is an acute displaced fracture of the right sacral ala, as described on recent pelvis CT, in zones 1 and 2 extending into the right S1 and S2 neural foramen. There is a vertebral body hemangioma of T12. Paraspinal and other soft tissues: Aortoiliac atherosclerosis. Large left renal cyst which requires no follow-up imaging. Left adrenal thickening without discrete nodule. No follow-up recommended. Paraspinal muscle atrophy. Disc levels: There is multilevel disc bulging and facet arthropathy resulting in varying degrees of mild to moderate spinal canal and neural foraminal stenoses, worst at L4-L5 and L5-S1 with severe bilateral facet arthropathy and grade 1 anterolisthesis at this levels. Moderate neural foraminal stenosis and on the right at L4-L5 and on the left at L5-S1. IMPRESSION: Acute right sacral ala fracture in zones 1 and 2 extending into the S1 and S2 neural foramen. Nondisplaced fracture of the right L5 transverse process. Multilevel disc bulging and facet arthropathy with varying degrees of mild to moderate spinal canal neural foraminal stenosis, worst at L4-L5 and L5-S1, with degenerative grade 1 anterolisthesis at these levels. Electronically Signed   By: Maurine Simmering M.D.   On: 02/23/2022 17:00   CT Cervical Spine Wo Contrast  Result Date: 02/23/2022 CLINICAL DATA:  Fall onto back and right hip.  Pain. EXAM: CT CERVICAL SPINE WITHOUT CONTRAST TECHNIQUE: Multidetector CT imaging of the cervical spine was performed without intravenous contrast. Multiplanar CT image reconstructions were  also generated. RADIATION DOSE REDUCTION: This exam was performed according to the departmental dose-optimization program which includes automated exposure control, adjustment of the mA and/or kV according to patient size and/or use of iterative reconstruction technique. COMPARISON:  None Available. FINDINGS: Alignment: 3 mm grade 1 anterolisthesis of C3 on C4, 3 mm grade 1 anterolisthesis of C4 on C5, 1 mm grade 1 anterolisthesis of C5 on C6, and 2 mm retrolisthesis of C6 on C7. The facet joints are appropriately aligned. Mild dextrocurvature centered at C4-5. Skull base and vertebrae: The atlantodens interval is intact. Moderate degenerative changes at the atlantodens interval. Vertebral body heights are maintained. Minimal posterior C3-4 and C4-5, severe diffuse C5-6 and C6-7, and mild right C7-T1 disc space narrowing. No acute fracture is seen. Soft tissues and spinal canal: No prevertebral fluid or swelling. No visible canal hematoma. Disc levels: Multilevel degenerative disc and joint changes including disc space narrowing, uncovertebral hypertrophy, and facet joint hypertrophy contribute to mild left C3-4, mild left C4-5, and minimal bilateral C5-6  neuroforaminal narrowing. No significant central canal stenosis. Upper chest: There is mild-to-moderate bilateral apical scarring. Other: Mildly heterogeneous thyroid gland consistent with right-greater-than-left goiter. No follow-up imaging necessary recommended. No cervical chain lymphadenopathy. IMPRESSION: 1. Grade 1 anterolisthesis of C3 on C4, C4 on C5, and C5 on C6, and mild retrolisthesis of C6 on C7. 2. Multilevel degenerative disc and joint changes as above. 3. No acute fracture or static spondylolisthesis. Electronically Signed   By: Yvonne Kendall M.D.   On: 02/23/2022 16:55   CT Hip Right Wo Contrast  Result Date: 02/23/2022 CLINICAL DATA:  Fall, pain EXAM: CT OF THE RIGHT HIP WITHOUT CONTRAST TECHNIQUE: Multidetector CT imaging of the right hip was  performed according to the standard protocol. Multiplanar CT image reconstructions were also generated. RADIATION DOSE REDUCTION: This exam was performed according to the departmental dose-optimization program which includes automated exposure control, adjustment of the mA and/or kV according to patient size and/or use of iterative reconstruction technique. COMPARISON:  Same day radiograph. FINDINGS: Bones/Joint/Cartilage Acute displaced fracture involving the junction of the anterior acetabulum and superior pubic ramus. There is an acute displaced fracture of the right inferior pubic ramus extending through the ischial tuberosity. There is an acute fracture of the right sacral ala and zones 1 and 2 with involvement of the S1 and S2 foramen. There is a mildly displaced left inferior pubic ramus fracture and a nondisplaced fracture involving the left anterior acetabulum extending into the puboacetabular junction. Prior right proximal femur fixation without evidence of hardware complication. There is irregular sclerosis of the central superior and inferior pubic rami bilaterally. Old healed fracture of S3. Ligaments Suboptimally assessed by CT. Muscles and Tendons No acute myotendinous abnormality by CT. No intramuscular collection. Soft tissues No focal fluid collection. There is an mild soft tissue swelling adjacent to the fractures. Prior hysterectomy. Sigmoid diverticulosis. Vascular calcifications. IMPRESSION: Acute displaced fractures of the right puboacetabular junction and of the inferior pubic ramus extending through the ischial tuberosity. Acute displaced fracture of the right sacral ala in zones 1 and 2 extending through the right S1 and S2 neural foramen. Acute, mildly displaced fracture of the left inferior pubic ramus and nondisplaced fracture involving the left anterior acetabulum extending into the puboacetabular junction. Prior right proximal femur fixation without evidence of acute hardware  complication. Indeterminate irregular mixed sclerotic lesions involving the central superior and inferior pubic rami. This is favored to be benign, however would recommend correlation with any prior history of malignancy and any prior imaging, if available. Electronically Signed   By: Maurine Simmering M.D.   On: 02/23/2022 16:50   CT Head Wo Contrast  Result Date: 02/23/2022 CLINICAL DATA:  Mechanical fall onto back and right hip. Patient denies head injury. EXAM: CT HEAD WITHOUT CONTRAST TECHNIQUE: Contiguous axial images were obtained from the base of the skull through the vertex without intravenous contrast. RADIATION DOSE REDUCTION: This exam was performed according to the departmental dose-optimization program which includes automated exposure control, adjustment of the mA and/or kV according to patient size and/or use of iterative reconstruction technique. COMPARISON:  CT brain 02/24/2020 FINDINGS: Brain: There is moderate cortical atrophy, unchanged from prior and within normal limits for patient age. The ventricles are normal in configuration. The basilar cisterns are patent. There is again a left parafalcine calcification compatible with an unchanged meningioma measuring up to 3 x 9 x 11 mm (transverse by AP by craniocaudal). Left posterior cerebellar hemisphere calcification is also again unchanged and chronic. No acute intracranial  hemorrhage is seen. No abnormal extra-axial fluid collection. Moderate periventricular and subcortical white matter patchy hypodensities are not simply changed, likely chronic ischemic white matter changes. Preservation of the normal cortical gray-white interface without CT evidence of an acute major vascular territorial cortical based infarction. Vascular: No hyperdense vessel or unexpected calcification. There are skull base intracranial atherosclerotic calcifications. Skull: Unchanged small anterior left frontal osteoma. Normal. Negative for fracture or focal lesion.  Sinuses/Orbits: Status post bilateral ocular lens replacements. The visualized paranasal sinuses and mastoid air cells are clear. Other: None. IMPRESSION: 1. No significant change from prior.  No acute intracranial process. 2. Moderate cerebral atrophy and chronic microvascular ischemic changes. 3. Unchanged left parafalcine meningioma. Electronically Signed   By: Yvonne Kendall M.D.   On: 02/23/2022 16:44   DG Hip Unilat W or Wo Pelvis 2-3 Views Right  Result Date: 02/23/2022 CLINICAL DATA:  fall, pain EXAM: DG HIP (WITH OR WITHOUT PELVIS) 2-3V RIGHT COMPARISON:  None Available. FINDINGS: Osteopenia. There is a comminuted fracture of the RIGHT superior and inferior pubic ramus. There is mild inferior displacement of the medial fragments. Status post intramedullary rod fixation of the RIGHT femur. IMPRESSION: Comminuted and displaced fracture of the superior and inferior RIGHT pubic ramus. CT may be of assistance to assess for any intra acetabular extension. Electronically Signed   By: Valentino Saxon M.D.   On: 02/23/2022 15:46    Positive ROS: All other systems have been reviewed and were otherwise negative with the exception of those mentioned in the HPI and as above.  Physical Exam: General: Alert, no acute distress Cardiovascular: No pedal edema Respiratory: No cyanosis, no use of accessory musculature GI: No organomegaly, abdomen is soft and non-tender Skin: No lesions in the area of chief complaint Neurologic: Sensation intact distally Psychiatric: Patient is competent for consent with normal mood and affect Lymphatic: No axillary or cervical lymphadenopathy  MUSCULOSKELETAL: Little pain with range of motion of the hip itself.  She has tenderness over the anterior pubis. Neurovascular status is good distally.  Assessment: Right inferior and superior pubic rami fractures Transverse process and sacral fractures as well.  Plan: Treatment will be conservative and nonoperative. She may  be mobilized gradually. She may participate with PT touch weightbearing on the right with increasing weightbearing as tolerated. Return to my office in 2 weeks for exam and x-rays. 81 mg ASA for DVT prophylaxis if appropriate.    Park Breed, MD [phone]   02/24/2022 2:37 PM

## 2022-02-25 DIAGNOSIS — S3210XS Unspecified fracture of sacrum, sequela: Secondary | ICD-10-CM | POA: Diagnosis not present

## 2022-02-25 DIAGNOSIS — D696 Thrombocytopenia, unspecified: Secondary | ICD-10-CM

## 2022-02-25 DIAGNOSIS — M8000XS Age-related osteoporosis with current pathological fracture, unspecified site, sequela: Secondary | ICD-10-CM | POA: Diagnosis not present

## 2022-02-25 DIAGNOSIS — Z86718 Personal history of other venous thrombosis and embolism: Secondary | ICD-10-CM

## 2022-02-25 DIAGNOSIS — R710 Precipitous drop in hematocrit: Secondary | ICD-10-CM | POA: Diagnosis not present

## 2022-02-25 DIAGNOSIS — S32810S Multiple fractures of pelvis with stable disruption of pelvic ring, sequela: Secondary | ICD-10-CM | POA: Diagnosis not present

## 2022-02-25 DIAGNOSIS — R7301 Impaired fasting glucose: Secondary | ICD-10-CM

## 2022-02-25 LAB — CBC
HCT: 36 % (ref 36.0–46.0)
Hemoglobin: 11.6 g/dL — ABNORMAL LOW (ref 12.0–15.0)
MCH: 30 pg (ref 26.0–34.0)
MCHC: 32.2 g/dL (ref 30.0–36.0)
MCV: 93 fL (ref 80.0–100.0)
Platelets: 130 10*3/uL — ABNORMAL LOW (ref 150–400)
RBC: 3.87 MIL/uL (ref 3.87–5.11)
RDW: 12.8 % (ref 11.5–15.5)
WBC: 9.9 10*3/uL (ref 4.0–10.5)
nRBC: 0 % (ref 0.0–0.2)

## 2022-02-25 LAB — BASIC METABOLIC PANEL
Anion gap: 8 (ref 5–15)
BUN: 28 mg/dL — ABNORMAL HIGH (ref 8–23)
CO2: 27 mmol/L (ref 22–32)
Calcium: 9.5 mg/dL (ref 8.9–10.3)
Chloride: 102 mmol/L (ref 98–111)
Creatinine, Ser: 0.91 mg/dL (ref 0.44–1.00)
GFR, Estimated: 59 mL/min — ABNORMAL LOW (ref >60)
Glucose, Bld: 130 mg/dL — ABNORMAL HIGH (ref 70–99)
Potassium: 4.5 mmol/L (ref 3.5–5.1)
Sodium: 137 mmol/L (ref 135–145)

## 2022-02-25 MED ORDER — ENSURE ENLIVE PO LIQD
237.0000 mL | Freq: Three times a day (TID) | ORAL | Status: DC
  Administered 2022-02-25 – 2022-02-27 (×7): 237 mL via ORAL

## 2022-02-25 NOTE — Evaluation (Signed)
Occupational Therapy Evaluation Patient Details Name: Anne Oneill MRN: [id number] DOB: October 07, 1926 Today's Date: 02/25/2022   History of Present Illness Pt is a 88 y/o female admitted secondary to a fall. X-rays revealed fractures of the superior pubic rami. Ortho was consulted and recommended conservative, non-op treatment with TDWB'ing R LE progressing to WBAT. PMH including but not limited to dementia and cancer.   Clinical Impression   Patient seen for OT evaluation. Patient presenting with RLE pain, decreased cognition, safety awareness, strength, and endurance impacting safety and independence in ADLs. No family/caregivers present during evaluation and patient with dementia at baseline, therefore, unable to provide any reliable information for PLOF. Patient currently functioning at Mod-Max A +2 for functional transfers, Mod A for UB dressing/bathing, Max A for LB dressing/bathing, and Max A for toileting hygiene. Patient's activity tolerance significantly limited by pain. Patient will benefit from acute OT to increase overall independence in the areas of ADLs and functional mobility in order to safely discharge to next venue of care. Upon hospital discharge, recommend STR to maximize pt safety and return to PLOF.        Recommendations for follow up therapy are one component of a multi-disciplinary discharge planning process, led by the attending physician.  Recommendations may be updated based on patient status, additional functional criteria and insurance authorization.   Follow Up Recommendations  Skilled nursing-short term rehab (<3 hours/day)    Assistance Recommended at Discharge Frequent or constant Supervision/Assistance  Patient can return home with the following Two people to help with walking and/or transfers;A lot of help with bathing/dressing/bathroom;Assist for transportation;Help with stairs or ramp for entrance;Direct supervision/assist for medications management;Direct  supervision/assist for financial management    Functional Status Assessment  Patient has had a recent decline in their functional status and demonstrates the ability to make significant improvements in function in a reasonable and predictable amount of time.  Equipment Recommendations  Other (comment) (defer to next venue of care)    Recommendations for Other Services       Precautions / Restrictions Precautions Precautions: Fall Precaution Comments: incontinent, particularly with standing Restrictions Weight Bearing Restrictions: Yes RLE Weight Bearing: Touchdown weight bearing Other Position/Activity Restrictions: TDWB'ing progressing to WBAT per ortho note, no orders      Mobility Bed Mobility               General bed mobility comments: NT, pt received on BSC with NT/RN present and left in recliner    Transfers Overall transfer level: Needs assistance Equipment used: 2 person hand held assist Transfers: Sit to/from Stand, Bed to chair/wheelchair/BSC Sit to Stand: Mod assist, Max assist, +2 physical assistance Stand pivot transfers: Max assist, +2 physical assistance         General transfer comment: verbal cues for safe transfer techniques (hand placement, sequencing), Mod-Max A +2 for STS from [facility], Max A +2 for SPT over to recliner      Balance Overall balance assessment: Needs assistance Sitting-balance support: Feet supported, Bilateral upper extremity supported, Single extremity supported Sitting balance-Leahy Scale: Poor     Standing balance support: Bilateral upper extremity supported Standing balance-Leahy Scale: Poor Standing balance comment: Mod-max A +2                           ADL either performed or assessed with clinical judgement   ADL Overall ADL's : Needs assistance/impaired         Upper Body Bathing: Sitting;Moderate  assistance   Lower Body Bathing: Maximal assistance;Sitting/lateral leans   Upper Body Dressing :  Moderate assistance;Sitting   Lower Body Dressing: Maximal assistance;Sitting/lateral leans   Toilet Transfer: Stand-pivot;+2 for physical assistance;Moderate assistance;Maximal assistance Toilet Transfer Details (indicate cue type and reason): simulated with SPT from BSC > recliner, Mod-Max A +2 via handheld assist Toileting- Clothing Manipulation and Hygiene: Maximal assistance Toileting - Clothing Manipulation Details (indicate cue type and reason): pt with incontinent episode upon standing (urinated all over the floor)             Vision Baseline Vision/History: 1 Wears glasses Patient Visual Report: No change from baseline       Perception     Praxis      Pertinent Vitals/Pain Pain Assessment Pain Assessment: Faces Faces Pain Scale: Hurts even more Pain Location: R LE with movement Pain Descriptors / Indicators: Grimacing, Guarding Pain Intervention(s): Monitored during session, Repositioned     Hand Dominance     Extremity/Trunk Assessment Upper Extremity Assessment Upper Extremity Assessment: Difficult to assess due to impaired cognition   Lower Extremity Assessment Lower Extremity Assessment: Difficult to assess due to impaired cognition;RLE deficits/detail RLE Deficits / Details: pt demonstrated signs of pain (facial grimacing) with movement but able to accept some WB'ing through R LE with transfers RLE: Unable to fully assess due to pain       Communication Communication Communication: No difficulties   Cognition Arousal/Alertness: Awake/alert Behavior During Therapy: WFL for tasks assessed/performed Overall Cognitive Status: History of cognitive impairments - at baseline Area of Impairment: Orientation, Attention, Memory, Following commands, Safety/judgement, Awareness, Problem solving                 Orientation Level: Disoriented to, Situation, Time Current Attention Level: Sustained Memory: Decreased short-term memory, Decreased recall of  precautions Following Commands: Follows one step commands inconsistently, Follows one step commands with increased time Safety/Judgement: Decreased awareness of deficits Awareness: Intellectual Problem Solving: Slow processing, Decreased initiation, Difficulty sequencing, Requires verbal cues       General Comments       Exercises     Shoulder Instructions      Home Living Family/patient expects to be discharged to:: Unsure                                 Additional Comments: per chart review, pt lives with daughter. No family/caregivers present during eval and pt with dementia at baseline and unable to provide any reliable information      Prior Functioning/Environment Prior Level of Function : Patient poor historian/Family not available             ADLs Comments: Per chart review, pt lives with daughter. No family/caregivers present during eval and pt with dementia at baseline and unable to provide any reliable information        OT Problem List: Decreased strength;Decreased knowledge of use of DME or AE;Decreased activity tolerance;Impaired balance (sitting and/or standing);Decreased cognition;Decreased safety awareness;Pain;Decreased knowledge of precautions      OT Treatment/Interventions: Self-care/ADL training;Therapeutic exercise;Patient/family education;Balance training;Energy conservation;Therapeutic activities;DME and/or AE instruction    OT Goals(Current goals can be found in the care plan section) Acute Rehab OT Goals Patient Stated Goal: unable to state OT Goal Formulation: Patient unable to participate in goal setting Time For Goal Achievement: 03/11/22 Potential to Achieve Goals: Fair   OT Frequency: Min 2X/week    Co-evaluation PT/OT/SLP Co-Evaluation/Treatment: Yes Reason for  Co-Treatment: For patient/therapist safety;Necessary to address cognition/behavior during functional activity;To address functional/ADL transfers PT goals  addressed during session: Mobility/safety with mobility;Balance OT goals addressed during session: ADL's and self-care;Proper use of Adaptive equipment and DME      AM-PAC OT "6 Clicks" Daily Activity     Outcome Measure Help from another person eating meals?: None Help from another person taking care of personal grooming?: A Lot Help from another person toileting, which includes using toliet, bedpan, or urinal?: A Lot Help from another person bathing (including washing, rinsing, drying)?: A Lot Help from another person to put on and taking off regular upper body clothing?: A Lot Help from another person to put on and taking off regular lower body clothing?: A Lot 6 Click Score: 14   End of Session Nurse Communication: Mobility status  Activity Tolerance: Patient limited by pain;Patient tolerated treatment well Patient left: in chair;with call bell/phone within reach;with chair alarm set  OT Visit Diagnosis: Pain;Muscle weakness (generalized) (M62.81);Other symptoms and signs involving cognitive function;Unsteadiness on feet (R26.81) Pain - Right/Left: Right Pain - part of body: Hip                Time: 1050-1102 OT Time Calculation (min): 12 min Charges:  OT General Charges $OT Visit: 1 Visit OT Evaluation $OT Eval Low Complexity: 1 Low  [facility] MS, OTR/L ascom [phone]  02/25/22, 12:52 PM

## 2022-02-25 NOTE — Progress Notes (Signed)
Subjective:     Patient is out of bed in the chair this afternoon.  She is very comfortable.  She is alert and cooperative.  Hemoglobin dropped slightly to 11.9.  Neurovascular status is intact.    Patient reports pain as mild.  Objective:   VITALS:   Vitals:   02/25/22 0600 02/25/22 0746  BP: (!) 150/70 (!) 152/63  Pulse:  82  Resp:  18  Temp:  98.7 F (37.1 C)  SpO2:  93%    Neurologically intact  LABS Recent Labs    02/23/22 1741 02/25/22 0500  HGB 13.9 11.6*  HCT 43.0 36.0  WBC 12.2* 9.9  PLT 155 130*    Recent Labs    02/23/22 1741 02/25/22 0500  NA 139 137  K 3.8 4.5  BUN 16 28*  CREATININE 0.85 0.91  GLUCOSE 169* 130*    No results for input(s): "LABPT", "INR" in the last 72 hours.   Assessment/Plan:      Advance diet Up with therapy Discharge to SNF when medically stable and bed available  Follow-up in my office in 2 weeks for exam and x-rays  81 mg ASA twice daily if tolerated.

## 2022-02-25 NOTE — Assessment & Plan Note (Signed)
Hemoglobin dipped from 13.9-11.6.  With being on Eliquis and having a trauma need to watch blood counts closely.  Checking hemoglobin again tomorrow

## 2022-02-25 NOTE — Plan of Care (Signed)

## 2022-02-25 NOTE — NC FL2 (Signed)
[place] LEVEL OF CARE SCREENING TOOL     IDENTIFICATION  Patient Name: Anne Oneill Birthdate: 10-26-1926 Sex: female Admission Date (Current Location): 02/23/2022  [facility] and [place] Number:  Engineering geologist and Address:  [facility], [address] [place], [place] [address]      Provider Number: [id number]  Attending Physician Name and Address:  Loletha Grayer, MD  Relative Name and Phone Number:       Current Level of Care: Hospital Recommended Level of Care: [place] Prior Approval Number:    Date Approved/Denied: 02/25/22 PASRR Number: [id number]  Discharge Plan: SNF    Current Diagnoses: Patient Active Problem List   Diagnosis Date Noted   Drop in hemoglobin 02/25/2022   Thrombocytopenia ([place]) 02/25/2022   Impaired fasting glucose 02/25/2022   Pelvic ring fracture, sequela 02/24/2022   Sacral fracture, closed ([place]) 02/24/2022   Hypertensive urgency 02/23/2022   Dyspepsia 01/04/2020   Hiatal hernia 01/04/2020   History of breast cancer 01/04/2020   History of DVT (deep vein thrombosis) 01/04/2020   History of TIA (transient ischemic attack) 01/04/2020   Hyperlipidemia 01/04/2020   Mitral valve prolapse 01/04/2020   Osteoporosis 01/04/2020   Mixed Alzheimer's and vascular dementia ([place]) 09/06/2019   Meningioma ([place]) 02/11/2018   Mild cognitive impairment 12/11/2017    Orientation RESPIRATION BLADDER Height & Weight     Self, Place  Normal Incontinent, External catheter Weight: 43 kg Height:  5' (152.4 cm)  BEHAVIORAL SYMPTOMS/MOOD NEUROLOGICAL BOWEL NUTRITION STATUS      Continent Diet  AMBULATORY STATUS COMMUNICATION OF NEEDS Skin   Extensive Assist Verbally Bruising (Right Arm)                       Personal Care Assistance Level of Assistance  Bathing, Feeding, Dressing Bathing Assistance: Maximum assistance Feeding assistance: Maximum assistance Dressing Assistance:  Maximum assistance     Functional Limitations Info  Sight Sight Info: Impaired (Corrective glasses)        SPECIAL CARE FACTORS FREQUENCY  PT (By licensed PT), OT (By licensed OT)     PT Frequency: 5x/week OT Frequency: 5x/week            Contractures Contractures Info: Not present    Additional Factors Info  Code Status, Allergies Code Status Info: DNR Allergies Info: Codeine, Ditropan (Oxybutynin), Hydrocodone, Morphine, Tramadol, Latex           Current Medications (02/25/2022):  This is the current hospital active medication list Current Facility-Administered Medications  Medication Dose Route Frequency Provider Last Rate Last Admin   acetaminophen (TYLENOL) tablet 650 mg  650 mg Oral Q6H PRN Athena Masse, MD   650 mg at 02/25/22 1418   Or   acetaminophen (TYLENOL) suppository 650 mg  650 mg Rectal Q6H PRN Athena Masse, MD       apixaban Arne [place]) tablet 2.5 mg  2.5 mg Oral BID Judd Gaudier V, MD   2.5 mg at 02/25/22 0939   calcium carbonate (OS-CAL - dosed in mg of elemental calcium) tablet 1,250 mg  1,250 mg Oral BID WC Athena Masse, MD   1,250 mg at 02/25/22 0858   cyanocobalamin (VITAMIN B12) tablet 1,000 mcg  1,000 mcg Oral Daily Judd Gaudier V, MD   1,000 mcg at 02/25/22 0940   donepezil (ARICEPT) tablet 10 mg  10 mg Oral Daily Athena Masse, MD   10 mg at 02/25/22 0940  escitalopram (LEXAPRO) tablet 20 mg  20 mg Oral Daily Judd Gaudier V, MD   20 mg at 02/25/22 0939   feeding supplement (ENSURE ENLIVE / ENSURE PLUS) liquid 237 mL  237 mL Oral TID BM Loletha Grayer, MD   237 mL at 02/25/22 1416   haloperidol lactate (HALDOL) injection 0.5 mg  0.5 mg Intravenous Q6H PRN Athena Masse, MD       hydrALAZINE (APRESOLINE) injection 5 mg  5 mg Intravenous Q6H PRN Athena Masse, MD       HYDROmorphone (DILAUDID) injection 0.5 mg  0.5 mg Intravenous Q2H PRN Athena Masse, MD       memantine [facility]) tablet 10 mg  10 mg Oral BID Athena Masse,  MD   10 mg at 02/25/22 0940   mirabegron ER (MYRBETRIQ) tablet 50 mg  50 mg Oral Daily Athena Masse, MD   50 mg at 02/25/22 0940   multivitamin-lutein (OCUVITE-LUTEIN) capsule 1 capsule  1 capsule Oral BID Athena Masse, MD   1 capsule at 02/25/22 0858   ondansetron (ZOFRAN) tablet 4 mg  4 mg Oral Q6H PRN Athena Masse, MD       Or   ondansetron [facility]) injection 4 mg  4 mg Intravenous Q6H PRN Athena Masse, MD       prochlorperazine (COMPAZINE) injection 5 mg  5 mg Intravenous Q4H PRN Athena Masse, MD       simvastatin (ZOCOR) tablet 20 mg  20 mg Oral QHS Athena Masse, MD   20 mg at 02/24/22 2151     Discharge Medications: Please see discharge summary for a list of discharge medications.  Relevant Imaging Results:  Relevant Lab Results:   Additional Information SS# [id number]  Izola Price, RN

## 2022-02-25 NOTE — TOC Progression Note (Signed)
Transition of Care [facility]) - Progression Note    Patient Details  Name: Anne Oneill MRN: [id number] Date of Birth: 1926-08-28  Transition of Care [facility]) CM/SW Contact  Izola Price, RN Phone Number: 02/25/2022, 8:28 PM  Clinical Narrative:  9/17: New Pt recommendations for SNF. DX of Pelvic ring fracture, sequela per provider notes. Spoke with daughter, who patient lives with.  Explained TOC role and bed choice/search process. Medicare list access provided for review of choices. Already [facility] would be first choice, but agreed to bed search in [place] area as well. Informed TOC would follow up with bed offers. FL2 sent for co-sign and CSW referral sent to [facility] area SNF per daughter's verbal permission. Simmie Davies RN CM   [name] (Daughter)  [phone] (Mobile)       Expected Discharge Plan and Services                                                 Social Determinants of Health (SDOH) Interventions    Readmission Risk Interventions     No data to display

## 2022-02-25 NOTE — Evaluation (Signed)
Physical Therapy Evaluation Patient Details Name: Anne Oneill MRN: [id number] DOB: 1926/08/14 Today's Date: 02/25/2022  History of Present Illness  Pt is a 88 y/o female admitted secondary to a fall. X-rays revealed fractures of the superior pubic rami. Ortho was consulted and recommended conservative, non-op treatment with TDWB'ing R LE progressing to WBAT. PMH including but not limited to dementia and cancer.   Clinical Impression  Pt presented sitting upright on BSC with NT and RN present, awake and willing to participate in therapy session. No family/caregivers present on eval and pt with cognitive deficits at baseline and unable to provide any reliable information regarding home set-up or PLOF. At the time of evaluation, pt required mod-max A x2 for transfers and was unable to tolerate ambulation secondary to pain. She was able to accept some [employer] through her R LE with standing and transfers with no knee buckling noted. Pt would continue to benefit from skilled physical therapy services at this time while admitted and after d/c to address the below listed limitations in order to improve overall safety and independence with functional mobility.         Recommendations for follow up therapy are one component of a multi-disciplinary discharge planning process, led by the attending physician.  Recommendations may be updated based on patient status, additional functional criteria and insurance authorization.  Follow Up Recommendations Skilled nursing-short term rehab (<3 hours/day) Can patient physically be transported by private vehicle: No    Assistance Recommended at Discharge Frequent or constant Supervision/Assistance  Patient can return home with the following  Two people to help with walking and/or transfers;A lot of help with bathing/dressing/bathroom;Assist for transportation;Help with stairs or ramp for entrance    Equipment Recommendations Other (comment) (defer to next  venue of care)  Recommendations for Other Services       Functional Status Assessment Patient has had a recent decline in their functional status and demonstrates the ability to make significant improvements in function in a reasonable and predictable amount of time.     Precautions / Restrictions Precautions Precautions: Fall Precaution Comments: incontinent, particularly with standing Restrictions Weight Bearing Restrictions: Yes RLE Weight Bearing: Touchdown weight bearing Other Position/Activity Restrictions: TDWB'ing progressing to WBAT per ortho note, no orders      Mobility  Bed Mobility               General bed mobility comments: pt OOB on BSC upon PT arrival    Transfers Overall transfer level: Needs assistance Equipment used: 2 person hand held assist Transfers: Sit to/from Stand, Bed to chair/wheelchair/BSC Sit to Stand: Mod assist, Max assist, +2 physical assistance Stand pivot transfers: Max assist, +2 physical assistance         General transfer comment: increased time and effort, cueing for safe hand placement and sequencing, mod-max A x2 to power into standing and max A x2 needed to pivot from [facility] to recliner chair towards pt's R side    Ambulation/Gait               General Gait Details: unable to ambulate on eval  Stairs            Wheelchair Mobility    Modified Rankin (Stroke Patients Only)       Balance Overall balance assessment: Needs assistance Sitting-balance support: Feet supported, Bilateral upper extremity supported, Single extremity supported Sitting balance-Leahy Scale: Poor     Standing balance support: Bilateral upper extremity supported Standing balance-Leahy Scale: Poor Standing balance comment:  mod-max A x2                             Pertinent Vitals/Pain Pain Assessment Pain Assessment: Faces Faces Pain Scale: Hurts even more Pain Location: R LE with movement Pain Descriptors /  Indicators: Grimacing, Guarding Pain Intervention(s): Monitored during session, Repositioned    Home Living Family/patient expects to be discharged to:: Unsure                   Additional Comments: per chart review, pt lives with daughter. No family/caregivers present during eval and pt with dementia at baseline and unable to provide any reliable information    Prior Function Prior Level of Function : Patient poor historian/Family not available             Mobility Comments: per chart review, pt lives with daughter. No family/caregivers present during eval and pt with dementia at baseline and unable to provide any reliable information       Hand Dominance        Extremity/Trunk Assessment   Upper Extremity Assessment Upper Extremity Assessment: Defer to OT evaluation    Lower Extremity Assessment Lower Extremity Assessment: Difficult to assess due to impaired cognition;RLE deficits/detail RLE Deficits / Details: pt demonstrated signs of pain (facial grimacing) with movement but able to accept some WB'ing through R LE with transfers       Communication   Communication: No difficulties  Cognition Arousal/Alertness: Awake/alert Behavior During Therapy: WFL for tasks assessed/performed Overall Cognitive Status: History of cognitive impairments - at baseline Area of Impairment: Orientation, Attention, Memory, Following commands, Safety/judgement, Awareness, Problem solving                 Orientation Level: Disoriented to, Situation Current Attention Level: Sustained Memory: Decreased short-term memory, Decreased recall of precautions Following Commands: Follows one step commands inconsistently, Follows one step commands with increased time Safety/Judgement: Decreased awareness of deficits Awareness: Intellectual Problem Solving: Slow processing, Decreased initiation, Difficulty sequencing, Requires verbal cues          General Comments       Exercises     Assessment/Plan    PT Assessment Patient needs continued PT services  PT Problem List Decreased strength;Decreased range of motion;Decreased balance;Decreased activity tolerance;Decreased mobility;Decreased coordination;Decreased cognition;Decreased knowledge of use of DME;Decreased safety awareness;Decreased knowledge of precautions;Pain       PT Treatment Interventions DME instruction;Gait training;Stair training;Therapeutic activities;Therapeutic exercise;Balance training;Functional mobility training;Neuromuscular re-education;Patient/family education    PT Goals (Current goals can be found in the Care Plan section)  Acute Rehab PT Goals Patient Stated Goal: decrease pain PT Goal Formulation: Patient unable to participate in goal setting Time For Goal Achievement: 03/11/22 Potential to Achieve Goals: Fair    Frequency Min 2X/week     Co-evaluation PT/OT/SLP Co-Evaluation/Treatment: Yes Reason for Co-Treatment: For patient/therapist safety;To address functional/ADL transfers PT goals addressed during session: Mobility/safety with mobility;Balance;Strengthening/ROM         AM-PAC PT "6 Clicks" Mobility  Outcome Measure Help needed turning from your back to your side while in a flat bed without using bedrails?: A Lot Help needed moving from lying on your back to sitting on the side of a flat bed without using bedrails?: A Lot Help needed moving to and from a bed to a chair (including a wheelchair)?: A Lot Help needed standing up from a chair using your arms (e.g., wheelchair or bedside chair)?: A Lot Help needed  to walk in hospital room?: Total Help needed climbing 3-5 steps with a railing? : Total 6 Click Score: 10    End of Session Equipment Utilized During Treatment: Gait belt Activity Tolerance: Patient limited by pain Patient left: in chair;with call bell/phone within reach;with chair alarm set Nurse Communication: Mobility status;Other (comment) (pt  needed a new [place]) PT Visit Diagnosis: Other abnormalities of gait and mobility (R26.89);Pain Pain - Right/Left: Right Pain - part of body: Leg    Time: 1050-1105 PT Time Calculation (min) (ACUTE ONLY): 15 min   Charges:   PT Evaluation $PT Eval Moderate Complexity: 1 Mod          Eduard Clos, PT, DPT  Acute Rehabilitation Services Office [place] 02/25/2022, 11:22 AM

## 2022-02-25 NOTE — Progress Notes (Signed)
  Progress Note   Patient: Anne Oneill TXH:[id number] DOB: 1927/02/18 DOA: 02/23/2022     2 DOS: the patient was seen and examined on 02/25/2022    Assessment and Plan: * Pelvic ring fracture, sequela Pain control.  Patient with numerous allergies to pain medications.  On IV Dilaudid currently.  Plan to get the Tylenol as quickly as possible.  PT and OT consultations recommending rehab.  Drop in hemoglobin Hemoglobin dipped from 13.9-11.6.  With being on Eliquis and having a trauma need to watch blood counts closely.  Checking hemoglobin again tomorrow  Sacral fracture, closed (HCC) Pain control.  PT and OT recommending rehab  Osteoporosis Continue calcium and vitamin D  Mixed Alzheimer's and vascular dementia ([place]) Continue Namenda, Aricept, Lexapro   Impaired fasting glucose Check a hemoglobin A1c tomorrow  Thrombocytopenia (HCC) Watch platelet count tomorrow.  Dipped down to 130.  Hypertensive urgency Secondary to pain.  Continue to monitor.  History of TIA (transient ischemic attack) Continue simvastatin  History of DVT (deep vein thrombosis) Continue Eliquis  History of breast cancer No acute issues        Subjective: Patient seen this morning.  Unable to straight leg raise with either leg.  Having some pain.  Physical Exam: Vitals:   02/24/22 1935 02/25/22 0555 02/25/22 0600 02/25/22 0746  BP: (!) 155/65 (!) 164/67 (!) 150/70 (!) 152/63  Pulse: 78 83  82  Resp: '18 17  18  '$ Temp: 100.3 F (37.9 C) 98.9 F (37.2 C)  98.7 F (37.1 C)  TempSrc:  Oral  Oral  SpO2: 93% 91%  93%  Weight:      Height:       Physical Exam HENT:     Head: Normocephalic.     Mouth/Throat:     Pharynx: No oropharyngeal exudate.  Eyes:     General: Lids are normal.     Conjunctiva/sclera: Conjunctivae normal.  Cardiovascular:     Rate and Rhythm: Normal rate and regular rhythm.     Heart sounds: Normal heart sounds, S1 normal and S2 normal.  Pulmonary:     Breath  sounds: No decreased breath sounds, wheezing, rhonchi or rales.  Abdominal:     Palpations: Abdomen is soft.     Tenderness: There is no abdominal tenderness.  Musculoskeletal:     Right lower leg: No swelling.     Left lower leg: No swelling.  Skin:    General: Skin is warm.     Findings: No rash.  Neurological:     Mental Status: She is alert.     Comments: Unable to straight leg raise with either leg.  Unable to hold up either leg for very long when I held up her leg.     Data Reviewed: BUN 28 creatinine 0.91, hemoglobin 11.6 and platelet count 130.  Family Communication: Spoke with patient's daughter on the phone  Disposition: Status is: Inpatient Remains inpatient appropriate because: Patient will need rehab  Planned Discharge Destination: Rehab    Time spent: 28 minutes  Author: Loletha Grayer, MD 02/25/2022 2:15 PM  For on call review [email or web address].

## 2022-02-25 NOTE — Assessment & Plan Note (Addendum)
Resolved.  Platelet count up to 165.

## 2022-02-25 NOTE — Assessment & Plan Note (Signed)
The patient is not a diabetic with her hemoglobin A1c being 5.5.

## 2022-02-26 DIAGNOSIS — S3210XS Unspecified fracture of sacrum, sequela: Secondary | ICD-10-CM | POA: Diagnosis not present

## 2022-02-26 DIAGNOSIS — R102 Pelvic and perineal pain: Secondary | ICD-10-CM

## 2022-02-26 DIAGNOSIS — S32810S Multiple fractures of pelvis with stable disruption of pelvic ring, sequela: Secondary | ICD-10-CM | POA: Diagnosis not present

## 2022-02-26 DIAGNOSIS — R710 Precipitous drop in hematocrit: Secondary | ICD-10-CM | POA: Diagnosis not present

## 2022-02-26 DIAGNOSIS — G309 Alzheimer's disease, unspecified: Secondary | ICD-10-CM | POA: Diagnosis not present

## 2022-02-26 LAB — URINALYSIS, COMPLETE (UACMP) WITH MICROSCOPIC
Bacteria, UA: NONE SEEN
Bilirubin Urine: NEGATIVE
Glucose, UA: NEGATIVE mg/dL
Hgb urine dipstick: NEGATIVE
Ketones, ur: NEGATIVE mg/dL
Leukocytes,Ua: NEGATIVE
Nitrite: NEGATIVE
Protein, ur: NEGATIVE mg/dL
Specific Gravity, Urine: 1.012 (ref 1.005–1.030)
pH: 6 (ref 5.0–8.0)

## 2022-02-26 LAB — BASIC METABOLIC PANEL
Anion gap: 6 (ref 5–15)
BUN: 29 mg/dL — ABNORMAL HIGH (ref 8–23)
CO2: 30 mmol/L (ref 22–32)
Calcium: 8.9 mg/dL (ref 8.9–10.3)
Chloride: 102 mmol/L (ref 98–111)
Creatinine, Ser: 0.94 mg/dL (ref 0.44–1.00)
GFR, Estimated: 57 mL/min — ABNORMAL LOW (ref >60)
Glucose, Bld: 106 mg/dL — ABNORMAL HIGH (ref 70–99)
Potassium: 4.1 mmol/L (ref 3.5–5.1)
Sodium: 138 mmol/L (ref 135–145)

## 2022-02-26 LAB — CBC
HCT: 33.2 % — ABNORMAL LOW (ref 36.0–46.0)
Hemoglobin: 10.9 g/dL — ABNORMAL LOW (ref 12.0–15.0)
MCH: 30.2 pg (ref 26.0–34.0)
MCHC: 32.8 g/dL (ref 30.0–36.0)
MCV: 92 fL (ref 80.0–100.0)
Platelets: 127 10*3/uL — ABNORMAL LOW (ref 150–400)
RBC: 3.61 MIL/uL — ABNORMAL LOW (ref 3.87–5.11)
RDW: 12.7 % (ref 11.5–15.5)
WBC: 8.7 10*3/uL (ref 4.0–10.5)
nRBC: 0 % (ref 0.0–0.2)

## 2022-02-26 LAB — HEMOGLOBIN A1C
Hgb A1c MFr Bld: 5.5 % (ref 4.8–5.6)
Mean Plasma Glucose: 111.15 mg/dL

## 2022-02-26 MED ORDER — POLYETHYLENE GLYCOL 3350 17 G PO PACK
17.0000 g | PACK | Freq: Every day | ORAL | Status: DC
  Administered 2022-02-26 – 2022-02-27 (×2): 17 g via ORAL
  Filled 2022-02-26 (×2): qty 1

## 2022-02-26 MED ORDER — HYDROMORPHONE HCL 2 MG PO TABS
1.0000 mg | ORAL_TABLET | ORAL | Status: DC | PRN
  Administered 2022-02-27: 1 mg via ORAL
  Filled 2022-02-26: qty 1

## 2022-02-26 MED ORDER — BISACODYL 10 MG RE SUPP
10.0000 mg | Freq: Every day | RECTAL | Status: DC | PRN
  Administered 2022-02-26: 10 mg via RECTAL
  Filled 2022-02-26: qty 1

## 2022-02-26 NOTE — Plan of Care (Signed)
  Problem: Clinical Measurements: Goal: Ability to maintain clinical measurements within normal limits will improve Outcome: Progressing   Problem: Clinical Measurements: Goal: Will remain free from infection Outcome: Progressing   Problem: Clinical Measurements: Goal: Diagnostic test results will improve Outcome: Progressing   Problem: Clinical Measurements: Goal: Cardiovascular complication will be avoided Outcome: Progressing   Problem: Coping: Goal: Level of anxiety will decrease Outcome: Progressing   Problem: Nutrition: Goal: Adequate nutrition will be maintained Outcome: Progressing

## 2022-02-26 NOTE — TOC Progression Note (Signed)
Transition of Care [facility]) - Progression Note    Patient Details  Name: Anne Oneill MRN: [id number] Date of Birth: Jan 18, 1927  Transition of Care [facility]) CM/SW [place], RN Phone Number: 02/26/2022, 2:41 PM  Clinical Narrative:     Spoke with the patient's daughter Anne Oneill, Let her know that [facility] does not have a bed, she will go tour other facilities and let me know       Expected Discharge Plan and Services                                                 Social Determinants of Health (SDOH) Interventions    Readmission Risk Interventions     No data to display

## 2022-02-26 NOTE — Assessment & Plan Note (Signed)
Not having suprapubic pain today.  I discontinued Myrbetriq.  The patient is urinating when she stands up.

## 2022-02-26 NOTE — Progress Notes (Signed)
Physical Therapy Treatment Patient Details Name: Anne Oneill MRN: [id number] DOB: November 12, 1926 Today's Date: 02/26/2022   History of Present Illness Pt is a 88 y/o female admitted secondary to a fall. X-rays revealed fractures of the superior pubic rami. Ortho was consulted and recommended conservative, non-op treatment with TDWB'ing R LE progressing to WBAT. PMH including but not limited to dementia and cancer.   PT Comments    Patient is agreeable to PT with encouragement. She required Max A of one person for bed mobility and sit to stand transfers today. Max A required to maintain standing balance with standing tolerance limited to around 5 seconds. Maximal encouragement and multimodal cues needed with significant extra time required with all mobility tasks and frequent rest breaks needed. Pain seems to be the most limiting factor, however patient was pre-medicated prior to session. Recommend to continue PT to maximize independence and decrease caregiver burden. Updated daughter Anne Oneill on the phone.    Recommendations for follow up therapy are one component of a multi-disciplinary discharge planning process, led by the attending physician.  Recommendations may be updated based on patient status, additional functional criteria and insurance authorization.  Follow Up Recommendations  Skilled nursing-short term rehab (<3 hours/day) Can patient physically be transported by private vehicle: No   Assistance Recommended at Discharge Frequent or constant Supervision/Assistance  Patient can return home with the following Two people to help with walking and/or transfers;A lot of help with bathing/dressing/bathroom;Assist for transportation;Help with stairs or ramp for entrance;Direct supervision/assist for medications management;Assistance with cooking/housework   Equipment Recommendations   (to be determined at [facility])    Recommendations for Other Services       Precautions / Restrictions  Precautions Precautions: Fall Restrictions Weight Bearing Restrictions: Yes RLE Weight Bearing: Touchdown weight bearing Other Position/Activity Restrictions: TDWB'ing progressing to WBAT per ortho note, no orders     Mobility  Bed Mobility Overal bed mobility: Needs Assistance Bed Mobility: Supine to Sit, Sit to Supine     Supine to sit: Max assist, HOB elevated Sit to supine: Total assist   General bed mobility comments: multimodal cues required for participation with bed mobility. encouragement provided also. she has her eyes closed for most of the session despite cues to keep eyes opened.    Transfers Overall transfer level: Needs assistance Equipment used: None Transfers: Sit to/from Stand Sit to Stand: Max assist           General transfer comment: verbal cues for hand placement, technique, and for weight bearing through LLE. lifting and lowering assistance provided  with faciliation for anterior weight shifting. patient declined standing again or getting to the chair    Ambulation/Gait               General Gait Details: unable to ambulate at this time due to poor standing tolerance   Stairs             Wheelchair Mobility    Modified Rankin (Stroke Patients Only)       Balance Overall balance assessment: Needs assistance Sitting-balance support: Feet supported, Single extremity supported Sitting balance-Leahy Scale: Fair Sitting balance - Comments: no external support required to maintain sitting balance with RUE supported on the bed rail. cues to keep eyes opened.   Standing balance support: Bilateral upper extremity supported Standing balance-Leahy Scale: Zero Standing balance comment: maximal assistance required. standing tolerance ~ 5 seconds  Cognition Arousal/Alertness: Awake/alert Behavior During Therapy: WFL for tasks assessed/performed Overall Cognitive Status: History of cognitive  impairments - at baseline Area of Impairment: Following commands, Safety/judgement, Awareness, Problem solving, Memory                     Memory: Decreased recall of precautions, Decreased short-term memory Following Commands: Follows one step commands inconsistently, Follows one step commands with increased time Safety/Judgement: Decreased awareness of deficits Awareness: Intellectual Problem Solving: Slow processing, Decreased initiation, Difficulty sequencing, Requires verbal cues, Requires tactile cues General Comments: slow with all mobility, frequent rest breaks required. encouragement needed to participate        Exercises      General Comments General comments (skin integrity, edema, etc.): spoke with daughter Anne Oneill on the phone after PT session with the patient permission to give an update.      Pertinent Vitals/Pain Pain Assessment Pain Assessment: Faces Faces Pain Scale: Hurts even more Pain Location: right posterior pelvis, right leg Pain Descriptors / Indicators: Grimacing, Guarding, Moaning Pain Intervention(s): Limited activity within patient's tolerance, Monitored during session, Premedicated before session, Repositioned, Ice applied    Home Living                          Prior Function            PT Goals (current goals can now be found in the care plan section) Acute Rehab PT Goals Patient Stated Goal: less pain PT Goal Formulation: With family Time For Goal Achievement: 03/11/22 Potential to Achieve Goals: Fair Progress towards PT goals: Progressing toward goals    Frequency    Min 2X/week      PT Plan Current plan remains appropriate    Co-evaluation              AM-PAC PT "6 Clicks" Mobility   Outcome Measure  Help needed turning from your back to your side while in a flat bed without using bedrails?: A Lot Help needed moving from lying on your back to sitting on the side of a flat bed without using bedrails?:  Total Help needed moving to and from a bed to a chair (including a wheelchair)?: Total Help needed standing up from a chair using your arms (e.g., wheelchair or bedside chair)?: Total Help needed to walk in hospital room?: Total Help needed climbing 3-5 steps with a railing? : Total 6 Click Score: 7    End of Session Equipment Utilized During Treatment: Gait belt Activity Tolerance: Patient limited by pain Patient left: in bed;with call bell/phone within reach;with bed alarm set (ice pack applied to right hip) Nurse Communication: Mobility status PT Visit Diagnosis: Other abnormalities of gait and mobility (R26.89);Pain Pain - Right/Left: Right Pain - part of body: Leg     Time: 9675-9163 PT Time Calculation (min) (ACUTE ONLY): 39 min  Charges:  $Therapeutic Activity: 38-52 mins                     Minna Merritts, PT, MPT    Percell Locus 02/26/2022, 12:17 PM

## 2022-02-26 NOTE — Progress Notes (Signed)
Progress Note   Patient: Anne Oneill:[id number] DOB: 02/08/1927 DOA: 02/23/2022     3 DOS: the patient was seen and examined on 02/26/2022     Assessment and Plan: * Pelvic ring fracture, sequela Pain control.  Patient with numerous allergies to pain medications.  On IV Dilaudid currently.  Added oral Dilaudid.  Plan to get the Tylenol as quickly as possible.  PT and OT consultations recommending rehab.  Sacral fracture, closed (HCC) Pain control.  PT and OT recommending rehab  Drop in hemoglobin Hemoglobin dipped from 13.9 to 10.9.  With being on Eliquis and having a trauma need to watch blood counts closely.  Check hemoglobin again tomorrow.  Mixed Alzheimer's and vascular dementia ([place]) Continue Namenda, Aricept, Lexapro   Osteoporosis Continue calcium and vitamin D  Suprapubic pain Likely from not urinating when I saw her.  Nursing staff stands her up and she empties her bladder.  Impaired fasting glucose The patient is not a diabetic with her hemoglobin A1c being 5.5.  Thrombocytopenia (HCC) Watch platelet count tomorrow.  Dipped down to 127.  Hypertensive urgency Secondary to pain.  Continue to monitor.  History of TIA (transient ischemic attack) Continue simvastatin  History of DVT (deep vein thrombosis) Continue Eliquis  History of breast cancer No acute issues        Subjective: Patient states that she has pain around 6 out of 10 in intensity.  Today able to lift the left leg up off the bed a little bit and also right leg up off the bed a little bit.  When I was able to lift up her legs individually, she was able to hold them up in the air for a little while prior to dropping it down on the bed.  Physical Exam: Vitals:   02/25/22 0746 02/25/22 1625 02/25/22 2330 02/26/22 0740  BP: (!) 152/63 (!) 160/61 138/65 (!) 153/68  Pulse: 82 81 84 83  Resp: '18 16 16 16  '$ Temp: 98.7 F (37.1 C) 98.9 F (37.2 C) 98.7 F (37.1 C) 98.8 F (37.1 C)   TempSrc: Oral   Oral  SpO2: 93% 96% 92% 95%  Weight:      Height:       Physical Exam HENT:     Head: Normocephalic.     Mouth/Throat:     Pharynx: No oropharyngeal exudate.  Eyes:     General: Lids are normal.     Conjunctiva/sclera: Conjunctivae normal.  Cardiovascular:     Rate and Rhythm: Normal rate and regular rhythm.     Heart sounds: Normal heart sounds, S1 normal and S2 normal.  Pulmonary:     Breath sounds: No decreased breath sounds, wheezing, rhonchi or rales.  Abdominal:     Palpations: Abdomen is soft.     Tenderness: There is abdominal tenderness in the suprapubic area.  Musculoskeletal:     Right lower leg: No swelling.     Left lower leg: No swelling.  Skin:    General: Skin is warm.     Findings: No rash.  Neurological:     Mental Status: She is alert.     Comments: Slightly able to straight leg raise with bilateral legs.  When I passively lifted up her legs up off the bed she was able to hold them up therefore a little while before slowly bring it down to the bed.     Data Reviewed: Hemoglobin 10.9, platelet count 127, hemoglobin A1c 5.5, creatinine 0.94  Family Communication:  Spoke with patient's daughter and son at the bedside  Disposition: Status is: Inpatient Remains inpatient appropriate because: We will need to set up a rehab bed  Planned Discharge Destination: Rehab    Time spent: 27 minutes  Author: Loletha Grayer, MD 02/26/2022 2:15 PM  For on call review [email or web address].

## 2022-02-26 NOTE — Care Management Important Message (Signed)
Important Message  Patient Details  Name: ZYRA PARRILLO MRN: [id number] Date of Birth: [birth year]   Medicare Important Message Given:  N/A - LOS <3 / Initial given by admissions     Juliann Pulse A Lynae Pederson 02/26/2022, 11:24 AM

## 2022-02-26 NOTE — TOC Progression Note (Signed)
Transition of Care [facility]) - Progression Note    Patient Details  Name: Anne Oneill MRN: [id number] Date of Birth: [birth year]  Transition of Care [facility]) CM/SW [place], RN Phone Number: 02/26/2022, 3:06 PM  Clinical Narrative:     Spoke with the daughter [name] they toured facilities They preferred [facility], we reviewed the Medicare star rating  They chose [facility], they need EMS transport          Expected Discharge Plan and Services                                                 Social Determinants of Health (SDOH) Interventions    Readmission Risk Interventions     No data to display

## 2022-02-26 NOTE — TOC Progression Note (Signed)
Transition of Care [facility]) - Progression Note    Patient Details  Name: Anne Oneill MRN: [id number] Date of Birth: 1926/10/10  Transition of Care [facility]) CM/SW [place], RN Phone Number: 02/26/2022, 12:01 PM  Clinical Narrative:     Spoke with the patient's daughter  She is going to tour the facilities and I will speak with her again        Expected Discharge Plan and Services                                                 Social Determinants of Health (SDOH) Interventions    Readmission Risk Interventions     No data to display

## 2022-02-27 DIAGNOSIS — R710 Precipitous drop in hematocrit: Secondary | ICD-10-CM | POA: Diagnosis not present

## 2022-02-27 DIAGNOSIS — S3210XS Unspecified fracture of sacrum, sequela: Secondary | ICD-10-CM | POA: Diagnosis not present

## 2022-02-27 DIAGNOSIS — R102 Pelvic and perineal pain: Secondary | ICD-10-CM

## 2022-02-27 DIAGNOSIS — G309 Alzheimer's disease, unspecified: Secondary | ICD-10-CM | POA: Diagnosis not present

## 2022-02-27 DIAGNOSIS — S32810S Multiple fractures of pelvis with stable disruption of pelvic ring, sequela: Secondary | ICD-10-CM | POA: Diagnosis not present

## 2022-02-27 LAB — CBC
HCT: 38.1 % (ref 36.0–46.0)
Hemoglobin: 12.4 g/dL (ref 12.0–15.0)
MCH: 29.8 pg (ref 26.0–34.0)
MCHC: 32.5 g/dL (ref 30.0–36.0)
MCV: 91.6 fL (ref 80.0–100.0)
Platelets: 165 10*3/uL (ref 150–400)
RBC: 4.16 MIL/uL (ref 3.87–5.11)
RDW: 12.8 % (ref 11.5–15.5)
WBC: 10.4 10*3/uL (ref 4.0–10.5)
nRBC: 0 % (ref 0.0–0.2)

## 2022-02-27 MED ORDER — ENSURE ENLIVE PO LIQD: 237.0000 mL | Freq: Three times a day (TID) | ORAL | 0 refills | Status: DC

## 2022-02-27 MED ORDER — HYDROMORPHONE HCL 2 MG PO TABS: 1.0000 mg | ORAL_TABLET | Freq: Four times a day (QID) | ORAL | 0 refills | Status: DC | PRN

## 2022-02-27 MED ORDER — OYSTER SHELL CALCIUM/D3 500-5 MG-MCG PO TABS: 1.0000 | ORAL_TABLET | Freq: Two times a day (BID) | ORAL | 0 refills | Status: DC

## 2022-02-27 MED ORDER — POLYETHYLENE GLYCOL 3350 17 G PO PACK: 17.0000 g | PACK | Freq: Every day | ORAL | 0 refills | Status: AC | PRN

## 2022-02-27 MED ORDER — ACETAMINOPHEN 325 MG PO TABS: 650.0000 mg | ORAL_TABLET | Freq: Four times a day (QID) | ORAL | Status: DC | PRN

## 2022-02-27 NOTE — Plan of Care (Signed)
  Problem: Pain Managment: Goal: General experience of comfort will improve 02/27/2022 1130 by Clent Jacks, RN Outcome: Adequate for Discharge 02/27/2022 1130 by Clent Jacks, RN Outcome: Adequate for Discharge   Problem: Safety: Goal: Ability to remain free from injury will improve 02/27/2022 1130 by Clent Jacks, RN Outcome: Adequate for Discharge 02/27/2022 1130 by Clent Jacks, RN Outcome: Adequate for Discharge   Problem: Skin Integrity: Goal: Risk for impaired skin integrity will decrease 02/27/2022 1130 by Clent Jacks, RN Outcome: Adequate for Discharge 02/27/2022 1130 by Clent Jacks, RN Outcome: Adequate for Discharge   Problem: Coping: Goal: Level of anxiety will decrease 02/27/2022 1130 by Clent Jacks, RN Outcome: Adequate for Discharge 02/27/2022 1130 by Clent Jacks, RN Outcome: Adequate for Discharge   Problem: Activity: Goal: Risk for activity intolerance will decrease 02/27/2022 1130 by Clent Jacks, RN Outcome: Adequate for Discharge 02/27/2022 1130 by Clent Jacks, RN Outcome: Adequate for Discharge   Problem: Clinical Measurements: Goal: Will remain free from infection 02/27/2022 1130 by Clent Jacks, RN Outcome: Adequate for Discharge 02/27/2022 1130 by Clent Jacks, RN Outcome: Adequate for Discharge

## 2022-02-27 NOTE — TOC Progression Note (Addendum)
Transition of Care [facility]) - Progression Note    Patient Details  Name: Anne Oneill MRN: [id number] Date of Birth: 16-Jul-1926  Transition of Care [facility]) CM/SW [place], RN Phone Number: 02/27/2022, 11:17 AM  Clinical Narrative:     The [facility] will go to [facility] to room E5, EMS to transport, The daughter [name] is aware  EMS called she is 2nd on list  Spoke with [name] about long term goals She would like help from [facility], I obtained permission for Andee Poles to call and help find a long term placement The patient does have Long term insurance I called Andee Poles and provided her with Marie's number      Expected Discharge Plan and Services           Expected Discharge Date: 02/27/22                                     Social Determinants of Health (SDOH) Interventions    Readmission Risk Interventions     No data to display

## 2022-02-27 NOTE — Care Management Important Message (Signed)
Important Message  Patient Details  Name: Anne Oneill MRN: [id number] Date of Birth: [birth year]   Medicare Important Message Given:  Yes     Juliann Pulse A Kydan Shanholtzer 02/27/2022, 10:45 AM

## 2022-02-27 NOTE — Plan of Care (Signed)

## 2022-02-27 NOTE — Hospital Course (Signed)
88 year old female with past medical history of arthritis DVT, hyperlipidemia, osteoporosis, dementia.  She presented after a fall.  MRI lumbar spine showed acute right sacral ala fractures in zone 1 and 2 extending into the S1 and S2 neuroforaminal foramen, nondisplaced fracture of the right L5 transverse process and multilevel disc bulging and facet arthropathy with varying degrees of mild to moderate spinal canal neural foraminal stenosis.  CT scan of the right hip also showed the right sacral fractures and right anterior acetabulum and superior pubic ramus fracture and right inferior pubic ramus fracture.  With multiple allergies she was given Dilaudid as needed for pain.  Try to use Tylenol first.  Physical therapy recommending rehab.

## 2022-02-27 NOTE — Discharge Summary (Signed)
Physician Discharge Summary   Patient: Anne Oneill MRN: [id number] DOB: 10-11-1926  Admit date:     02/23/2022  Discharge date: 02/27/22  Discharge Physician: Loletha Grayer   PCP: Adin Hector, MD   Recommendations at discharge:   Follow-up team at rehab 1 day Follow-up Dr. Sabra Heck orthopedics 2 weeks  Discharge Diagnoses: Principal Problem:   Pelvic ring fracture, sequela Active Problems:   Sacral fracture, closed (HCC)   Drop in hemoglobin   Mixed Alzheimer's and vascular dementia ([place])   Osteoporosis   History of breast cancer   History of DVT (deep vein thrombosis)   History of TIA (transient ischemic attack)   Hypertensive urgency   Thrombocytopenia (HCC)   Impaired fasting glucose   Suprapubic pain    Hospital Course: 88 year old female with past medical history of arthritis DVT, hyperlipidemia, osteoporosis, dementia.  She presented after a fall.  MRI lumbar spine showed acute right sacral ala fractures in zone 1 and 2 extending into the S1 and S2 neuroforaminal foramen, nondisplaced fracture of the right L5 transverse process and multilevel disc bulging and facet arthropathy with varying degrees of mild to moderate spinal canal neural foraminal stenosis.  CT scan of the right hip also showed the right sacral fractures and right anterior acetabulum and superior pubic ramus fracture and right inferior pubic ramus fracture.  With multiple allergies she was given Dilaudid as needed for pain.  Try to use Tylenol first.  Physical therapy recommending rehab.  Assessment and Plan: * Pelvic ring fracture, sequela Pain control.  Patient with numerous allergies to pain medications.  Try Tylenol first for pain.  We will give a few pills of oral Dilaudid upon going out to rehab.  PT and OT consultations recommending rehab.  Sacral fracture, closed (HCC) Pain control.  PT and OT recommending rehab  Drop in hemoglobin Hemoglobin dipped from 13.9 to 10.9.  Hemoglobin upon  discharge 12.4.  Mixed Alzheimer's and vascular dementia ([place]) Continue Namenda, Aricept, Lexapro   Osteoporosis Continue calcium and vitamin D  Suprapubic pain Not having suprapubic pain today.  I discontinued Myrbetriq.  The patient is urinating when she stands up.  Impaired fasting glucose The patient is not a diabetic with her hemoglobin A1c being 5.5.  Thrombocytopenia ([place]) Resolved.  Platelet count up to 165.  Hypertensive urgency Secondary to pain.  Continue to monitor.  History of TIA (transient ischemic attack) Continue simvastatin  History of DVT (deep vein thrombosis) Continue Eliquis  History of breast cancer No acute issues         Consultants: Orthopedic surgery Procedures performed: None Disposition: Skilled nursing facility Diet recommendation:  Regular diet DISCHARGE MEDICATION: Allergies as of 02/27/2022       Reactions   Codeine Nausea Only   Ditropan [oxybutynin]    Hydrocodone Nausea Only   Morphine Nausea Only   Tramadol Nausea Only   Latex Rash        Medication List     STOP taking these medications    calcium carbonate 1500 (600 Ca) MG Tabs tablet Commonly known as: OSCAL   mirabegron ER 50 MG Tb24 tablet Commonly known as: MYRBETRIQ       TAKE these medications    acetaminophen 325 MG tablet Commonly known as: TYLENOL Take 2 tablets (650 mg total) by mouth every 6 (six) hours as needed for mild pain or moderate pain (or Fever >/= 101).   calcium-vitamin D 500-5 MG-MCG tablet Commonly known as: OSCAL WITH D Take  1 tablet by mouth 2 (two) times daily.   cyanocobalamin 1000 MCG tablet Commonly known as: VITAMIN B12 Take 1,000 mcg by mouth daily.   donepezil 10 MG tablet Commonly known as: ARICEPT Take 10 mg by mouth daily.   Eliquis 2.5 MG Tabs tablet Generic drug: apixaban Take 2.5 mg by mouth 2 (two) times daily.   escitalopram 20 MG tablet Commonly known as: LEXAPRO Take 20 mg by mouth daily.    feeding supplement Liqd Take 237 mLs by mouth 3 (three) times daily between meals.   HYDROmorphone 2 MG tablet Commonly known as: DILAUDID Take 0.5 tablets (1 mg total) by mouth every 6 (six) hours as needed for severe pain.   memantine 10 MG tablet Commonly known as: NAMENDA Take 10 mg by mouth 2 (two) times daily.   polyethylene glycol 17 g packet Commonly known as: MIRALAX / GLYCOLAX Take 17 g by mouth daily as needed.   PRESERVISION AREDS 2+MULTI VIT PO Take 1 Capful by mouth 2 (two) times daily.   simvastatin 20 MG tablet Commonly known as: ZOCOR Take 20 mg by mouth at bedtime.        Contact information for follow-up providers     Earnestine Leys, MD Follow up in 2 week(s).   Specialty: Orthopedic Surgery Why: For re-evaluation Please make an appointment prior to her discharge for follow-up in my office. Contact information: [place] [address] [phone]              Contact information for after-discharge care     Destination     [facility] .   Service: Skilled Nursing Contact information: [address] [place] [phone]                    Discharge Exam: Filed Weights   02/23/22 1505  Weight: 43 kg   Physical Exam HENT:     Head: Normocephalic.     Mouth/Throat:     Pharynx: No oropharyngeal exudate.  Eyes:     General: Lids are normal.     Conjunctiva/sclera: Conjunctivae normal.  Cardiovascular:     Rate and Rhythm: Normal rate and regular rhythm.     Heart sounds: Normal heart sounds, S1 normal and S2 normal.  Pulmonary:     Breath sounds: No decreased breath sounds, wheezing, rhonchi or rales.  Abdominal:     Palpations: Abdomen is soft.     Tenderness: There is abdominal tenderness in the suprapubic area.  Musculoskeletal:     Right lower leg: No swelling.     Left lower leg: No swelling.  Skin:    General: Skin is warm.      Findings: No rash.  Neurological:     Mental Status: She is alert.     Comments: Slightly able to straight leg raise with bilateral legs.       Condition at discharge: stable  The results of significant diagnostics from this hospitalization (including imaging, microbiology, ancillary and laboratory) are listed below for reference.   Imaging Studies: CT Lumbar Spine Wo Contrast  Result Date: 02/23/2022 CLINICAL DATA:  Lower back pain EXAM: CT LUMBAR SPINE WITHOUT CONTRAST TECHNIQUE: Multidetector CT imaging of the lumbar spine was performed without intravenous contrast administration. Multiplanar CT image reconstructions were also generated. RADIATION DOSE REDUCTION: This exam was performed according to the departmental dose-optimization program which includes automated exposure control, adjustment of the mA and/or kV according to  patient size and/or use of iterative reconstruction technique. COMPARISON:  None Available. FINDINGS: Segmentation: 5 lumbar type vertebrae. Alignment: Degenerative grade 1 anterolisthesis at L4-L5 and L5-S1. Vertebrae: There is a nondisplaced fracture of the right L5 transverse process. There is an acute displaced fracture of the right sacral ala, as described on recent pelvis CT, in zones 1 and 2 extending into the right S1 and S2 neural foramen. There is a vertebral body hemangioma of T12. Paraspinal and other soft tissues: Aortoiliac atherosclerosis. Large left renal cyst which requires no follow-up imaging. Left adrenal thickening without discrete nodule. No follow-up recommended. Paraspinal muscle atrophy. Disc levels: There is multilevel disc bulging and facet arthropathy resulting in varying degrees of mild to moderate spinal canal and neural foraminal stenoses, worst at L4-L5 and L5-S1 with severe bilateral facet arthropathy and grade 1 anterolisthesis at this levels. Moderate neural foraminal stenosis and on the right at L4-L5 and on the left at L5-S1. IMPRESSION:  Acute right sacral ala fracture in zones 1 and 2 extending into the S1 and S2 neural foramen. Nondisplaced fracture of the right L5 transverse process. Multilevel disc bulging and facet arthropathy with varying degrees of mild to moderate spinal canal neural foraminal stenosis, worst at L4-L5 and L5-S1, with degenerative grade 1 anterolisthesis at these levels. Electronically Signed   By: Maurine Simmering M.D.   On: 02/23/2022 17:00   CT Cervical Spine Wo Contrast  Result Date: 02/23/2022 CLINICAL DATA:  Fall onto back and right hip.  Pain. EXAM: CT CERVICAL SPINE WITHOUT CONTRAST TECHNIQUE: Multidetector CT imaging of the cervical spine was performed without intravenous contrast. Multiplanar CT image reconstructions were also generated. RADIATION DOSE REDUCTION: This exam was performed according to the departmental dose-optimization program which includes automated exposure control, adjustment of the mA and/or kV according to patient size and/or use of iterative reconstruction technique. COMPARISON:  None Available. FINDINGS: Alignment: 3 mm grade 1 anterolisthesis of C3 on C4, 3 mm grade 1 anterolisthesis of C4 on C5, 1 mm grade 1 anterolisthesis of C5 on C6, and 2 mm retrolisthesis of C6 on C7. The facet joints are appropriately aligned. Mild dextrocurvature centered at C4-5. Skull base and vertebrae: The atlantodens interval is intact. Moderate degenerative changes at the atlantodens interval. Vertebral body heights are maintained. Minimal posterior C3-4 and C4-5, severe diffuse C5-6 and C6-7, and mild right C7-T1 disc space narrowing. No acute fracture is seen. Soft tissues and spinal canal: No prevertebral fluid or swelling. No visible canal hematoma. Disc levels: Multilevel degenerative disc and joint changes including disc space narrowing, uncovertebral hypertrophy, and facet joint hypertrophy contribute to mild left C3-4, mild left C4-5, and minimal bilateral C5-6 neuroforaminal narrowing. No significant  central canal stenosis. Upper chest: There is mild-to-moderate bilateral apical scarring. Other: Mildly heterogeneous thyroid gland consistent with right-greater-than-left goiter. No follow-up imaging necessary recommended. No cervical chain lymphadenopathy. IMPRESSION: 1. Grade 1 anterolisthesis of C3 on C4, C4 on C5, and C5 on C6, and mild retrolisthesis of C6 on C7. 2. Multilevel degenerative disc and joint changes as above. 3. No acute fracture or static spondylolisthesis. Electronically Signed   By: Yvonne Kendall M.D.   On: 02/23/2022 16:55   CT Hip Right Wo Contrast  Result Date: 02/23/2022 CLINICAL DATA:  Fall, pain EXAM: CT OF THE RIGHT HIP WITHOUT CONTRAST TECHNIQUE: Multidetector CT imaging of the right hip was performed according to the standard protocol. Multiplanar CT image reconstructions were also generated. RADIATION DOSE REDUCTION: This exam was performed according to the  departmental dose-optimization program which includes automated exposure control, adjustment of the mA and/or kV according to patient size and/or use of iterative reconstruction technique. COMPARISON:  Same day radiograph. FINDINGS: Bones/Joint/Cartilage Acute displaced fracture involving the junction of the anterior acetabulum and superior pubic ramus. There is an acute displaced fracture of the right inferior pubic ramus extending through the ischial tuberosity. There is an acute fracture of the right sacral ala and zones 1 and 2 with involvement of the S1 and S2 foramen. There is a mildly displaced left inferior pubic ramus fracture and a nondisplaced fracture involving the left anterior acetabulum extending into the puboacetabular junction. Prior right proximal femur fixation without evidence of hardware complication. There is irregular sclerosis of the central superior and inferior pubic rami bilaterally. Old healed fracture of S3. Ligaments Suboptimally assessed by CT. Muscles and Tendons No acute myotendinous abnormality  by CT. No intramuscular collection. Soft tissues No focal fluid collection. There is an mild soft tissue swelling adjacent to the fractures. Prior hysterectomy. Sigmoid diverticulosis. Vascular calcifications. IMPRESSION: Acute displaced fractures of the right puboacetabular junction and of the inferior pubic ramus extending through the ischial tuberosity. Acute displaced fracture of the right sacral ala in zones 1 and 2 extending through the right S1 and S2 neural foramen. Acute, mildly displaced fracture of the left inferior pubic ramus and nondisplaced fracture involving the left anterior acetabulum extending into the puboacetabular junction. Prior right proximal femur fixation without evidence of acute hardware complication. Indeterminate irregular mixed sclerotic lesions involving the central superior and inferior pubic rami. This is favored to be benign, however would recommend correlation with any prior history of malignancy and any prior imaging, if available. Electronically Signed   By: Maurine Simmering M.D.   On: 02/23/2022 16:50   CT Head Wo Contrast  Result Date: 02/23/2022 CLINICAL DATA:  Mechanical fall onto back and right hip. Patient denies head injury. EXAM: CT HEAD WITHOUT CONTRAST TECHNIQUE: Contiguous axial images were obtained from the base of the skull through the vertex without intravenous contrast. RADIATION DOSE REDUCTION: This exam was performed according to the departmental dose-optimization program which includes automated exposure control, adjustment of the mA and/or kV according to patient size and/or use of iterative reconstruction technique. COMPARISON:  CT brain 02/24/2020 FINDINGS: Brain: There is moderate cortical atrophy, unchanged from prior and within normal limits for patient age. The ventricles are normal in configuration. The basilar cisterns are patent. There is again a left parafalcine calcification compatible with an unchanged meningioma measuring up to 3 x 9 x 11 mm  (transverse by AP by craniocaudal). Left posterior cerebellar hemisphere calcification is also again unchanged and chronic. No acute intracranial hemorrhage is seen. No abnormal extra-axial fluid collection. Moderate periventricular and subcortical white matter patchy hypodensities are not simply changed, likely chronic ischemic white matter changes. Preservation of the normal cortical gray-white interface without CT evidence of an acute major vascular territorial cortical based infarction. Vascular: No hyperdense vessel or unexpected calcification. There are skull base intracranial atherosclerotic calcifications. Skull: Unchanged small anterior left frontal osteoma. Normal. Negative for fracture or focal lesion. Sinuses/Orbits: Status post bilateral ocular lens replacements. The visualized paranasal sinuses and mastoid air cells are clear. Other: None. IMPRESSION: 1. No significant change from prior.  No acute intracranial process. 2. Moderate cerebral atrophy and chronic microvascular ischemic changes. 3. Unchanged left parafalcine meningioma. Electronically Signed   By: Yvonne Kendall M.D.   On: 02/23/2022 16:44   DG Hip Unilat W or Wo Pelvis 2-3  Views Right  Result Date: 02/23/2022 CLINICAL DATA:  fall, pain EXAM: DG HIP (WITH OR WITHOUT PELVIS) 2-3V RIGHT COMPARISON:  None Available. FINDINGS: Osteopenia. There is a comminuted fracture of the RIGHT superior and inferior pubic ramus. There is mild inferior displacement of the medial fragments. Status post intramedullary rod fixation of the RIGHT femur. IMPRESSION: Comminuted and displaced fracture of the superior and inferior RIGHT pubic ramus. CT may be of assistance to assess for any intra acetabular extension. Electronically Signed   By: Valentino Saxon M.D.   On: 02/23/2022 15:46    Microbiology: No results found for this or any previous visit.  Labs: CBC: Recent Labs  Lab 02/23/22 1741 02/25/22 0500 02/26/22 0608 02/27/22 0423  WBC 12.2*  9.9 8.7 10.4  NEUTROABS 11.1*  --   --   --   HGB 13.9 11.6* 10.9* 12.4  HCT 43.0 36.0 33.2* 38.1  MCV 92.1 93.0 92.0 91.6  PLT 155 130* 127* 189   Basic Metabolic Panel: Recent Labs  Lab 02/23/22 1741 02/25/22 0500 02/26/22 0608  NA 139 137 138  K 3.8 4.5 4.1  CL 105 102 102  CO2 '29 27 30  '$ GLUCOSE 169* 130* 106*  BUN 16 28* 29*  CREATININE 0.85 0.91 0.94  CALCIUM 9.1 9.5 8.9     Discharge time spent: greater than 30 minutes.  Signed: Loletha Grayer, MD [facility] 02/27/2022

## 2022-07-13 ENCOUNTER — Emergency Department: Payer: Medicare Other

## 2022-07-13 ENCOUNTER — Emergency Department
Admission: EM | Admit: 2022-07-13 | Discharge: 2022-07-13 | Disposition: A | Discharge status: Home / Self Care | Payer: Medicare Other | Attending: Emergency Medicine | Admitting: Emergency Medicine

## 2022-07-13 ENCOUNTER — Other Ambulatory Visit: Payer: Self-pay

## 2022-07-13 DIAGNOSIS — W19XXXA Unspecified fall, initial encounter: Secondary | ICD-10-CM | POA: Insufficient documentation

## 2022-07-13 DIAGNOSIS — R0789 Other chest pain: Secondary | ICD-10-CM | POA: Insufficient documentation

## 2022-07-13 DIAGNOSIS — F0390 Unspecified dementia without behavioral disturbance: Secondary | ICD-10-CM | POA: Diagnosis not present

## 2022-07-13 DIAGNOSIS — Z7901 Long term (current) use of anticoagulants: Secondary | ICD-10-CM | POA: Insufficient documentation

## 2022-07-13 DIAGNOSIS — S0990XA Unspecified injury of head, initial encounter: Secondary | ICD-10-CM | POA: Diagnosis present

## 2022-07-13 LAB — CK: Total CK: 45 U/L (ref 38–234)

## 2022-07-13 LAB — BASIC METABOLIC PANEL
Anion gap: 8 (ref 5–15)
BUN: 14 mg/dL (ref 8–23)
CO2: 27 mmol/L (ref 22–32)
Calcium: 9.6 mg/dL (ref 8.9–10.3)
Chloride: 104 mmol/L (ref 98–111)
Creatinine, Ser: 0.88 mg/dL (ref 0.44–1.00)
GFR, Estimated: >60 mL/min (ref >60)
Glucose, Bld: 120 mg/dL — ABNORMAL HIGH (ref 70–99)
Potassium: 3.6 mmol/L (ref 3.5–5.1)
Sodium: 139 mmol/L (ref 135–145)

## 2022-07-13 LAB — CBC
HCT: 43.5 % (ref 36.0–46.0)
Hemoglobin: 14 g/dL (ref 12.0–15.0)
MCH: 29.5 pg (ref 26.0–34.0)
MCHC: 32.2 g/dL (ref 30.0–36.0)
MCV: 91.8 fL (ref 80.0–100.0)
Platelets: 231 10*3/uL (ref 150–400)
RBC: 4.74 MIL/uL (ref 3.87–5.11)
RDW: 12.7 % (ref 11.5–15.5)
WBC: 7.1 10*3/uL (ref 4.0–10.5)
nRBC: 0 % (ref 0.0–0.2)

## 2022-07-13 LAB — TROPONIN I (HIGH SENSITIVITY): Troponin I (High Sensitivity): 10 ng/L (ref <18)

## 2022-07-13 MED ORDER — LIDOCAINE 5 % EX PTCH: 1.0000 | MEDICATED_PATCH | CUTANEOUS | Status: DC

## 2022-07-13 NOTE — ED Triage Notes (Signed)
Pt to ED via POV from [facility]. Pt resides at [place]. Pt had unwitnessed fall last pm. Pt has tenderness to right temple. Pt is on Elqiuis. Pt is poor historian and hx dementia.

## 2022-07-13 NOTE — Discharge Instructions (Addendum)
No bleeding in the brain, possible R Seventh rib fracture.  Return to the ER if you develop worsening symptoms or any other concerns.   1. Age indeterminate fracture involving the anterior aspect of the  right seventh rib.  2. Chronic appearing right posterolateral third and fourth rib  deformities.  3. Indeterminate 1 cm nodule within the posterior left upper lobe.  Consider follow-up with dedicated upright PA and lateral chest  radiograph for more definitive characterization.  4. No acute cardiopulmonary abnormalities.

## 2022-07-13 NOTE — ED Provider Notes (Signed)
[facility] Provider Note    Event Date/Time   First MD Initiated Contact with Patient 07/13/22 1125     (approximate)   History   Fall   HPI  Anne Oneill is a 89 y.o. female with history of dementia, poor historian who comes in for a fall.  patient does have Eliquis on her medication list patient asked about her baseline orientation status.  Patient both sisters are there.  They report that she was actually found in her bed not on the ground but she had reported that she had a fall to the staff and had reported some right-sided chest discomfort earlier this morning.  She states that it happened overnight time.  Is unclear if there is any LOC but seems less likely.  She currently denies any chest pain, abdominal pain.  Able to move all of her arms and legs without any difficulty but there was concern that she hit her head therefore sent here for CT imaging. Physical Exam   Triage Vital Signs: ED Triage Vitals [07/13/22 1112]  Enc Vitals Group     BP (!) 142/78     Pulse Rate 80     Resp 16     Temp 97.8 F (36.6 C)     Temp Source Oral     SpO2 96 %     Weight      Height      Head Circumference      Peak Flow      Pain Score      Pain Loc      Pain Edu?      Excl. in [place]?     Most recent vital signs: Vitals:   07/13/22 1112  BP: (!) 142/78  Pulse: 80  Resp: 16  Temp: 97.8 F (36.6 C)  SpO2: 96%     General: Awake, no distress.  CV:  Good peripheral perfusion.  Resp:  Normal effort.  Abd:  No distention.  Other:  No C TL spine tenderness.  Able to lift both legs up off the bed.  Good grip strength.  Breath sounds bilaterally.  Abdomen soft and nontender No chest wall pain at this time- reported R chest wall pain earlier this morning.    ED Results / Procedures / Treatments   Labs (all labs ordered are listed, but only abnormal results are displayed) Labs Reviewed  BASIC METABOLIC PANEL - Abnormal; Notable for the following  components:      Result Value   Glucose, Bld 120 (*)    All other components within normal limits  CBC  CK  TROPONIN I (HIGH SENSITIVITY)     EKG  My interpretation of EKG:  Normal sinus rate of 77 without any ST elevation or T wave inversions, normal intervals  RADIOLOGY I have reviewed the CT personally interpreted no evidence of intracranial hemorrhage  PROCEDURES:  Critical Care performed: No  Procedures   MEDICATIONS ORDERED IN ED: Medications - No data to display   IMPRESSION / MDM / [place] / ED COURSE  I reviewed the triage vital signs and the nursing notes.   Patient's presentation is most consistent with acute presentation with potential threat to life or bodily function.   Differential is intracranial hemorrhage, cervical fracture, rib fracture, pneumothorax, Electra abnormalities, AKI, ACS.  No chest wall pain at this time to suggest significant rib fracture to get Tylenol this morning which most likely helped with patient's pain.  Suspect  most likely contusion but x-ray ordered to evaluate for rib fracture.   Declined pain meds- denies pain at this time.    CK is normal.  CBC normal.  BMP normal   CT head and neck are negative  IMPRESSION: 1. Age indeterminate fracture involving the anterior aspect of the right seventh rib. 2. Chronic appearing right posterolateral third and fourth rib deformities. 3. Indeterminate 1 cm nodule within the posterior left upper lobe. Consider follow-up with dedicated upright PA and lateral chest radiograph for more definitive characterization. 4. No acute cardiopulmonary abnormalities.    Possible age-indeterminate fracture but patient has no pain at this time.  Offered incentive spirometer but declined.  We discussed the indeterminate nodule and they will follow this up with her PCP.  Troponin was negative.  We discussed repeat opted to decline given low suspicion given this happened sometime overnight  time low suspicion for ACS.  At this time they are comfortable with discharge home  Discussed UA but recent treatment for UTI and denies any new symptoms.  I discussed the provisional nature of ED diagnosis, the treatment so far, the ongoing plan of care, follow up appointments and return precautions with the patient and any family or support people present. They expressed understanding and agreed with the plan, discharged home.     FINAL CLINICAL IMPRESSION(S) / ED DIAGNOSES   Final diagnoses:  Fall, initial encounter  Minor head injury, initial encounter     Rx / DC Orders   ED Discharge Orders     None        Note:  This document was prepared using Dragon voice recognition software and may include unintentional dictation errors.   Vanessa Lyndon, MD 07/13/22 1240

## 2022-07-27 ENCOUNTER — Emergency Department: Payer: Medicare Other

## 2022-07-27 ENCOUNTER — Other Ambulatory Visit: Payer: Self-pay

## 2022-07-27 ENCOUNTER — Emergency Department
Admission: EM | Admit: 2022-07-27 | Discharge: 2022-07-27 | Disposition: A | Discharge status: Home / Self Care | Payer: Medicare Other | Attending: Emergency Medicine | Admitting: Emergency Medicine

## 2022-07-27 DIAGNOSIS — W01198A Fall on same level from slipping, tripping and stumbling with subsequent striking against other object, initial encounter: Secondary | ICD-10-CM | POA: Insufficient documentation

## 2022-07-27 DIAGNOSIS — Y92002 Bathroom of unspecified non-institutional (private) residence single-family (private) house as the place of occurrence of the external cause: Secondary | ICD-10-CM | POA: Insufficient documentation

## 2022-07-27 DIAGNOSIS — W19XXXA Unspecified fall, initial encounter: Secondary | ICD-10-CM

## 2022-07-27 DIAGNOSIS — M542 Cervicalgia: Secondary | ICD-10-CM | POA: Insufficient documentation

## 2022-07-27 DIAGNOSIS — Z853 Personal history of malignant neoplasm of breast: Secondary | ICD-10-CM | POA: Diagnosis not present

## 2022-07-27 DIAGNOSIS — R0781 Pleurodynia: Secondary | ICD-10-CM | POA: Diagnosis not present

## 2022-07-27 DIAGNOSIS — F0390 Unspecified dementia without behavioral disturbance: Secondary | ICD-10-CM | POA: Diagnosis not present

## 2022-07-27 DIAGNOSIS — I160 Hypertensive urgency: Secondary | ICD-10-CM | POA: Diagnosis not present

## 2022-07-27 DIAGNOSIS — Z86011 Personal history of benign neoplasm of the brain: Secondary | ICD-10-CM | POA: Insufficient documentation

## 2022-07-27 DIAGNOSIS — Z85828 Personal history of other malignant neoplasm of skin: Secondary | ICD-10-CM | POA: Insufficient documentation

## 2022-07-27 MED ORDER — ACETAMINOPHEN 500 MG PO TABS
500.0000 mg | ORAL_TABLET | Freq: Once | ORAL | Status: AC
  Administered 2022-07-27: 500 mg via ORAL

## 2022-07-27 MED ORDER — ACETAMINOPHEN 500 MG PO TABS
1000.0000 mg | ORAL_TABLET | Freq: Once | ORAL | Status: DC
  Filled 2022-07-27: qty 2

## 2022-07-27 NOTE — Discharge Instructions (Addendum)
Your CAT scan of your head and neck did not show any acute injuries.  Your chest x-ray did not show any obvious new rib fracture.

## 2022-07-27 NOTE — ED Provider Notes (Signed)
[facility] Provider Note    Event Date/Time   First MD Initiated Contact with Patient 07/27/22 1731     (approximate)   History   Fall   HPI  Anne Oneill is a 89 y.o. female past medical history of DVT on Eliquis, hyperlipidemia, dementia who presents after a fall.  Patient cannot provide me much information but she says that she fell because she lost her balance.  She is not sure where she was or what she was doing at the time.  Her daughter says that she was in the bathroom with the door closed when they heard her fall.  She was conscious when they came in.  She had slipped back in the toilet did hit her head against the wall.  Patient complains of some neck pain denies paresthesias denies headache vomiting chest pain abdominal pain.  Says she is otherwise feeling well.  Patient's daughter says she has had 3 falls in the last month.     Past Medical History:  Diagnosis Date   Arthritis    Cancer ([place])    Dementia ([place])    mild   DVT (deep venous thrombosis) (HCC)    High cholesterol    Hyperlipemia    Osteoporosis    Squamous cell carcinoma of skin 02/19/2007   R cheek    Patient Active Problem List   Diagnosis Date Noted   Suprapubic pain 02/26/2022   Drop in hemoglobin 02/25/2022   Thrombocytopenia ([place]) 02/25/2022   Impaired fasting glucose 02/25/2022   Pelvic ring fracture, sequela 02/24/2022   Sacral fracture, closed ([place]) 02/24/2022   Hypertensive urgency 02/23/2022   Dyspepsia 01/04/2020   Hiatal hernia 01/04/2020   History of breast cancer 01/04/2020   History of DVT (deep vein thrombosis) 01/04/2020   History of TIA (transient ischemic attack) 01/04/2020   Hyperlipidemia 01/04/2020   Mitral valve prolapse 01/04/2020   Osteoporosis 01/04/2020   Mixed Alzheimer's and vascular dementia ([place]) 09/06/2019   Meningioma ([place]) 02/11/2018   Mild cognitive impairment 12/11/2017     Physical Exam  Triage Vital Signs: ED Triage  Vitals  Enc Vitals Group     BP 07/27/22 1732 (!) 179/87     Pulse Rate 07/27/22 1732 82     Resp 07/27/22 1732 16     Temp 07/27/22 1732 97.9 F (36.6 C)     Temp Source 07/27/22 1732 Oral     SpO2 07/27/22 1726 98 %     Weight --      Height --      Head Circumference --      Peak Flow --      Pain Score 07/27/22 1726 0     Pain Loc --      Pain Edu? --      Excl. in [place]? --     Most recent vital signs: Vitals:   07/27/22 1732 07/27/22 1805  BP: (!) 179/87 (!) 178/85  Pulse: 82 84  Resp: 16 (!) 26  Temp: 97.9 F (36.6 C)   SpO2: 98% 97%     General: Awake, no distress.  CV:  Good peripheral perfusion.  Resp:  Normal effort.  Abd:  No distention.  Neuro:             Awake, Alert, Oriented x 1  Other:   +C-spine tenderness, able to range the neck bilaterally, 5 out of 5 strength in bilateral upper and lower extremities No signs of trauma to  the head neck or face Mild tenderness palpation bilateral anterior ribs, no crepitus, lung sounds are clear Pelvis stable nontender Able to range both hips without difficulty No T or L-spine tenderness   ED Results / Procedures / Treatments  Labs (all labs ordered are listed, but only abnormal results are displayed) Labs Reviewed - No data to display   EKG     RADIOLOGY I reviewed and interpreted the CT scan of the brain which does not show any acute intracranial process    PROCEDURES:  Critical Care performed: No  Procedures    MEDICATIONS ORDERED IN ED: Medications  acetaminophen (TYLENOL) tablet 500 mg (500 mg Oral Given 07/27/22 1813)     IMPRESSION / MDM / [place] / ED COURSE  I reviewed the triage vital signs and the nursing notes.                              Patient's presentation is most consistent with acute complicated illness / injury requiring diagnostic workup.  Differential diagnosis includes, but is not limited to, mechanical fall, syncope, intracranial hemorrhage, skull  fracture, concussion, pneumothorax, C-spine fracture, ligamentous injury C-spine   The patient is a 89 year old female on Eliquis who presents after a fall.  She was in the bathroom apparently was sitting down when she fell backward did hit her head but family does not think she lost consciousness.  On exam patient appears well and has no overt complaints.  She is oriented to self only which per her daughter is her baseline.  Does have some midline C-spine tenderness but has good strength in the upper extremities is able to range the neck no T or L-spine tenderness.  Has mild anterior rib tenderness bilaterally no crepitus lung sounds are clear abdomen benign pelvis is nontender.  Obtain CT head and C-spine as well as two-view chest x-ray.  There is no acute findings on CT head or C-spine.  Chest x-ray read as bibasilar atelectasis but no obvious rib fracture.  Patient was hypertensive in the ED, daughter says she is typically hypertensive on blood pressure cuffs in the ED.  Given she is otherwise at her baseline with no other concern for traumatic injuries I think she is appropriate to be discharged home.      FINAL CLINICAL IMPRESSION(S) / ED DIAGNOSES   Final diagnoses:  Fall, initial encounter     Rx / DC Orders   ED Discharge Orders     None        Note:  This document was prepared using Dragon voice recognition software and may include unintentional dictation errors.   Rada Hay, MD 07/27/22 [phone]

## 2022-07-27 NOTE — ED Triage Notes (Signed)
Pt came in via [facility] from [facility]. Pt was on toilet and was slouched over when found and hit head on wall mildly. Son was in other room. Pt is not complaining of any pain, headache or vision changes. Pt is on blood thinners. Hx of dementia. EMS vitals 203/98, 99% RA, HR 88. CBG 119.

## 2022-08-06 ENCOUNTER — Telehealth: Payer: Self-pay

## 2022-08-06 NOTE — Telephone Encounter (Signed)
Palliative care outreach to patients daughter, Anne Oneill, to discuss new PC referral and schedule in person visit.  Call unsuccessful. SW LVM.

## 2022-08-07 ENCOUNTER — Non-Acute Institutional Stay: Payer: Medicare Other

## 2022-08-07 ENCOUNTER — Non-Acute Institutional Stay: Payer: Medicare Other | Admitting: Hospice

## 2022-08-07 DIAGNOSIS — R269 Unspecified abnormalities of gait and mobility: Secondary | ICD-10-CM

## 2022-08-07 DIAGNOSIS — F339 Major depressive disorder, recurrent, unspecified: Secondary | ICD-10-CM

## 2022-08-07 DIAGNOSIS — Z515 Encounter for palliative care: Secondary | ICD-10-CM

## 2022-08-07 DIAGNOSIS — F0390 Unspecified dementia without behavioral disturbance: Secondary | ICD-10-CM

## 2022-08-07 NOTE — Progress Notes (Signed)
TELEPHONE ENCOUNTER  Patients daughter, [name], returned PC SW TC to discuss PC referral.  Daughter shared that patient is at [facility] memory care. Daughter states that patient mixed alzheimer's diease and feels that patient has declined functionally over the past few weeks and a fall risk. Per daughter, patient is requiring more support but is not ready to transition to a higher level of care.   Interventions & plan: SW provided daughter names of private caregivers to assist with sitting with patient during the times patients daughters are not available.  PC NP is scheduled to see patient for initial PC consult/visit today.

## 2022-08-07 NOTE — Progress Notes (Signed)
[facility] Consult Note Telephone: [phone]  Fax: [phone]  PATIENT NAME: Anne Oneill [address] [place] [address] [phone] (home)  DOB: [birth year] MRN: [id number]  PRIMARY CARE PROVIDER:    Adin Hector, MD,  [address] [place] [address] [phone]  REFERRING PROVIDER:   Adin Hector, MD [facility] [place],  [place] [address] [phone]  RESPONSIBLE PARTY:  Anne Oneill is a Advertising account planner Information     Name Relation Home Work Anne Oneill Daughter   [phone]   Anne,Oneill Daughter   [phone]        I met face to face with patient in the facility. Visit to build trust and highlight Palliative Medicine as specialized medical care for people living with serious illness, aimed at facilitating better quality of life through symptoms relief, assisting with advance care planning and complex medical decision making.  NP called Anne Oneill and updated her on visit. She provided additional history since patient has Dementia. She also requested that for next visit to call her baby brother Anne Oneill [phone] - to bring on same page with the rest of the family.  Palliative care team will continue to support patient, patient's family, and medical team.  ASSESSMENT AND / RECOMMENDATIONS:  -------------------------------------------------------------------------------------------------------------------------------------- Advance Care Planning: Our advance care planning conversation included a discussion about:    The value and importance of advance care planning  Difference between Hospice and Palliative care Exploration of goals of care in the event of a sudden injury or illness  Identification and preparation of a healthcare agent  Review and updating or creation of an  advance directive document  . Decision not to resuscitate or to de-escalate disease focused treatments due to poor prognosis.  CODE STATUS: Patient is a Do Not Resuscitate  Goals of Care: Goals include to maximize quality of life and symptom management. Family is open to hospice service in the future.   I spent 20  minutes providing this initial consultation. More than 50% of the time in this consultation was spent on counseling patient and coordinating communication. --------------------------------------------------------------------------------------------------------------------------------------  Symptom Management/Plan: Dementia: Progressing memory loss/confusion, impoverished thought, limited language in line with Dementia disease trajectory. Continue Namenda, Aricept. FAST 6D. Fall/safety precautions Depression: Continue Escitalopram. Encourage socialization and participation in facility activities.  Gait distrubance: frequent falls, three in a month, last seen in ED 07/27/22 for a fall likely due to generalized weakness. She hit her head; CT scan of the brain which does not show any acute intracranial process.  PT/OT for strengthening and gait training. Routine CBC CMP.  Follow up: Palliative care will continue to follow for complex medical decision making, advance care planning, and clarification of goals. Return 6 weeks or prn. Encouraged to call provider sooner with any concerns.   Family /Caregiver/Community Supports: Patient in AL for ongoing care  HOSPICE ELIGIBILITY/DIAGNOSIS: TBD  Chief Complaint: Initial Palliative care visit  HISTORY OF PRESENT ILLNESS:  Anne Oneill is a 89 y.o. year old female  with multiple morbidities requiring close monitoring and with high risk of complications and  mortality:  Dementia, Depression, gait disturbance/fall. History of DVT, skin cancer, hyperlipidemia, osteoporosis.  History obtained from review of EMR, discussion with primary team, caregiver, family and/or [name]. Patient in no acute distress, denied pain/discomfort. She was initially watching TV with another resident  in her room, who later  left as visit started.  Review and summarization of Epic records shows history from other than patient. Rest of 10 point ROS asked and negative. Independent interpretation of tests and reviewed as needed, available labs, patient records, imaging, studies and related documents from the EMR.  PHYSICAL EXAM: GEN: in no acute distress, FLACC 0  Cardiac: S1 S2, no LE edema feet/ankles  Respiratory:  clear to auscultation bilaterally, GI: soft, nontender, nondistended, + BS   MS: Moving all extremities, no edema to BLE, gait disturbance   Skin: warm and dry, no rash to visible skin Neuro: generalized weakness, A and O x 2, memory loss Psych: non-anxious affect PAST MEDICAL HISTORY:  Active Ambulatory Problems    Diagnosis Date Noted   Dyspepsia 01/04/2020   Hiatal hernia 01/04/2020   History of breast cancer 01/04/2020   History of DVT (deep vein thrombosis) 01/04/2020   History of TIA (transient ischemic attack) 01/04/2020   Hyperlipidemia 01/04/2020   Meningioma ([place]) 02/11/2018   Mild cognitive impairment 12/11/2017   Mitral valve prolapse 01/04/2020   Mixed Alzheimer's and vascular dementia ([place]) 09/06/2019   Osteoporosis 01/04/2020   Hypertensive urgency 02/23/2022   Pelvic ring fracture, sequela 02/24/2022   Sacral fracture, closed ([place]) 02/24/2022   Drop in hemoglobin 02/25/2022   Thrombocytopenia ([place]) 02/25/2022   Impaired fasting glucose 02/25/2022   Suprapubic pain 02/26/2022   Resolved Ambulatory Problems    Diagnosis Date Noted   No Resolved Ambulatory Problems   Past Medical History:  Diagnosis Date   Arthritis    Cancer ([place])    Dementia ([place])    DVT (deep venous thrombosis) (HCC)    High cholesterol    Hyperlipemia    Squamous cell carcinoma of skin 02/19/2007    SOCIAL HX:  Social History   Tobacco Use   Smoking  status: Never   Smokeless tobacco: Never  Substance Use Topics   Alcohol use: Not Currently     FAMILY HX: No family history on file.    ALLERGIES:  Allergies  Allergen Reactions   Codeine Nausea Only   Ditropan [Oxybutynin]    Hydrocodone Nausea Only   Morphine Nausea Only   Tramadol Nausea Only   Latex Rash      PERTINENT MEDICATIONS:  Outpatient Encounter Medications as of 08/07/2022  Medication Sig   acetaminophen (TYLENOL) 500 MG tablet Take 500 mg by mouth every 8 (eight) hours as needed for mild pain.   apixaban (ELIQUIS) 2.5 MG TABS tablet Take 2.5 mg by mouth 2 (two) times daily.   calcium-vitamin D (OSCAL WITH D) 500-5 MG-MCG tablet Take 1 tablet by mouth 2 (two) times daily.   cyanocobalamin (VITAMIN B12) 1000 MCG tablet Take 1,000 mcg by mouth daily.   donepezil (ARICEPT) 10 MG tablet Take 10 mg by mouth daily.   escitalopram (LEXAPRO) 20 MG tablet Take 20 mg by mouth daily.   feeding supplement (ENSURE ENLIVE / ENSURE PLUS) LIQD Take 237 mLs by mouth 3 (three) times daily between meals.   HYDROmorphone (DILAUDID) 2 MG tablet Take 0.5 tablets (1 mg total) by mouth every 6 (six) hours as needed for severe pain. (Patient not taking: Reported on 07/27/2022)   memantine (NAMENDA) 10 MG tablet Take 10 mg by mouth 2 (two) times daily.   mirabegron ER (MYRBETRIQ) 50 MG TB24 tablet Take 50 mg by mouth daily.   Multiple Vitamins-Minerals (PRESERVISION AREDS 2+MULTI VIT PO) Take 1 capsule by mouth 2 (two) times daily.   polyethylene glycol (MIRALAX /  GLYCOLAX) 17 g packet Take 17 g by mouth daily as needed.   simvastatin (ZOCOR) 20 MG tablet Take 20 mg by mouth at bedtime.   No facility-administered encounter medications on file as of 08/07/2022.     Thank you for the opportunity to participate in the care of Ms. Gellerman.  The palliative care team will continue to follow. Please call our office at [phone] if we can be of additional assistance.   Note: Portions of this  note were generated with Lobbyist. Dictation errors may occur despite best attempts at proofreading.  Teodoro Spray, NP

## 2022-08-07 NOTE — Addendum Note (Signed)
Addended by: Laverda Sorenson I on: 08/07/2022 04:22 PM   Modules accepted: Level of Service

## 2022-08-13 ENCOUNTER — Other Ambulatory Visit: Payer: Self-pay | Admitting: Student

## 2022-08-13 DIAGNOSIS — F0150 Vascular dementia without behavioral disturbance: Secondary | ICD-10-CM

## 2022-08-15 ENCOUNTER — Non-Acute Institutional Stay: Payer: Medicare Other | Admitting: Hospice

## 2022-08-15 DIAGNOSIS — L209 Atopic dermatitis, unspecified: Secondary | ICD-10-CM

## 2022-08-15 DIAGNOSIS — Z515 Encounter for palliative care: Secondary | ICD-10-CM

## 2022-08-15 NOTE — Progress Notes (Signed)
[facility] Consult Note Telephone: [phone]  Fax: [phone]  PATIENT NAME: Anne Oneill [address] [place] [address] [phone] (home)  DOB: 04-06-1927 MRN: [id number]  PRIMARY CARE PROVIDER:    Adin Hector, MD,  [address] [place] [address] [phone]  REFERRING PROVIDER:   Adin Hector, MD [facility] [place],  [place] [address] [phone]  RESPONSIBLE PARTY:  Wilhemina Cash is a Advertising account planner Information     Name Relation Home Work [place] Daughter   [phone]   paulin,theresa Daughter   [phone]      TELEHEALTH VISIT STATEMENT Due to the COVID-19 crisis, this visit was done via telemedicine from my office and it was initiated and consent by this patient and or family.  I connected with patient OR PROXY by a telephone  and verified that I am speaking with the correct person. I discussed the limitations of evaluation and management by telemedicine.  Televisit was facilitated by facility nurse/wellness and health coordinator [place] .  Palliative care team will continue to support patient, patient's family, and medical team.  ASSESSMENT AND / RECOMMENDATIONS:   CODE STATUS: Patient is a Do Not Resuscitate  Goals of Care: Goals include to maximize quality of life and symptom management. Family is open to hospice service in the future.   Symptom Management/Plan: Atopic dermatitis: Acute , rash/itchiness in left upper back, redness, bumpy, no blisters no tingling, no warmth or fever. Apply clotrimazole 1% twice daily x 2 weeks.  Monitor vital signs and rash site.  Report if not improving in a week or getting worse.    08/07/22 Dementia: Progressing memory loss/confusion, impoverished thought, limited language in line with Dementia disease trajectory. Continue Namenda, Aricept.  FAST 6D. Fall/safety precautions Depression: Continue Escitalopram. Encourage socialization and participation in facility activities.  Gait distrubance: frequent falls, three in a month, last seen in ED 07/27/22 for a fall likely due to generalized weakness. She hit her head; CT scan of the brain which does not show any acute intracranial process.  PT/OT for strengthening and gait training. Routine CBC CMP.  Follow up: Palliative care will continue to follow for complex medical decision making, advance care planning, and clarification of goals. Return 6 weeks or prn. Encouraged to call provider sooner with any concerns.   Family /Caregiver/Community Supports: Patient in AL for ongoing care  HOSPICE ELIGIBILITY/DIAGNOSIS: TBD  Chief Complaint: Follow-up visit  HISTORY OF PRESENT ILLNESS:  Nursing reports onset of rash with redness and itchiness in the left upper back.  Vital signs are within normal range, no warmth or fever no complaint of pain, tingling or discomfort.   Anne Oneill is a 89 y.o. year old female  with multiple morbidities requiring close monitoring and with high risk of complications and  mortality:  Dementia, Depression, gait disturbance/fall. History of DVT, skin cancer, hyperlipidemia, osteoporosis.   PAST MEDICAL HISTORY:  Active Ambulatory Problems    Diagnosis Date Noted   Dyspepsia 01/04/2020   Hiatal hernia 01/04/2020   History of breast cancer 01/04/2020   History of DVT (deep vein thrombosis) 01/04/2020   History of TIA (transient ischemic attack) 01/04/2020   Hyperlipidemia 01/04/2020   Meningioma ([place]) 02/11/2018   Mild cognitive impairment 12/11/2017   Mitral valve prolapse 01/04/2020   Mixed Alzheimer's and vascular dementia ([place]) 09/06/2019   Osteoporosis 01/04/2020   Hypertensive  urgency 02/23/2022   Pelvic ring fracture, sequela 02/24/2022   Sacral fracture, closed ([place]) 02/24/2022   Drop in hemoglobin 02/25/2022   Thrombocytopenia ([place])  02/25/2022   Impaired fasting glucose 02/25/2022   Suprapubic pain 02/26/2022   Resolved Ambulatory Problems    Diagnosis Date Noted   No Resolved Ambulatory Problems   Past Medical History:  Diagnosis Date   Arthritis    Cancer ([place])    Dementia ([place])    DVT (deep venous thrombosis) (HCC)    High cholesterol    Hyperlipemia    Squamous cell carcinoma of skin 02/19/2007    SOCIAL HX:  Social History   Tobacco Use   Smoking status: Never   Smokeless tobacco: Never  Substance Use Topics   Alcohol use: Not Currently     FAMILY HX: No family history on file.    ALLERGIES:  Allergies  Allergen Reactions   Codeine Nausea Only   Ditropan [Oxybutynin]    Hydrocodone Nausea Only   Morphine Nausea Only   Tramadol Nausea Only   Latex Rash      PERTINENT MEDICATIONS:  Outpatient Encounter Medications as of 08/15/2022  Medication Sig   acetaminophen (TYLENOL) 500 MG tablet Take 500 mg by mouth every 8 (eight) hours as needed for mild pain.   apixaban (ELIQUIS) 2.5 MG TABS tablet Take 2.5 mg by mouth 2 (two) times daily.   calcium-vitamin D (OSCAL WITH D) 500-5 MG-MCG tablet Take 1 tablet by mouth 2 (two) times daily.   cyanocobalamin (VITAMIN B12) 1000 MCG tablet Take 1,000 mcg by mouth daily.   donepezil (ARICEPT) 10 MG tablet Take 10 mg by mouth daily.   escitalopram (LEXAPRO) 20 MG tablet Take 20 mg by mouth daily.   feeding supplement (ENSURE ENLIVE / ENSURE PLUS) LIQD Take 237 mLs by mouth 3 (three) times daily between meals.   HYDROmorphone (DILAUDID) 2 MG tablet Take 0.5 tablets (1 mg total) by mouth every 6 (six) hours as needed for severe pain. (Patient not taking: Reported on 07/27/2022)   memantine (NAMENDA) 10 MG tablet Take 10 mg by mouth 2 (two) times daily.   mirabegron ER (MYRBETRIQ) 50 MG TB24 tablet Take 50 mg by mouth daily.   Multiple Vitamins-Minerals (PRESERVISION AREDS 2+MULTI VIT PO) Take 1 capsule by mouth 2 (two) times daily.   polyethylene glycol  (MIRALAX / GLYCOLAX) 17 g packet Take 17 g by mouth daily as needed.   simvastatin (ZOCOR) 20 MG tablet Take 20 mg by mouth at bedtime.   No facility-administered encounter medications on file as of 08/15/2022.    I spent 30 minutes providing this consultation; this includes time spent with patient/family, chart review and documentation. More than 50% of the time in this consultation was spent on counseling and coordinating communication.  Thank you for the opportunity to participate in the care of Ms. Laduca.  The palliative care team will continue to follow. Please call our office at [phone] if we can be of additional assistance.   Note: Portions of this note were generated with Lobbyist. Dictation errors may occur despite best attempts at proofreading.  Teodoro Spray, NP

## 2022-08-21 ENCOUNTER — Ambulatory Visit: Payer: Medicare Other

## 2022-08-27 ENCOUNTER — Non-Acute Institutional Stay: Payer: Medicare Other | Admitting: Hospice

## 2022-08-27 DIAGNOSIS — L209 Atopic dermatitis, unspecified: Secondary | ICD-10-CM

## 2022-08-27 DIAGNOSIS — Z515 Encounter for palliative care: Secondary | ICD-10-CM

## 2022-08-27 DIAGNOSIS — R269 Unspecified abnormalities of gait and mobility: Secondary | ICD-10-CM

## 2022-08-27 DIAGNOSIS — F339 Major depressive disorder, recurrent, unspecified: Secondary | ICD-10-CM

## 2022-08-27 DIAGNOSIS — F0390 Unspecified dementia without behavioral disturbance: Secondary | ICD-10-CM

## 2022-08-27 NOTE — Progress Notes (Signed)
[facility] Consult Note Telephone: [phone]  Fax: [phone]  PATIENT NAME: Anne Oneill [address] [place] [address] [phone] (home)  DOB: [birth year] MRN: [id number]  PRIMARY CARE PROVIDER:    Adin Hector, MD,  [address] [place] [address] [phone]  REFERRING PROVIDER:   Adin Hector, MD [facility] [place],  [place] [address] [phone]  RESPONSIBLE PARTY:  Wilhemina Cash is a Advertising account planner Information     Name Relation Home Work [place] Daughter   [phone]   paulin,theresa Daughter   [phone]      I met face to face with patient in the facility. Visit to build trust and highlight Palliative Medicine as specialized medical care for people living with serious illness, aimed at facilitating better quality of life through symptoms relief, assisting with advance care planning and complex medical decision making.  Palliative care team will continue to support patient, patient's family, and medical team.  ASSESSMENT AND / RECOMMENDATIONS:   CODE STATUS: Patient is a Do Not Resuscitate  Goals of Care: Goals include to maximize quality of life and symptom management. Family is open to hospice service in the future.  Anne Oneill had requested to call in her baby brother Clair Gulling [phone] - to bring on same page with the rest of the family during next call.    Symptom Management/Plan: Atopic dermatitis: improved, rash no longer visible.  Med Tech said rash no longer seen; educated to complete treatment duration - clotrimazole 1% twice daily x 2 weeks as earlier ordered.  Dementia:  Continue Namenda, Aricept. FAST 6D.  Provide cueing, and assistance with ADLs as needed.  Encourage socialization.  Fall/safety precautions Depression: Continue Escitalopram. Encourage socialization and  participation in facility activities.  Gait distrubance: frequent falls, last fall 08/26/2022.  Neurochecks per facility protocol.  Around frequently on patient to identify and address needs.  PT/OT for strengthening and gait training/mobility.  Follow up: Palliative care will continue to follow for complex medical decision making, advance care planning, and clarification of goals. Return 6 weeks or prn. Encouraged to call provider sooner with any concerns.   Family /Caregiver/Community Supports: Patient in AL for ongoing care  HOSPICE ELIGIBILITY/DIAGNOSIS: TBD  Chief Complaint: Follow-up visit  HISTORY OF PRESENT ILLNESS:   RETHIA KEY is a 89 y.o. year old female  with multiple morbidities requiring close monitoring and with high risk of complications and  mortality:  Dementia, Depression, gait disturbance/fall. History of DVT, skin cancer, hyperlipidemia, osteoporosis.  Patient denies itchiness to the left upper back/skin, denies pain/discomfort. History obtained from review of EMR, discussion with primary team, caregiver, family and/or Anne Oneill.  Review and summarization of Epic records shows history from other than patient. Rest of 10 point ROS asked and negative. Independent interpretation of tests and reviewed as needed, available labs, patient records, imaging, studies and related documents from the EMR.  Physical Exam: GEN: in no acute distress  Cardiac: S1 S2, no edema to BLE Respiratory:  clear to auscultation bilaterally GI: soft, nontender, nondistended, + BS MS: Moving all extremities, ambulatory with rolling walker, unsteady gait Skin: atopic dermatitis to left upper back significantly improved. Scattered old skin lesion- acitinic keratosis,  all over back and body. Thin fragile skin.  Neuro: Alert and oriented x 1, memory loss Psych: non-anxious affect, cooperative  PAST MEDICAL HISTORY:  Active Ambulatory Problems  Diagnosis Date Noted   Dyspepsia 01/04/2020    Hiatal hernia 01/04/2020   History of breast cancer 01/04/2020   History of DVT (deep vein thrombosis) 01/04/2020   History of TIA (transient ischemic attack) 01/04/2020   Hyperlipidemia 01/04/2020   Meningioma ([facility]) 02/11/2018   Mild cognitive impairment 12/11/2017   Mitral valve prolapse 01/04/2020   Mixed Alzheimer's and vascular dementia ([facility]) 09/06/2019   Osteoporosis 01/04/2020   Hypertensive urgency 02/23/2022   Pelvic ring fracture, sequela 02/24/2022   Sacral fracture, closed ([facility]) 02/24/2022   Drop in hemoglobin 02/25/2022   Thrombocytopenia ([facility]) 02/25/2022   Impaired fasting glucose 02/25/2022   Suprapubic pain 02/26/2022   Resolved Ambulatory Problems    Diagnosis Date Noted   No Resolved Ambulatory Problems   Past Medical History:  Diagnosis Date   Arthritis    Cancer ([facility])    Dementia ([facility])    DVT (deep venous thrombosis) ([facility])    High cholesterol    Hyperlipemia    Squamous cell carcinoma of skin 02/19/2007    SOCIAL HX:  Social History   Tobacco Use   Smoking status: Never   Smokeless tobacco: Never  Substance Use Topics   Alcohol use: Not Currently     FAMILY HX: No family history on file.    ALLERGIES:  Allergies  Allergen Reactions   Codeine Nausea Only   Ditropan [Oxybutynin]    Hydrocodone Nausea Only   Morphine Nausea Only   Tramadol Nausea Only   Latex Rash      PERTINENT MEDICATIONS:  Outpatient Encounter Medications as of 08/27/2022  Medication Sig   acetaminophen (TYLENOL) 500 MG tablet Take 500 mg by mouth every 8 (eight) hours as needed for mild pain.   apixaban (ELIQUIS) 2.5 MG TABS tablet Take 2.5 mg by mouth 2 (two) times daily.   calcium-vitamin D (OSCAL WITH D) 500-5 MG-MCG tablet Take 1 tablet by mouth 2 (two) times daily.   cyanocobalamin (VITAMIN B12) 1000 MCG tablet Take 1,000 mcg by mouth daily.   donepezil (ARICEPT) 10 MG tablet Take 10 mg by mouth daily.   escitalopram (LEXAPRO) 20 MG tablet Take 20 mg by mouth  daily.   feeding supplement (ENSURE ENLIVE / ENSURE PLUS) LIQD Take 237 mLs by mouth 3 (three) times daily between meals.   HYDROmorphone (DILAUDID) 2 MG tablet Take 0.5 tablets (1 mg total) by mouth every 6 (six) hours as needed for severe pain. (Patient not taking: Reported on 07/27/2022)   memantine (NAMENDA) 10 MG tablet Take 10 mg by mouth 2 (two) times daily.   mirabegron ER (MYRBETRIQ) 50 MG TB24 tablet Take 50 mg by mouth daily.   Multiple Vitamins-Minerals (PRESERVISION AREDS 2+MULTI VIT PO) Take 1 capsule by mouth 2 (two) times daily.   polyethylene glycol (MIRALAX / GLYCOLAX) 17 g packet Take 17 g by mouth daily as needed.   simvastatin (ZOCOR) 20 MG tablet Take 20 mg by mouth at bedtime.   No facility-administered encounter medications on file as of 08/27/2022.    I spent 40 minutes providing this consultation; this includes time spent with patient/family, chart review and documentation. More than 50% of the time in this consultation was spent on counseling and coordinating communication.  Thank you for the opportunity to participate in the care of Anne Oneill.  The palliative care team will continue to follow. Please call our office at [phone] if we can be of additional assistance.   Note: Portions of this note were generated with Dragon dictation  software. Dictation errors may occur despite best attempts at proofreading.  Teodoro Spray, NP

## 2022-09-12 ENCOUNTER — Telehealth: Payer: Self-pay | Admitting: Hospice

## 2022-09-12 NOTE — Telephone Encounter (Signed)
NP called [facility] as requested and spoke with [facility] who reported patient with pain in her left leg. Advised to give a dose of her pain medication on file Tylenol 500 mg every 8 hours as needed for pain. Message on medication left with Tiffany. Beth said PCP will visit tomorrow and will see patient.

## 2022-09-18 ENCOUNTER — Non-Acute Institutional Stay: Payer: Medicare Other | Admitting: Hospice

## 2022-09-18 DIAGNOSIS — R269 Unspecified abnormalities of gait and mobility: Secondary | ICD-10-CM

## 2022-09-18 DIAGNOSIS — Z515 Encounter for palliative care: Secondary | ICD-10-CM

## 2022-09-18 DIAGNOSIS — F339 Major depressive disorder, recurrent, unspecified: Secondary | ICD-10-CM

## 2022-09-18 DIAGNOSIS — R52 Pain, unspecified: Secondary | ICD-10-CM

## 2022-09-18 DIAGNOSIS — F0390 Unspecified dementia without behavioral disturbance: Secondary | ICD-10-CM

## 2022-09-18 NOTE — Progress Notes (Signed)
Therapist, nutritional Palliative Care Consult Note Telephone: [phone]  Fax: [phone]  PATIENT NAME: Anne Oneill [name] [place] [address] [phone] (home)  DOB: 17-Jan-1927 MRN: [id number]  PRIMARY CARE PROVIDER:    Lynnea Ferrier, MD,  [address] [facility] [place] [address] [phone]   REFERRING PROVIDER:   Lynnea Ferrier, MD [address] [facility] [place],  [place] [address] [phone]   RESPONSIBLE PARTY:  Antony Madura is a Control and instrumentation engineer Information     Name Relation Home Work Mobile   Berlin Hun Daughter   [phone]   paulin,theresa Daughter   [phone]      I met face to face with patient in the facility. Visit to build trust and highlight Palliative Medicine as specialized medical care for people living with serious illness, aimed at facilitating better quality of life through symptoms relief, assisting with advance care planning and complex medical decision making.  NP called Anne Oneill and updated her on visit.  She expressed appreciation for the call. Palliative care team will continue to support patient, patient's family, and medical team.  ASSESSMENT AND / RECOMMENDATIONS:   CODE STATUS: Patient is a Do Not Resuscitate  Goals of Care: Goals include to maximize quality of life and symptom management. Family is open to hospice service in the future.  Anne Oneill had requested to call in her baby brother Anne Oneill [phone] - to bring on same page with the rest of the family during next call.    Symptom Management/Plan: Dementia: Memory loss and confusion at baseline.  Continue Namenda, Aricept. FAST 6D.  Provide cueing, and assistance with ADLs as needed. Monitor for changes in behavior, changes in functional status.  Fall/safety precautions.  Depression: Continue Escitalopram. Encourage socialization and participation in facility  activities.   Pain: likely related to resolving hematoma to left lateral lower leg Seen by PCP Traci NP 09/13/22 per [facility]. No change to plan of care. Continue Tylenol 500 mg TID prn pain. Nursing to monitor her use of wheelchair to avoid bumping her leg to it.  Gait distrubance: frequent falls, last fall 08/26/2022.  PT/OT for strengthening and gait training/mobility. Nurse Amber to inform therapy to teach not bumping her leg to her assistive device.   Follow up: Palliative care will continue to follow for complex medical decision making, advance care planning, and clarification of goals. Return 6 weeks or prn. Encouraged to call provider sooner with any concerns.   Family /Caregiver/Community Supports: Patient in AL for ongoing care  HOSPICE ELIGIBILITY/DIAGNOSIS: TBD  Chief Complaint: Follow-up visit  HISTORY OF PRESENT ILLNESS:   Anne Oneill is a 89 y.o. year old female  with multiple morbidities requiring close monitoring and with high risk of complications and  mortality:  Dementia, Depression, gait disturbance/fall. History of DVT, skin cancer, hyperlipidemia, osteoporosis. History of recurrent UTI for which she is on Macrobid for prophylaxis. She denies pain/discomfort, reports occasional pain in her left lateral lower leg which she said has improved. History obtained from review of EMR, discussion with primary team, caregiver, family and/or Ms. Liszewski.  Review and summarization of Epic records shows history from other than patient. Rest of 10 point ROS asked and negative. Independent interpretation of tests and reviewed as needed, available labs, patient records, imaging, studies and related documents from the EMR.  Physical Exam: GEN: in no acute distress  Cardiac: S1 S2, no edema to BLE Respiratory:  clear to auscultation bilaterally GI: soft, nontender, nondistended, + BS MS: Moving all extremities, ambulatory with rolling walker, unsteady gait Skin: Resolving hematoma to left  lateral lower leg. Thin fragile skin.  Neuro: Alert and oriented x 1, memory loss Psych: non-anxious affect, cooperative  PAST MEDICAL HISTORY:  Active Ambulatory Problems    Diagnosis Date Noted   Dyspepsia 01/04/2020   Hiatal hernia 01/04/2020   History of breast cancer 01/04/2020   History of DVT (deep vein thrombosis) 01/04/2020   History of TIA (transient ischemic attack) 01/04/2020   Hyperlipidemia 01/04/2020   Meningioma 02/11/2018   Mild cognitive impairment 12/11/2017   Mitral valve prolapse 01/04/2020   Mixed Alzheimer's and vascular dementia 09/06/2019   Osteoporosis 01/04/2020   Hypertensive urgency 02/23/2022   Pelvic ring fracture, sequela 02/24/2022   Sacral fracture, closed 02/24/2022   Drop in hemoglobin 02/25/2022   Thrombocytopenia 02/25/2022   Impaired fasting glucose 02/25/2022   Suprapubic pain 02/26/2022   Resolved Ambulatory Problems    Diagnosis Date Noted   No Resolved Ambulatory Problems   Past Medical History:  Diagnosis Date   Arthritis    Cancer (HCC)    Dementia (HCC)    DVT (deep venous thrombosis) (HCC)    High cholesterol    Hyperlipemia    Squamous cell carcinoma of skin 02/19/2007    SOCIAL HX:  Social History   Tobacco Use   Smoking status: Never   Smokeless tobacco: Never  Substance Use Topics   Alcohol use: Not Currently     FAMILY HX: No family history on file.    ALLERGIES:  Allergies  Allergen Reactions   Codeine Nausea Only   Ditropan [Oxybutynin]    Hydrocodone Nausea Only   Morphine Nausea Only   Tramadol Nausea Only   Latex Rash      PERTINENT MEDICATIONS:  Outpatient Encounter Medications as of 09/18/2022  Medication Sig   acetaminophen (TYLENOL) 500 MG tablet Take 500 mg by mouth every 8 (eight) hours as needed for mild pain.   apixaban (ELIQUIS) 2.5 MG TABS tablet Take 2.5 mg by mouth 2 (two) times daily.   calcium-vitamin D (OSCAL WITH D) 500-5 MG-MCG tablet Take 1 tablet by mouth 2 (two) times daily.    cyanocobalamin (VITAMIN B12) 1000 MCG tablet Take 1,000 mcg by mouth daily.   donepezil (ARICEPT) 10 MG tablet Take 10 mg by mouth daily.   escitalopram (LEXAPRO) 20 MG tablet Take 20 mg by mouth daily.   feeding supplement (ENSURE ENLIVE / ENSURE PLUS) LIQD Take 237 mLs by mouth 3 (three) times daily between meals.   HYDROmorphone (DILAUDID) 2 MG tablet Take 0.5 tablets (1 mg total) by mouth every 6 (six) hours as needed for severe pain. (Patient not taking: Reported on 07/27/2022)   memantine (NAMENDA) 10 MG tablet Take 10 mg by mouth 2 (two) times daily.   mirabegron ER (MYRBETRIQ) 50 MG TB24 tablet Take 50 mg by mouth daily.   Multiple Vitamins-Minerals (PRESERVISION AREDS 2+MULTI VIT PO) Take 1 capsule by mouth 2 (two) times daily.   polyethylene glycol (MIRALAX / GLYCOLAX) 17 g packet Take 17 g by mouth daily as needed.   simvastatin (ZOCOR) 20 MG tablet Take 20 mg by mouth at bedtime.   No facility-administered encounter medications on file as of 09/18/2022.    I spent 60 minutes providing this consultation; this includes time spent with patient/family, chart review and documentation. More than 50% of the time in this consultation was spent on counseling  and coordinating communication.  Thank you for the opportunity to participate in the care of Ms. Meline.  The palliative care team will continue to follow. Please call our office at [phone] if we can be of additional assistance.   Note: Portions of this note were generated with Scientist, clinical (histocompatibility and immunogenetics)Dragon dictation software. Dictation errors may occur despite best attempts at proofreading.  Rosaura CarpenterLivina I Azriel Dancy, NP

## 2022-11-08 ENCOUNTER — Non-Acute Institutional Stay: Payer: Medicare Other | Admitting: Hospice

## 2022-11-08 DIAGNOSIS — F0390 Unspecified dementia without behavioral disturbance: Secondary | ICD-10-CM

## 2022-11-08 DIAGNOSIS — Z515 Encounter for palliative care: Secondary | ICD-10-CM

## 2022-11-08 DIAGNOSIS — F339 Major depressive disorder, recurrent, unspecified: Secondary | ICD-10-CM

## 2022-11-08 DIAGNOSIS — R269 Unspecified abnormalities of gait and mobility: Secondary | ICD-10-CM

## 2022-11-08 NOTE — Progress Notes (Signed)
Therapist, nutritional Palliative Care Consult Note Telephone: [phone]  Fax: [phone]  PATIENT NAME: Anne Oneill [name] [place] [address] [phone] (home)  DOB: Jan 18, 1927 MRN: [id number]  PRIMARY CARE PROVIDER:    Lynnea Ferrier, MD,  [address] [facility] [place] [address] [phone]   REFERRING PROVIDER:   Lynnea Ferrier, MD [address] [facility] [place],  [place] [address] [phone]   RESPONSIBLE PARTY:  Antony Madura is a Registered nurse  Rosanne Ashing - son - [phone]  Contact Information     Name Relation Home Work Mobile   Berlin Hun Daughter   [phone]   paulin,theresa Daughter   [phone]      I met face to face with patient in the facility. Visit to build trust and highlight Palliative Medicine as specialized medical care for people living with serious illness, aimed at facilitating better quality of life through symptoms relief, assisting with advance care planning and complex medical decision making.  NP called Hilda Lias and left her a voicemail with callback number. Palliative care team will continue to support patient, patient's family, and medical team.  ASSESSMENT AND / RECOMMENDATIONS:   CODE STATUS: Patient is a Do Not Resuscitate  Goals of Care: Goals include to maximize quality of life and symptom management. Family is open to hospice service in the future.   Symptom Management/Plan: Dementia: Monitor for cognitive/functional decline and changes in behavior.  Memory loss and confusion at baseline.  Continue Namenda, Aricept. FAST 6D.  Provide cueing, and assistance with ADLs as needed.  Fall/safety precautions.  Depression: Continue Escitalopram. Encourage socialization and participation in facility activities.   Gait distrubance: Recently completed physical therapy/Occupational Therapy.  Use assistive device-rolling walker to help  prevent fall.  Maintain fall precautions at all times.  Follow up: Palliative care will continue to follow for complex medical decision making, advance care planning, and clarification of goals. Return 6 weeks or prn. Encouraged to call provider sooner with any concerns.   Family /Caregiver/Community Supports: Patient in AL for ongoing care  HOSPICE ELIGIBILITY/DIAGNOSIS: TBD  Chief Complaint: Follow-up visit  HISTORY OF PRESENT ILLNESS:   Anne Oneill is a 89 y.o. year old female  with multiple morbidities requiring close monitoring and with high risk of complications and  mortality:  Dementia, Depression, gait disturbance/fall. History of DVT, breast cancer, hyperlipidemia, osteoporosis. History of recurrent UTI for which she is on Macrobid for prophylaxis. She denies pain/discomfort History obtained from review of EMR, discussion with primary team, caregiver, family and/or [name].  Review and summarization of Epic records shows history from other than patient. Rest of 10 point ROS asked and negative. Independent interpretation of tests and reviewed as needed, available labs, patient records, imaging, studies and related documents from the EMR.  Physical Exam: GEN: in no acute distress  Cardiac: S1 S2, no edema to BLE Respiratory:  clear to auscultation bilaterally GI: soft, nontender, nondistended, + BS MS: Moving all extremities, ambulatory with rolling walker, unsteady gait Neuro: Alert and oriented x 1, memory loss Psych: non-anxious affect, cooperative  PAST MEDICAL HISTORY:  Active Ambulatory Problems    Diagnosis Date Noted   Dyspepsia 01/04/2020   Hiatal hernia 01/04/2020   History of breast cancer 01/04/2020   History of DVT (deep vein thrombosis) 01/04/2020   History of TIA (transient ischemic attack) 01/04/2020   Hyperlipidemia 01/04/2020   Meningioma 02/11/2018   Mild cognitive impairment  12/11/2017   Mitral valve prolapse 01/04/2020   Mixed Alzheimer's and  vascular dementia 09/06/2019   Osteoporosis 01/04/2020   Hypertensive urgency 02/23/2022   Pelvic ring fracture, sequela 02/24/2022   Sacral fracture, closed 02/24/2022   Drop in hemoglobin 02/25/2022   Thrombocytopenia 02/25/2022   Impaired fasting glucose 02/25/2022   Suprapubic pain 02/26/2022   Resolved Ambulatory Problems    Diagnosis Date Noted   No Resolved Ambulatory Problems   Past Medical History:  Diagnosis Date   Arthritis    Cancer ([facility]CC)    Dementia (H[facility])    DVT (deep venous thrombosis) (HCC)    High cholesterol    Hyperlipemia    Squamous cell carcinoma of skin 02/19/2007    SOCIAL HX:  Social History   Tobacco Use   Smoking status: Never   Smokeless tobacco: Never  Substance Use Topics   Alcohol use: Not Currently     FAMILY HX: No family history on file.    ALLERGIES:  Allergies  Allergen Reactions   Codeine Nausea Only   Ditropan [Oxybutynin]    Hydrocodone Nausea Only   Morphine Nausea Only   Tramadol Nausea Only   Latex Rash      PERTINENT MEDICATIONS:  Outpatient Encounter Medications as of 09/18/2022  Medication Sig   acetaminophen (TYLENOL) 500 MG tablet Take 500 mg by mouth every 8 (eight) hours as needed for mild pain.   apixaban (ELIQUIS) 2.5 MG TABS tablet Take 2.5 mg by mouth 2 (two) times daily.   calcium-vitamin D (OSCAL WITH D) 500-5 MG-MCG tablet Take 1 tablet by mouth 2 (two) times daily.   cyanocobalamin (VITAMIN B12) 1000 MCG tablet Take 1,000 mcg by mouth daily.   donepezil (ARICEPT) 10 MG tablet Take 10 mg by mouth daily.   escitalopram (LEXAPRO) 20 MG tablet Take 20 mg by mouth daily.   feeding supplement (ENSURE ENLIVE / ENSURE PLUS) LIQD Take 237 mLs by mouth 3 (three) times daily between meals.   HYDROmorphone (DILAUDID) 2 MG tablet Take 0.5 tablets (1 mg total) by mouth every 6 (six) hours as needed for severe pain. (Patient not taking: Reported on 07/27/2022)   memantine (NAMENDA) 10 MG tablet Take 10 mg by mouth 2  (two) times daily.   mirabegron ER (MYRBETRIQ) 50 MG TB24 tablet Take 50 mg by mouth daily.   Multiple Vitamins-Minerals (PRESERVISION AREDS 2+MULTI VIT PO) Take 1 capsule by mouth 2 (two) times daily.   polyethylene glycol (MIRALAX / GLYCOLAX) 17 g packet Take 17 g by mouth daily as needed.   simvastatin (ZOCOR) 20 MG tablet Take 20 mg by mouth at bedtime.   No facility-administered encounter medications on file as of 09/18/2022.    I spent 60 minutes providing this consultation; this includes time spent with patient/family, chart review and documentation. More than 50% of the time in this consultation was spent on counseling and coordinating communication.  Thank you for the opportunity to participate in the care of Ms. Scaduto.  The palliative care team will continue to follow. Please call our office at [phone] if we can be of additional assistance.   Note: Portions of this note were generated with Scientist, clinical (histocompatibility and immunogenetics). Dictation errors may occur despite best attempts at proofreading.  Rosaura Carpenter, NP

## 2022-11-20 ENCOUNTER — Non-Acute Institutional Stay: Payer: Medicare Other | Admitting: Hospice

## 2022-11-20 DIAGNOSIS — R269 Unspecified abnormalities of gait and mobility: Secondary | ICD-10-CM

## 2022-11-20 DIAGNOSIS — F0390 Unspecified dementia without behavioral disturbance: Secondary | ICD-10-CM

## 2022-11-20 DIAGNOSIS — F339 Major depressive disorder, recurrent, unspecified: Secondary | ICD-10-CM

## 2022-11-20 DIAGNOSIS — Z515 Encounter for palliative care: Secondary | ICD-10-CM

## 2022-11-20 NOTE — Progress Notes (Signed)
Therapist, nutritional Palliative Care Consult Note Telephone: [phone]  Fax: [phone]  PATIENT NAME: Anne Oneill [name] [place] [address] [phone] (home)  DOB: 23-Nov-1926 MRN: [id number]  PRIMARY CARE PROVIDER:    Lynnea Ferrier, MD,  [address] [facility] [place] [address] [phone]   REFERRING PROVIDER:   Lynnea Ferrier, MD [address] [facility] [place],  [place] [address] [phone]   RESPONSIBLE PARTY:  Antony Madura is a Registered nurse  Rosanne Ashing - son - [phone]  Contact Information     Name Relation Home Work Mobile   Berlin Hun Daughter   [phone]   paulin,theresa Daughter   [phone]      I met face to face with patient in the facility. Visit to build trust and highlight Palliative Medicine as specialized medical care for people living with serious illness, aimed at facilitating better quality of life through symptoms relief, assisting with advance care planning and complex medical decision making.    ASSESSMENT AND / RECOMMENDATIONS:   CODE STATUS: Patient is a Do Not Resuscitate  Goals of Care: Goals include to maximize quality of life and symptom management. Family is open to hospice service in the future.   Symptom Management/Plan: Dementia: Increasing memory loss, confusion, wandering. UA negative. Maintain Fall and safety precautions. Continue to monitor for cognitive/functional decline and changes in behavior.   Continue Namenda, Aricept. FAST 6D.  Provide cueing, and assistance with ADLs as needed.    Depression: Continue Escitalopram. Encourage socialization and participation in facility activities.   Gait distrubance: Worsening gait. High fall risk. Physical therapy/Occupational Therapy is ongoing for gait training and mobility. .  Use assistive device-rolling walker to help prevent fall.  Maintain fall precautions at all  times.  Follow up: Palliative care will continue to follow for complex medical decision making, advance care planning, and clarification of goals. Return 6 weeks or prn. Encouraged to call provider sooner with any concerns.   Family /Caregiver/Community Supports: Patient in [place] for ongoing care  HOSPICE ELIGIBILITY/DIAGNOSIS: TBD  Chief Complaint: Follow-up visit  HISTORY OF PRESENT ILLNESS:   Anne Oneill is a 89 y.o. year old female  with multiple morbidities requiring close monitoring and with high risk of complications and  mortality:  Dementia, Depression, gait disturbance/fall. History of DVT, breast cancer, hyperlipidemia, osteoporosis. History of recurrent UTI for which she is on Macrobid for prophylaxis. She denies pain/discomfort, denies moodiness.  FLACC 0.  History obtained from review of EMR, discussion with primary team, caregiver, family and/or [name].  Review and summarization of Epic records shows history from other than patient. Rest of 10 point ROS asked and negative. Independent interpretation of tests and reviewed as needed, available labs, patient records, imaging, studies and related documents from the EMR.  Physical Exam: GEN: in no acute distress  Cardiac: S1 S2, no edema to BLE Respiratory:  clear to auscultation bilaterally GI: soft, nontender, nondistended, + BS MS: Moving all extremities, ambulatory with rolling walker, unsteady gait Neuro: Alert and oriented x 1, memory loss/confusion Psych: non-anxious affect, cooperative  PAST MEDICAL HISTORY:  Active Ambulatory Problems    Diagnosis Date Noted   Dyspepsia 01/04/2020   Hiatal hernia 01/04/2020   History of breast cancer 01/04/2020   History of DVT (deep vein thrombosis) 01/04/2020   History of TIA (transient ischemic attack) 01/04/2020   Hyperlipidemia 01/04/2020   Meningioma (HCC) 02/11/2018   Mild  cognitive impairment 12/11/2017   Mitral valve prolapse 01/04/2020   Mixed Alzheimer's and  vascular dementia (HCC) 09/06/2019   Osteoporosis 01/04/2020   Hypertensive urgency 02/23/2022   Pelvic ring fracture, sequela 02/24/2022   Sacral fracture, closed (HCC) 02/24/2022   Drop in hemoglobin 02/25/2022   Thrombocytopenia (HCC) 02/25/2022   Impaired fasting glucose 02/25/2022   Suprapubic pain 02/26/2022   Resolved Ambulatory Problems    Diagnosis Date Noted   No Resolved Ambulatory Problems   Past Medical History:  Diagnosis Date   Arthritis    Cancer (HCC)    Dementia (HCC)    DVT (deep venous thrombosis) (HCC)    High cholesterol    Hyperlipemia    Squamous cell carcinoma of skin 02/19/2007    SOCIAL HX:  Social History   Tobacco Use   Smoking status: Never   Smokeless tobacco: Never  Substance Use Topics   Alcohol use: Not Currently     FAMILY HX: No family history on file.    ALLERGIES:  Allergies  Allergen Reactions   Codeine Nausea Only   Ditropan [Oxybutynin]    Hydrocodone Nausea Only   Morphine Nausea Only   Tramadol Nausea Only   Latex Rash      PERTINENT MEDICATIONS:  Outpatient Encounter Medications as of 11/20/2022  Medication Sig   acetaminophen (TYLENOL) 500 MG tablet Take 500 mg by mouth every 8 (eight) hours as needed for mild pain.   apixaban (ELIQUIS) 2.5 MG TABS tablet Take 2.5 mg by mouth 2 (two) times daily.   calcium-vitamin D (OSCAL WITH D) 500-5 MG-MCG tablet Take 1 tablet by mouth 2 (two) times daily.   cyanocobalamin (VITAMIN B12) 1000 MCG tablet Take 1,000 mcg by mouth daily.   donepezil (ARICEPT) 10 MG tablet Take 10 mg by mouth daily.   escitalopram (LEXAPRO) 20 MG tablet Take 20 mg by mouth daily.   feeding supplement (ENSURE ENLIVE / ENSURE PLUS) LIQD Take 237 mLs by mouth 3 (three) times daily between meals.   HYDROmorphone (DILAUDID) 2 MG tablet Take 0.5 tablets (1 mg total) by mouth every 6 (six) hours as needed for severe pain. (Patient not taking: Reported on 07/27/2022)   memantine (NAMENDA) 10 MG tablet Take 10  mg by mouth 2 (two) times daily.   mirabegron ER (MYRBETRIQ) 50 MG TB24 tablet Take 50 mg by mouth daily.   Multiple Vitamins-Minerals (PRESERVISION AREDS 2+MULTI VIT PO) Take 1 capsule by mouth 2 (two) times daily.   polyethylene glycol (MIRALAX / GLYCOLAX) 17 g packet Take 17 g by mouth daily as needed.   simvastatin (ZOCOR) 20 MG tablet Take 20 mg by mouth at bedtime.   No facility-administered encounter medications on file as of 11/20/2022.    I spent 40 minutes providing this consultation; this includes time spent with patient/family, chart review and documentation. More than 50% of the time in this consultation was spent on counseling and coordinating communication.  Thank you for the opportunity to participate in the care of Ms. Medaglia.  The palliative care team will continue to follow. Please call our office at [phone] if we can be of additional assistance.   Note: Portions of this note were generated with Scientist, clinical (histocompatibility and immunogenetics). Dictation errors may occur despite best attempts at proofreading.  Rosaura Carpenter, NP

## 2024-01-07 ENCOUNTER — Emergency Department

## 2024-01-07 ENCOUNTER — Emergency Department
Admission: EM | Admit: 2024-01-07 | Discharge: 2024-01-07 | Disposition: A | Discharge status: Home / Self Care | Source: Skilled Nursing Facility

## 2024-01-07 DIAGNOSIS — I10 Essential (primary) hypertension: Secondary | ICD-10-CM | POA: Diagnosis not present

## 2024-01-07 DIAGNOSIS — R519 Headache, unspecified: Secondary | ICD-10-CM | POA: Insufficient documentation

## 2024-01-07 DIAGNOSIS — S14109A Unspecified injury at unspecified level of cervical spinal cord, initial encounter: Secondary | ICD-10-CM

## 2024-01-07 DIAGNOSIS — Z7901 Long term (current) use of anticoagulants: Secondary | ICD-10-CM | POA: Insufficient documentation

## 2024-01-07 DIAGNOSIS — Z86718 Personal history of other venous thrombosis and embolism: Secondary | ICD-10-CM | POA: Diagnosis not present

## 2024-01-07 DIAGNOSIS — S199XXA Unspecified injury of neck, initial encounter: Secondary | ICD-10-CM | POA: Diagnosis not present

## 2024-01-07 DIAGNOSIS — S022XXA Fracture of nasal bones, initial encounter for closed fracture: Secondary | ICD-10-CM | POA: Insufficient documentation

## 2024-01-07 DIAGNOSIS — W19XXXA Unspecified fall, initial encounter: Secondary | ICD-10-CM | POA: Diagnosis not present

## 2024-01-07 DIAGNOSIS — G309 Alzheimer's disease, unspecified: Secondary | ICD-10-CM | POA: Diagnosis not present

## 2024-01-07 DIAGNOSIS — Y92129 Unspecified place in nursing home as the place of occurrence of the external cause: Secondary | ICD-10-CM | POA: Diagnosis not present

## 2024-01-07 DIAGNOSIS — F0280 Dementia in other diseases classified elsewhere without behavioral disturbance: Secondary | ICD-10-CM | POA: Diagnosis not present

## 2024-01-07 DIAGNOSIS — S0992XA Unspecified injury of nose, initial encounter: Secondary | ICD-10-CM | POA: Diagnosis present

## 2024-01-07 DIAGNOSIS — S0081XA Abrasion of other part of head, initial encounter: Secondary | ICD-10-CM | POA: Insufficient documentation

## 2024-01-07 LAB — URINALYSIS, ROUTINE W REFLEX MICROSCOPIC
Bacteria, UA: NONE SEEN
Bilirubin Urine: NEGATIVE
Glucose, UA: NEGATIVE mg/dL
Hgb urine dipstick: NEGATIVE
Ketones, ur: NEGATIVE mg/dL
Leukocytes,Ua: NEGATIVE
Nitrite: NEGATIVE
Protein, ur: NEGATIVE mg/dL
Specific Gravity, Urine: 1.006 (ref 1.005–1.030)
Squamous Epithelial / HPF: 0 /HPF (ref 0–5)
pH: 7 (ref 5.0–8.0)

## 2024-01-07 LAB — CBC WITH DIFFERENTIAL/PLATELET
Abs Immature Granulocytes: 0.02 K/uL (ref 0.00–0.07)
Basophils Absolute: 0 K/uL (ref 0.0–0.1)
Basophils Relative: 0 %
Eosinophils Absolute: 0.1 K/uL (ref 0.0–0.5)
Eosinophils Relative: 1 %
HCT: 40.3 % (ref 36.0–46.0)
Hemoglobin: 13.2 g/dL (ref 12.0–15.0)
Immature Granulocytes: 0 %
Lymphocytes Relative: 14 %
Lymphs Abs: 0.9 K/uL (ref 0.7–4.0)
MCH: 30.2 pg (ref 26.0–34.0)
MCHC: 32.8 g/dL (ref 30.0–36.0)
MCV: 92.2 fL (ref 80.0–100.0)
Monocytes Absolute: 0.5 K/uL (ref 0.1–1.0)
Monocytes Relative: 8 %
Neutro Abs: 5.2 K/uL (ref 1.7–7.7)
Neutrophils Relative %: 77 %
Platelets: 188 K/uL (ref 150–400)
RBC: 4.37 MIL/uL (ref 3.87–5.11)
RDW: 12.2 % (ref 11.5–15.5)
WBC: 6.7 K/uL (ref 4.0–10.5)
nRBC: 0 % (ref 0.0–0.2)

## 2024-01-07 LAB — BASIC METABOLIC PANEL WITH GFR
Anion gap: 11 (ref 5–15)
BUN: 27 mg/dL — ABNORMAL HIGH (ref 8–23)
CO2: 23 mmol/L (ref 22–32)
Calcium: 9.5 mg/dL (ref 8.9–10.3)
Chloride: 104 mmol/L (ref 98–111)
Creatinine, Ser: 0.86 mg/dL (ref 0.44–1.00)
GFR, Estimated: >60 mL/min (ref >60)
Glucose, Bld: 116 mg/dL — ABNORMAL HIGH (ref 70–99)
Potassium: 4.4 mmol/L (ref 3.5–5.1)
Sodium: 138 mmol/L (ref 135–145)

## 2024-01-07 MED ORDER — ACETAMINOPHEN 325 MG PO TABS
650.0000 mg | ORAL_TABLET | Freq: Once | ORAL | Status: AC
  Administered 2024-01-07: 650 mg via ORAL
  Filled 2024-01-07: qty 2

## 2024-01-07 MED ORDER — HYDRALAZINE HCL 20 MG/ML IJ SOLN
10.0000 mg | Freq: Once | INTRAMUSCULAR | Status: AC
  Administered 2024-01-07: 10 mg via INTRAVENOUS
  Filled 2024-01-07: qty 1

## 2024-01-07 NOTE — ED Provider Notes (Addendum)
 [facility] Provider Note    Event Date/Time   First MD Initiated Contact with Patient 01/07/24 1021     (approximate)   History   Fall   HPI  Anne Oneill is a 89 y.o. female with a history of TIA and DVT on Eliquis , Alzheimer's dementia, hypertension, thrombocytopenia, who resides at a skilled nursing facility who presents with a fall.  History is initially obtained by EMS as patient is incapable of providing.  Patient resides at [facility] and had an unwitnessed fall.  It is unknown whether she had loss of consciousness.  Patient endorses head pain.  She arrives via EMS and was hemodynamically stable en route.  Patient denies any pain in her pelvis, any pain in her abdomen or chest.      Physical Exam   Triage Vital Signs: ED Triage Vitals [01/07/24 1021]  Encounter Vitals Group     BP (!) 216/80     Girls Systolic BP Percentile      Girls Diastolic BP Percentile      Boys Systolic BP Percentile      Boys Diastolic BP Percentile      Pulse Rate 66     Resp (!) 22     Temp 98.3 F (36.8 C)     Temp Source Oral     SpO2 100 %     Weight      Height      Head Circumference      Peak Flow      Pain Score      Pain Loc      Pain Education      Exclude from Growth Chart     Most recent vital signs: Vitals:   01/07/24 1531 01/07/24 1630  BP:  (!) 189/60  Pulse:  (!) 57  Resp:  16  Temp: (!) 97.4 F (36.3 C)   SpO2:  100%    Nursing Triage Note reviewed. Vital signs reviewed and patients oxygen saturation is normoxic  General: Patient is well nourished, well developed, awake and alert,  Head: Normocephalic, there were no scalp lacerations or abrasions Eyes: Normal inspection, extraocular muscles intact, no conjunctival pallor Ear, nose, throat: Normal external exam There were skin abrasions on the patient's forehead malar eminence and nose Neck: Normal range of motion, she does have C-spine tenderness to palpation.   She arrives without a c-collar.  A c-collar was placed Respiratory: Patient is in no respiratory distress, lungs CTAB No rib tenderness to palpation Cardiovascular: Patient is not tachycardic, RRR without murmur appreciated GI: Abd SNT with no guarding or rebound  Back: Normal inspection of the back with good strength and range of motion throughout all ext Extremities: pulses intact with good cap refills, no LE pitting edema or calf tenderness Neuro: The patient is alert and oriented to person, not to place and time and easily confused 5/5 bilat UE/LE strength, no gross motor or sensory defects noted. Coordination appears to be adequate. Skin: Warm, dry, and intact Psych: normal mood and affect, no SI or HI  ED Results / Procedures / Treatments   Labs (all labs ordered are listed, but only abnormal results are displayed) Labs Reviewed  BASIC METABOLIC PANEL WITH GFR - Abnormal; Notable for the following components:      Result Value   Glucose, Bld 116 (*)    BUN 27 (*)    All other components within normal limits  URINALYSIS, ROUTINE W REFLEX MICROSCOPIC -  Abnormal; Notable for the following components:   Color, Urine STRAW (*)    APPearance CLEAR (*)    All other components within normal limits  CBC WITH DIFFERENTIAL/PLATELET     EKG EKG and rhythm strip are interpreted by myself:   EKG: [Normal sinus rhythm] at heart rate of 64, normal QRS duration, QTc 468, nonspecific ST segments and T waves no ectopy EKG not consistent with Acute STEMI Rhythm strip: NSR in lead II   RADIOLOGY CT head: No intracranial hemorrhage on my independent review interpretation and radiologist agrees CT C-spine: Concern for C-spine fracture CT face: Nasal bone fractures X-ray of pelvis: Unremarkable X-ray right hand: Unremarkable MRI C-spine: C1 ligamentous injury MRI T-spine: Chronic compression fracture   PROCEDURES:  Critical Care performed: No  Procedures   MEDICATIONS ORDERED IN  ED: Medications  acetaminophen  (TYLENOL ) tablet 650 mg (650 mg Oral Given 01/07/24 1101)     IMPRESSION / MDM / ASSESSMENT AND PLAN / ED COURSE                                Differential diagnosis includes, but is not limited to intracranial hemorrhage, facial fracture, cervical spine fracture, fracture of the pelvis or hip, finger fracture, electrolyte derangement, acute renal insufficiency, UTI  11:39 AM on 01/07/2024 ED course: Patient presents acutely after a fall with does have evidence of head trauma and is on Eliquis .  I do not appreciate any focal neurological deficits on arrival.  Patient's blood pressure is profoundly elevated but CT head on my independent review interpretation demonstrates no intracranial hemorrhage we are awaiting radiologist read.  We will repeat this and determine whether patient has taken her blood pressure medications or whether this is secondary to pain or agitation.  Imaging is pending.  Given daughter's concerns we will obtain blood work and a straight cath urine.  Disposition is pending diagnostics   Clinical Course as of 01/07/24 1653  Tue Jan 07, 2024  1127 Patient's daughter is at bedside who is an Charity fundraiser at our facility.  Patient's daughter was concerned about the patient's right hand and facial swelling.  She also states that the skilled nursing facility was concerned about the possibility of a UTI as patient has been slightly more confused over the past couple of days.  Patient's daughter was amenable to blood work and a In-N-Out cath for urinalysis which has been ordered [HD]  1243 DG Hand Complete Right No acute fracture [HD]  1243 Abnormal lucency through the left anterior aspect of the C1 anterior arch which is new since 2024 concerning for nondisplaced fracture. Recommend MRI cervical spine to evaluate for ligamentous injury.  Additional compression deformity of T3 with mild height loss which is also likely new since 2024. Recommend correlation  with MRI thoracic spine.  Displaced and angulated left nasal bone fracture with narrowing of the nasal vestibule. Additional mildly displaced right nasal bone fracture.  Chronic and degenerative changes as above.   [HD]  1244 Updated patient's daughter regarding the results.  Patient does not have any metal in her body and agreeable to MRI of the C and T-spine [HD]  1427 MR Cervical Spine Wo Contrast [HD]  1427 MR THORACIC SPINE WO CONTRAST No acute injury on the thoracic spine.  Patient does have a chronic T3 compression fracture [HD]  1427 MR Cervical Spine Wo Contrast [HD]  1428 . Positive for anterior and apical ligamentous injury at  the C1-odontoid level in association with anterior C1 Ring Fracture on CT.   [HD]  1428 Will discuss the case with neurosurgery [HD]  1434 [place]  [HD]  1436 Case discussed with Dr. Clois of neurosurgery.  He states that if the patient wants all interventions he would recommend a Aspen collar or [place] J for 3 months and follow-up in his office.  However there should be a goals of care discussion with the patient's daughter to determine what needs to be done as placing in a collar can limit quality of life [HD]  1443 Patient's daughter Hadassah updated and is discussing with family [HD]  1537 Urinalysis, Routine w reflex microscopic -Urine, Catheterized(!) Urinalysis not consistent with infection [HD]  1546 Patients family has chosen to pursue c-collar placement.  Dr. Katrina notified and he will arrange for follow-up.  We will provide the patient with the Aspen collar and a [place] collar for hygiene.  All questions answered and patient's daughter requested discharge [HD]  1650 After I had discharge patient, I was notified that the patient was not welcome back at her dementia unit given the c-collar.  I discussed the case with Nathanel Croft administrator and counseled the administrator that patient's family is aware that the patient may not  tolerate the c-collar and that she is at increased risk for falls and decreased eating with it on and that given that she has tolerated this fall in our emergency department this is a do the best you can situation.  Administrator requested that I amend my discharge instructions to state such which I have done so.  I also called [name], the patient's daughter and explained the situation.  Patient's daughter voiced understanding and is in agreement that the best thing for her mother at this time is to return back to the dementia unit although they may consider possible skilled nursing facility in the future.  All questions answered and patient's daughter voiced an understanding and the plan right now is to discharge the patient to her facility [HD]    Clinical Course User Index [HD] Nicholaus Rolland BRAVO, MD   Risk: 5 This patient has a high risk of morbidity due to further diagnostic testing or treatment. Rationale: This patient's evaluation and management involve a high risk of morbidity due to the potential severity of presenting symptoms, need for diagnostic testing, and/or initiation of treatment that may require close monitoring. The differential includes conditions with potential for significant deterioration or requiring escalation of care. Treatment decisions in the ED, including medication administration, procedural interventions, or disposition planning, reflect this level of risk. Additional Support: -- Drug therapy requiring intensive monitoring for toxicity [ ]  -- Decision regarding elective major surgery with idenitified patient or procedure risk factors [ ]  -- Decision regarding hospitalization or escalation of hospital-level care [ ]  -- Decision not to resuscitate or to de-escalate care because of poor prognosis [ ]  -- Parental controlled substances [ ]   COPA: 5 The patient has a severe exacerbation, progression, or side effect of treatment of the following illness/illnesses: []  OR  The  patient has the following acute or chronic illness/injury that poses a possible threat to life or bodily function: [X] : The patient has a potentially serious acute condition or an acute exacerbation of a chronic illness requiring urgent evaluation and management in the Emergency Department. The clinical presentation necessitates immediate consideration of life-threatening or function-threatening diagnoses, even if they are ultimately ruled out.  Data(2/3 categories following were performed): 5 I reviewed or  ordered at least three unique tests, external notes, and/or the history required an independent historian as one of the three requirements as following: CBC, BMP, urinalysis, daughter AND  I independently interpreted the following test: CT head without contrast OR  I discussed the management of the patient with the following external physician or qualified healthcare provider: Neurosurgery consult    Suggested E/M Coding Level: 5, 99285, This has been selected based on the Jan 24, 2022 CPT guidelines for E/M codes in the Emergency Department based on 2/3 of the CoPA, Data, and Risk.    FINAL CLINICAL IMPRESSION(S) / ED DIAGNOSES   Final diagnoses:  Fall, initial encounter  Injury of cervical spine, initial encounter (HCC)  Closed fracture of nasal bone, initial encounter     Rx / DC Orders   ED Discharge Orders     None        Note:  This document was prepared using Dragon voice recognition software and may include unintentional dictation errors.   Nicholaus Rolland BRAVO, MD 01/07/24 1548    Nicholaus Rolland BRAVO, MD 01/07/24 [phone]

## 2024-01-07 NOTE — ED Triage Notes (Signed)
 Pt presents to the ED via [facility] from [facility]. Unwitnessed fall. Abrasions noted to nose and forehead. Pt is on eliquis . No known LOC. Hx of dementia. Pupils round, reactive and equal. Pt complaing of neck pain. Placed in c-collar at time of triage.

## 2024-01-07 NOTE — ED Notes (Addendum)
 Pt gone to MRI

## 2024-01-07 NOTE — Discharge Instructions (Addendum)
 You were seen in the emergency department for a fall.  You had nasal bone fractures and a cervical spine injury.  Please do the following  1) As discussed with your [name], given your dementia you may not tolerate the cervical collar at all times and you may remove the cervical collar given your dementia.  Your daughter Ronal understands this risk and this is a do the best as you can tolerate saturation.  Your [name] also understands that you may have increased difficulty eating or be at increased risk for falls wearing the cervical collar but we would like an attempt made if at all possible.  If you are unable to tolerate the cervical collar for prolonged periods of time, please let your daughter know  In an ideal world, other or involved understands this may not be possible, do the following: Follow-up with the neurosurgery team in 2 weeks.  Wear your cervical collar as much as possible  For hygiene you may lay flat and switch out the collar to shower (try to use as much C-spine precautions as possible).  And then switch back to the Aspen collar which cannot get wet afterwards  2) try not to blow anything out through your nose.  If you develop difficulty breathing through your nose you may follow-up with the ENT specialist.   Return with any acutely worsening symptoms.  I will wish you the best of luck and is very nice taking care of you. Rolland Moats, MD, PhD -- RETURN PRECAUTIONS & AFTERCARE: (ENGLISH) RETURN PRECAUTIONS: Return immediately to the emergency department or see/call your doctor if you feel worse, weak or have changes in speech or vision, are short of breath, have fever, vomiting, pain, bleeding or dark stool, trouble urinating or any new issues. Return here or see/call your doctor if not improving as expected for your suspected condition. FOLLOW-UP CARE: Call your doctor and/or any doctors we referred you to for more advice and to make an appointment. Do this today,  tomorrow or after the weekend. Some doctors only take PPO insurance so if you have HMO insurance you may want to contact your HMO or your regular doctor for referral to a specialist within your plan. Either way tell the doctor's office that it was a referral from the emergency department so you get the soonest possible appointment.  YOUR TEST RESULTS: Take result reports of any blood or urine tests, imaging tests and EKG's to your doctor and any referral doctor. Have any abnormal tests repeated. Your doctor or a referral doctor can let you know when this should be done. Also make sure your doctor contacts this hospital to get any test results that are not currently available such as cultures or special tests for infection and final imaging reports, which are often not available at the time you leave the ER but which may list additional important findings that are not documented on the preliminary report. BLOOD PRESSURE: If your blood pressure was greater than 120/80 have your blood pressure rechecked within 1 to 2 weeks. MEDICATION SIDE EFFECTS: Do not drive, walk, bike, take the bus, etc. if you have received or are being prescribed any sedating medications such as those for pain or anxiety or certain antihistamines like Benadryl. If you have been give one of these here get a taxi home or have a friend drive you home. Ask your pharmacist to counsel you on potential side effects of any new medication

## 2024-01-07 NOTE — ED Notes (Signed)
 [facility] arrived to transport patient. Pt stable at time of departure.

## 2024-01-07 NOTE — ED Notes (Signed)
 Report called to [employer] to [place], [place].

## 2024-01-09 ENCOUNTER — Telehealth: Payer: Self-pay | Admitting: Physician Assistant

## 2024-01-09 NOTE — Telephone Encounter (Signed)
 Amber from [facility] (primary care) called to discuss the collar that was given to the patient at the hospital is too big for the patient. They are not sure where to get this adjusted or who to speak with about this issue.  Also wanting to know if she is okay to leave the collar on her her xrays as she was told the family and care givers were too afraid to remove the collar off of her?  Patient has an appt with our office on 8.20.25 but is currently being admitted to hospice [facility]); they will bring her to all follow up appointments.

## 2024-01-10 NOTE — Telephone Encounter (Addendum)
 I spoke to patient's daughter Anne Oneill and she has been admitted to Hospice but they told her that she can keep the appointment on 8/20 and she could only have a x ray to check the fracture. She states she is wanting to get the mobile X ray to come to the patient and get the x rays. I told her that is ok as long as she gets the disk and the report and has it with her at her appointment.   She states her mother choked on her saliva yesterday and started to change colors before she was able to get it cleared. She also choked again this morning on her milkshake. Anne Oneill is thinking it is from her fracture.   She states that the collar is coming up and her chin will fall in the collar. I informed her that we are still not sure who the hospital is using for the collars that are given, but per Dr. Clois she is able to pick up a white soft collar and it may be more comfortable for the patient.

## 2024-01-10 NOTE — Telephone Encounter (Addendum)
 If they can do AP/lateral cervical portable xrays then can do portable. If not, she needs to go to [facility]. Discussed with Dr. [name].   She can remove collar when she is eating.

## 2024-01-10 NOTE — Telephone Encounter (Signed)
 I spoke to Anne Oneill and informed her of the message below, I spoke to Dr [name] and it is ok to not wear the brace when she is eating. She is going to find out about the images and let us  know if they can do it. She will be sure to have the dis and the report when she comes in for the appointment unless they have to go to [facility].

## 2024-01-21 NOTE — Telephone Encounter (Signed)
 [name] (daughter) called to advise [facility] cannot do the xrays. She will have patient brought to [facility] prior to appt to have xrays done.   FYI - thanks!

## 2024-01-23 ENCOUNTER — Other Ambulatory Visit: Payer: Self-pay

## 2024-01-23 DIAGNOSIS — S14109D Unspecified injury at unspecified level of cervical spinal cord, subsequent encounter: Secondary | ICD-10-CM

## 2024-01-23 NOTE — Progress Notes (Deleted)
 Referring Physician:  Fernande Ophelia JINNY DOUGLAS, MD [address] [facility] [place],  [place] [address]  Primary Physician:  [facility]  History of Present Illness: 01/23/2024 Anne Oneill is here today with a chief complaint of ***  Resides at [place] and had an unwitnessed fall on 01/07/2024. anterior C1 Ring Fracture  Wearing C collar?     Past Surgery: ***none?  Anne Oneill has ***no symptoms of cervical myelopathy.  The symptoms are causing a significant impact on the patient's life.   Review of Systems:  A 10 point review of systems is negative, except for the pertinent positives and negatives detailed in the HPI.  Past Medical History: Past Medical History:  Diagnosis Date   Arthritis    Cancer (HCC)    Dementia (HCC)    mild   DVT (deep venous thrombosis) (HCC)    High cholesterol    Hyperlipemia    Osteoporosis    Squamous cell carcinoma of skin 02/19/2007   R cheek    Past Surgical History: Past Surgical History:  Procedure Laterality Date   ABDOMINAL HYSTERECTOMY     EYE SURGERY     FRACTURE SURGERY     MASTECTOMY Right    SKIN BIOPSY      Allergies: Allergies as of 01/29/2024 - Review Complete 01/07/2024  Allergen Reaction Noted   Codeine Nausea Only 12/11/2017   Ditropan [oxybutynin]  11/23/2019   Hydrocodone Nausea Only 12/11/2017   Morphine  Nausea Only 12/11/2017   Tramadol  Nausea Only 12/11/2017   Latex Rash 12/11/2017    Medications: Outpatient Encounter Medications as of 01/29/2024  Medication Sig   acetaminophen  (TYLENOL ) 500 MG tablet Take 500 mg by mouth every 8 (eight) hours as needed for mild pain.   apixaban  (ELIQUIS ) 2.5 MG TABS tablet Take 2.5 mg by mouth 2 (two) times daily.   calcium -vitamin D  (OSCAL WITH D) 500-5 MG-MCG tablet Take 1 tablet by mouth 2 (two) times daily.   cyanocobalamin  (VITAMIN B12) 1000 MCG tablet Take 1,000 mcg by mouth daily.   donepezil  (ARICEPT ) 10 MG tablet  Take 10 mg by mouth daily.   escitalopram  (LEXAPRO ) 20 MG tablet Take 20 mg by mouth daily.   feeding supplement (ENSURE ENLIVE / ENSURE PLUS) LIQD Take 237 mLs by mouth 3 (three) times daily between meals.   HYDROmorphone  (DILAUDID ) 2 MG tablet Take 0.5 tablets (1 mg total) by mouth every 6 (six) hours as needed for severe pain. (Patient not taking: Reported on 07/27/2022)   memantine  (NAMENDA ) 10 MG tablet Take 10 mg by mouth 2 (two) times daily.   mirabegron  ER (MYRBETRIQ ) 50 MG TB24 tablet Take 50 mg by mouth daily.   Multiple Vitamins-Minerals (PRESERVISION AREDS 2+MULTI VIT PO) Take 1 capsule by mouth 2 (two) times daily.   polyethylene glycol (MIRALAX  / GLYCOLAX ) 17 g packet Take 17 g by mouth daily as needed.   simvastatin  (ZOCOR ) 20 MG tablet Take 20 mg by mouth at bedtime.   No facility-administered encounter medications on file as of 01/29/2024.    Social History: Social History   Tobacco Use   Smoking status: Never   Smokeless tobacco: Never  Substance Use Topics   Alcohol use: Not Currently   Drug use: Not Currently    Family Medical History: No family history on file.  Physical Examination: @VITALWITHPAIN @  General: Patient is well developed, well nourished, calm, collected, and in no apparent distress. Attention to examination is appropriate.  Psychiatric: Patient  is non-anxious.  Head:  Pupils equal, round, and reactive to light.  ENT:  Oral mucosa appears well hydrated.  Neck:   Supple.  ***Full range of motion.  Respiratory: Patient is breathing without any difficulty.  Extremities: No edema.  Vascular: Palpable dorsal pedal pulses.  Skin:   On exposed skin, there are no abnormal skin lesions.  NEUROLOGICAL:     Awake, alert, oriented to person, place, and time.  Speech is clear and fluent. Fund of knowledge is appropriate.   Cranial Nerves: Pupils equal round and reactive to light.  Facial tone is symmetric.  Facial sensation is symmetric.  ROM of  spine: ***full.  Palpation of spine: ***non tender.    Strength: Side Biceps Triceps Deltoid Interossei Grip Wrist Ext. Wrist Flex.  R 5 5 5 5 5 5 5   L 5 5 5 5 5 5 5    Side Iliopsoas Quads Hamstring PF DF EHL  R 5 5 5 5 5 5   L 5 5 5 5 5 5    Reflexes are ***2+ and symmetric at the biceps, triceps, brachioradialis, patella and achilles.   Hoffman's is absent.  Clonus is not present.  Toes are down-going.  Bilateral upper and lower extremity sensation is intact to light touch.    Gait is normal.   No difficulty with tandem gait.   No evidence of dysmetria noted.  Medical Decision Making  Imaging: ***  I have personally reviewed the images and agree with the above interpretation.  Assessment and Plan: [name] is a pleasant 89 y.o. female with ***    Thank you for involving me in the care of this patient.   I spent a total of *** minutes in both face-to-face and non-face-to-face activities for this visit on the date of this encounter.   Lyle Decamp, PA-C Dept. of Neurosurgery

## 2024-01-29 ENCOUNTER — Ambulatory Visit: Admitting: Physician Assistant

## 2024-02-11 ENCOUNTER — Emergency Department

## 2024-02-11 ENCOUNTER — Encounter: Payer: Self-pay | Admitting: Medical Oncology

## 2024-02-11 ENCOUNTER — Other Ambulatory Visit: Payer: Self-pay

## 2024-02-11 ENCOUNTER — Inpatient Hospital Stay
Admission: EM | Admit: 2024-02-11 | Discharge: 2024-02-18 | DRG: 522 | Disposition: A | Discharge status: Skilled Nursing Facility | Source: Skilled Nursing Facility | Attending: Student | Admitting: Student

## 2024-02-11 DIAGNOSIS — Z91040 Latex allergy status: Secondary | ICD-10-CM | POA: Diagnosis not present

## 2024-02-11 DIAGNOSIS — S12000A Unspecified displaced fracture of first cervical vertebra, initial encounter for closed fracture: Secondary | ICD-10-CM | POA: Diagnosis present

## 2024-02-11 DIAGNOSIS — S129XXD Fracture of neck, unspecified, subsequent encounter: Secondary | ICD-10-CM | POA: Diagnosis not present

## 2024-02-11 DIAGNOSIS — F02C0 Dementia in other diseases classified elsewhere, severe, without behavioral disturbance, psychotic disturbance, mood disturbance, and anxiety: Secondary | ICD-10-CM | POA: Diagnosis present

## 2024-02-11 DIAGNOSIS — Z888 Allergy status to other drugs, medicaments and biological substances status: Secondary | ICD-10-CM

## 2024-02-11 DIAGNOSIS — Y92099 Unspecified place in other non-institutional residence as the place of occurrence of the external cause: Secondary | ICD-10-CM

## 2024-02-11 DIAGNOSIS — Z8249 Family history of ischemic heart disease and other diseases of the circulatory system: Secondary | ICD-10-CM | POA: Diagnosis not present

## 2024-02-11 DIAGNOSIS — Z90710 Acquired absence of both cervix and uterus: Secondary | ICD-10-CM

## 2024-02-11 DIAGNOSIS — E872 Acidosis, unspecified: Secondary | ICD-10-CM | POA: Diagnosis present

## 2024-02-11 DIAGNOSIS — S129XXA Fracture of neck, unspecified, initial encounter: Secondary | ICD-10-CM

## 2024-02-11 DIAGNOSIS — M810 Age-related osteoporosis without current pathological fracture: Secondary | ICD-10-CM | POA: Diagnosis present

## 2024-02-11 DIAGNOSIS — Z7901 Long term (current) use of anticoagulants: Secondary | ICD-10-CM

## 2024-02-11 DIAGNOSIS — Z66 Do not resuscitate: Secondary | ICD-10-CM | POA: Diagnosis present

## 2024-02-11 DIAGNOSIS — S12100D Unspecified displaced fracture of second cervical vertebra, subsequent encounter for fracture with routine healing: Secondary | ICD-10-CM | POA: Diagnosis not present

## 2024-02-11 DIAGNOSIS — M25552 Pain in left hip: Secondary | ICD-10-CM | POA: Diagnosis present

## 2024-02-11 DIAGNOSIS — Z85828 Personal history of other malignant neoplasm of skin: Secondary | ICD-10-CM

## 2024-02-11 DIAGNOSIS — F0280 Dementia in other diseases classified elsewhere without behavioral disturbance: Secondary | ICD-10-CM | POA: Diagnosis not present

## 2024-02-11 DIAGNOSIS — Z885 Allergy status to narcotic agent status: Secondary | ICD-10-CM

## 2024-02-11 DIAGNOSIS — G309 Alzheimer's disease, unspecified: Secondary | ICD-10-CM | POA: Diagnosis present

## 2024-02-11 DIAGNOSIS — F01C0 Vascular dementia, severe, without behavioral disturbance, psychotic disturbance, mood disturbance, and anxiety: Secondary | ICD-10-CM | POA: Diagnosis present

## 2024-02-11 DIAGNOSIS — Z823 Family history of stroke: Secondary | ICD-10-CM

## 2024-02-11 DIAGNOSIS — Z8673 Personal history of transient ischemic attack (TIA), and cerebral infarction without residual deficits: Secondary | ICD-10-CM

## 2024-02-11 DIAGNOSIS — E639 Nutritional deficiency, unspecified: Secondary | ICD-10-CM | POA: Diagnosis present

## 2024-02-11 DIAGNOSIS — S7292XA Unspecified fracture of left femur, initial encounter for closed fracture: Secondary | ICD-10-CM | POA: Diagnosis present

## 2024-02-11 DIAGNOSIS — E7800 Pure hypercholesterolemia, unspecified: Secondary | ICD-10-CM | POA: Diagnosis present

## 2024-02-11 DIAGNOSIS — R1310 Dysphagia, unspecified: Secondary | ICD-10-CM | POA: Diagnosis present

## 2024-02-11 DIAGNOSIS — Z79899 Other long term (current) drug therapy: Secondary | ICD-10-CM

## 2024-02-11 DIAGNOSIS — Z853 Personal history of malignant neoplasm of breast: Secondary | ICD-10-CM | POA: Diagnosis not present

## 2024-02-11 DIAGNOSIS — D696 Thrombocytopenia, unspecified: Secondary | ICD-10-CM | POA: Diagnosis present

## 2024-02-11 DIAGNOSIS — M8000XS Age-related osteoporosis with current pathological fracture, unspecified site, sequela: Secondary | ICD-10-CM | POA: Diagnosis not present

## 2024-02-11 DIAGNOSIS — I160 Hypertensive urgency: Secondary | ICD-10-CM | POA: Diagnosis present

## 2024-02-11 DIAGNOSIS — R296 Repeated falls: Secondary | ICD-10-CM

## 2024-02-11 DIAGNOSIS — F03C0 Unspecified dementia, severe, without behavioral disturbance, psychotic disturbance, mood disturbance, and anxiety: Secondary | ICD-10-CM

## 2024-02-11 DIAGNOSIS — S72042A Displaced fracture of base of neck of left femur, initial encounter for closed fracture: Secondary | ICD-10-CM | POA: Diagnosis not present

## 2024-02-11 DIAGNOSIS — S72002A Fracture of unspecified part of neck of left femur, initial encounter for closed fracture: Principal | ICD-10-CM | POA: Diagnosis present

## 2024-02-11 DIAGNOSIS — S12100A Unspecified displaced fracture of second cervical vertebra, initial encounter for closed fracture: Secondary | ICD-10-CM

## 2024-02-11 DIAGNOSIS — Z86718 Personal history of other venous thrombosis and embolism: Secondary | ICD-10-CM

## 2024-02-11 DIAGNOSIS — S12110A Anterior displaced Type II dens fracture, initial encounter for closed fracture: Secondary | ICD-10-CM | POA: Diagnosis present

## 2024-02-11 DIAGNOSIS — E782 Mixed hyperlipidemia: Secondary | ICD-10-CM | POA: Diagnosis not present

## 2024-02-11 DIAGNOSIS — F0150 Vascular dementia without behavioral disturbance: Secondary | ICD-10-CM | POA: Diagnosis not present

## 2024-02-11 DIAGNOSIS — K5900 Constipation, unspecified: Secondary | ICD-10-CM | POA: Diagnosis present

## 2024-02-11 DIAGNOSIS — S12112A Nondisplaced Type II dens fracture, initial encounter for closed fracture: Secondary | ICD-10-CM

## 2024-02-11 DIAGNOSIS — Z9011 Acquired absence of right breast and nipple: Secondary | ICD-10-CM

## 2024-02-11 DIAGNOSIS — M1990 Unspecified osteoarthritis, unspecified site: Secondary | ICD-10-CM | POA: Diagnosis present

## 2024-02-11 DIAGNOSIS — W010XXA Fall on same level from slipping, tripping and stumbling without subsequent striking against object, initial encounter: Secondary | ICD-10-CM | POA: Diagnosis present

## 2024-02-11 DIAGNOSIS — S1201XA Stable burst fracture of first cervical vertebra, initial encounter for closed fracture: Secondary | ICD-10-CM

## 2024-02-11 DIAGNOSIS — E785 Hyperlipidemia, unspecified: Secondary | ICD-10-CM | POA: Diagnosis present

## 2024-02-11 LAB — COMPREHENSIVE METABOLIC PANEL WITH GFR
ALT: 19 U/L (ref 0–44)
AST: 22 U/L (ref 15–41)
Albumin: 3.8 g/dL (ref 3.5–5.0)
Alkaline Phosphatase: 71 U/L (ref 38–126)
Anion gap: 11 (ref 5–15)
BUN: 29 mg/dL — ABNORMAL HIGH (ref 8–23)
CO2: 23 mmol/L (ref 22–32)
Calcium: 9.3 mg/dL (ref 8.9–10.3)
Chloride: 103 mmol/L (ref 98–111)
Creatinine, Ser: 0.77 mg/dL (ref 0.44–1.00)
GFR, Estimated: >60 mL/min (ref >60)
Glucose, Bld: 126 mg/dL — ABNORMAL HIGH (ref 70–99)
Potassium: 4.4 mmol/L (ref 3.5–5.1)
Sodium: 137 mmol/L (ref 135–145)
Total Bilirubin: 0.9 mg/dL (ref 0.0–1.2)
Total Protein: 7.1 g/dL (ref 6.5–8.1)

## 2024-02-11 LAB — CBC WITH DIFFERENTIAL/PLATELET
Abs Immature Granulocytes: 0.06 K/uL (ref 0.00–0.07)
Basophils Absolute: 0 K/uL (ref 0.0–0.1)
Basophils Relative: 0 %
Eosinophils Absolute: 0.1 K/uL (ref 0.0–0.5)
Eosinophils Relative: 1 %
HCT: 41.9 % (ref 36.0–46.0)
Hemoglobin: 13.9 g/dL (ref 12.0–15.0)
Immature Granulocytes: 1 %
Lymphocytes Relative: 21 %
Lymphs Abs: 1.6 K/uL (ref 0.7–4.0)
MCH: 30.5 pg (ref 26.0–34.0)
MCHC: 33.2 g/dL (ref 30.0–36.0)
MCV: 92.1 fL (ref 80.0–100.0)
Monocytes Absolute: 0.5 K/uL (ref 0.1–1.0)
Monocytes Relative: 6 %
Neutro Abs: 5.6 K/uL (ref 1.7–7.7)
Neutrophils Relative %: 71 %
Platelets: 211 K/uL (ref 150–400)
RBC: 4.55 MIL/uL (ref 3.87–5.11)
RDW: 13 % (ref 11.5–15.5)
WBC: 7.9 K/uL (ref 4.0–10.5)
nRBC: 0 % (ref 0.0–0.2)

## 2024-02-11 LAB — SAMPLE TO BLOOD BANK

## 2024-02-11 MED ORDER — HYDRALAZINE HCL 20 MG/ML IJ SOLN
5.0000 mg | Freq: Four times a day (QID) | INTRAMUSCULAR | Status: DC | PRN
  Administered 2024-02-11 – 2024-02-12 (×3): 5 mg via INTRAVENOUS
  Filled 2024-02-11 (×3): qty 1

## 2024-02-11 MED ORDER — ACETAMINOPHEN 650 MG RE SUPP
650.0000 mg | Freq: Four times a day (QID) | RECTAL | Status: DC | PRN
Start: 2024-02-11 — End: 2024-02-16

## 2024-02-11 MED ORDER — FENTANYL CITRATE PF 50 MCG/ML IJ SOSY
25.0000 ug | PREFILLED_SYRINGE | INTRAMUSCULAR | Status: DC | PRN
  Administered 2024-02-11: 25 ug via INTRAVENOUS
  Filled 2024-02-11: qty 1

## 2024-02-11 MED ORDER — SPIRONOLACTONE 25 MG PO TABS
25.0000 mg | ORAL_TABLET | Freq: Every day | ORAL | Status: DC
  Filled 2024-02-11: qty 1

## 2024-02-11 MED ORDER — ACETAMINOPHEN 325 MG PO TABS
650.0000 mg | ORAL_TABLET | Freq: Four times a day (QID) | ORAL | Status: DC | PRN
Start: 2024-02-11 — End: 2024-02-16

## 2024-02-11 MED ORDER — ONDANSETRON HCL 4 MG PO TABS
4.0000 mg | ORAL_TABLET | Freq: Four times a day (QID) | ORAL | Status: DC | PRN
Start: 2024-02-11 — End: 2024-02-16

## 2024-02-11 MED ORDER — ESCITALOPRAM OXALATE 10 MG PO TABS
10.0000 mg | ORAL_TABLET | Freq: Every day | ORAL | Status: DC
  Administered 2024-02-13 – 2024-02-18 (×6): 10 mg via ORAL
  Filled 2024-02-11 (×7): qty 1

## 2024-02-11 MED ORDER — CEFAZOLIN SODIUM-DEXTROSE 2-4 GM/100ML-% IV SOLN
2.0000 g | INTRAVENOUS | Status: AC
  Administered 2024-02-12: 2 g via INTRAVENOUS

## 2024-02-11 MED ORDER — ONDANSETRON HCL 4 MG/2ML IJ SOLN
4.0000 mg | Freq: Four times a day (QID) | INTRAMUSCULAR | Status: DC | PRN
  Administered 2024-02-12: 4 mg via INTRAVENOUS

## 2024-02-11 MED ORDER — OXYCODONE HCL 5 MG PO TABS: 5.0000 mg | ORAL_TABLET | Freq: Four times a day (QID) | ORAL | Status: DC | PRN

## 2024-02-11 MED ORDER — SIMVASTATIN 20 MG PO TABS
20.0000 mg | ORAL_TABLET | Freq: Every day | ORAL | Status: DC
  Administered 2024-02-13 – 2024-02-17 (×5): 20 mg via ORAL
  Filled 2024-02-11: qty 2
  Filled 2024-02-11 (×5): qty 1

## 2024-02-11 MED ORDER — METOPROLOL SUCCINATE ER 50 MG PO TB24
50.0000 mg | ORAL_TABLET | Freq: Every day | ORAL | Status: DC
Start: 2024-02-12 — End: 2024-02-18
  Administered 2024-02-13 – 2024-02-18 (×6): 50 mg via ORAL
  Filled 2024-02-11 (×7): qty 1

## 2024-02-11 MED ORDER — HEPARIN SODIUM (PORCINE) 5000 UNIT/ML IJ SOLN
5000.0000 [IU] | Freq: Three times a day (TID) | INTRAMUSCULAR | Status: DC
Start: 2024-02-11 — End: 2024-02-11

## 2024-02-11 MED ORDER — VITAMIN B-12 1000 MCG PO TABS
1000.0000 ug | ORAL_TABLET | Freq: Every day | ORAL | Status: DC
  Administered 2024-02-14 – 2024-02-18 (×5): 1000 ug via ORAL
  Filled 2024-02-11 (×6): qty 1
  Filled 2024-02-11: qty 2

## 2024-02-11 MED ORDER — ONDANSETRON HCL 4 MG/2ML IJ SOLN
4.0000 mg | Freq: Once | INTRAMUSCULAR | Status: AC
  Administered 2024-02-11: 4 mg via INTRAVENOUS
  Filled 2024-02-11: qty 2

## 2024-02-11 MED ORDER — SODIUM CHLORIDE 0.9 % IV BOLUS
500.0000 mL | Freq: Once | INTRAVENOUS | Status: AC
  Administered 2024-02-11: 500 mL via INTRAVENOUS

## 2024-02-11 MED ORDER — SODIUM CHLORIDE 0.9 % IV SOLN: INTRAVENOUS | Status: DC

## 2024-02-11 MED ORDER — MEMANTINE HCL 5 MG PO TABS: 10.0000 mg | ORAL_TABLET | Freq: Two times a day (BID) | ORAL | Status: DC

## 2024-02-11 MED ORDER — MORPHINE SULFATE (PF) 4 MG/ML IV SOLN
4.0000 mg | Freq: Once | INTRAVENOUS | Status: AC
  Administered 2024-02-11: 4 mg via INTRAVENOUS
  Filled 2024-02-11: qty 1

## 2024-02-11 NOTE — Consult Note (Addendum)
 ORTHOPAEDIC CONSULTATION  REQUESTING PHYSICIAN: Viviann Pastor, MD  Chief Complaint:   Left hip pain.  History of Present Illness: Anne Oneill is a 89 y.o. female with a history of dementia, hypercholesterolemia, hyperlipidemia, and osteoporosis who normally lives in [facility] memory care.  The patient was in her usual state of health until this afternoon when she apparently sustained an unwitnessed fall and struck her head.  She also complained of left hip pain and was noted to have deformity of the left lower extremity.  She was brought to the emergency room where x-rays demonstrated a displaced left femoral neck fracture.  The patient is a poor historian.  Work-up in the emergency room has included a CT scan of the cervical spine and head.  The head CT was unremarkable for any acute processes.  However, the cervical CT shows a new non-displaced dens fracture, as well as apparent further displacement of a subacute C1 arch fracture.  Neurosurgery as been consulted to weigh in on these findings.  The patient is being admitted to the hospital service in preparation for definitive management of her left hip fracture.  Past Medical History:  Diagnosis Date   Arthritis    Cancer (HCC)    Dementia (HCC)    mild   DVT (deep venous thrombosis) (HCC)    High cholesterol    Hyperlipemia    Osteoporosis    Squamous cell carcinoma of skin 02/19/2007   R cheek   Past Surgical History:  Procedure Laterality Date   ABDOMINAL HYSTERECTOMY     EYE SURGERY     FRACTURE SURGERY     MASTECTOMY Right    SKIN BIOPSY     Social History   Socioeconomic History   Marital status: Widowed    Spouse name: Not on file   Number of children: Not on file   Years of education: Not on file   Highest education level: Not on file  Occupational History   Not on file  Tobacco Use   Smoking status: Never   Smokeless tobacco: Never   Substance and Sexual Activity   Alcohol use: Not Currently   Drug use: Not Currently   Sexual activity: Not on file  Other Topics Concern   Not on file  Social History Narrative   Not on file   Social Drivers of Health   Financial Resource Strain: Not on file  Food Insecurity: No Food Insecurity (02/23/2022)   Hunger Vital Sign    Worried About Running Out of Food in the Last Year: Never true    Ran Out of Food in the Last Year: Never true  Transportation Needs: No Transportation Needs (02/23/2022)   PRAPARE - Administrator, Civil Service (Medical): No    Lack of Transportation (Non-Medical): No  Physical Activity: Not on file  Stress: Not on file  Social Connections: Not on file   History reviewed. No pertinent family history. Allergies  Allergen Reactions   Codeine Nausea Only   Ditropan [Oxybutynin]    Hydrocodone Nausea Only   Morphine  Nausea Only   Tramadol  Nausea Only   Latex Rash   Prior to Admission medications   Medication Sig Start Date End Date Taking? Authorizing Provider  acetaminophen  (TYLENOL ) 500 MG tablet Take 500 mg by mouth every 8 (eight) hours as needed for mild pain.    [provider]  apixaban  (ELIQUIS ) 2.5 MG TABS tablet Take 2.5 mg by mouth 2 (two) times daily.    [provider]  calcium -vitamin D  (OSCAL WITH D) 500-5 MG-MCG tablet Take 1 tablet by mouth 2 (two) times daily. 02/27/22   Josette Ade, MD  cyanocobalamin  (VITAMIN B12) 1000 MCG tablet Take 1,000 mcg by mouth daily.    [provider]  donepezil  (ARICEPT ) 10 MG tablet Take 10 mg by mouth daily.    [provider]  escitalopram  (LEXAPRO ) 20 MG tablet Take 20 mg by mouth daily.    [provider]  feeding supplement (ENSURE ENLIVE / ENSURE PLUS) LIQD Take 237 mLs by mouth 3 (three) times daily between meals. 02/27/22   Josette Ade, MD  HYDROmorphone  (DILAUDID ) 2 MG tablet Take 0.5 tablets (1 mg total) by mouth every 6 (six)  hours as needed for severe pain. Patient not taking: Reported on 07/27/2022 02/27/22   Josette Ade, MD  memantine  (NAMENDA ) 10 MG tablet Take 10 mg by mouth 2 (two) times daily.    [provider]  mirabegron  ER (MYRBETRIQ ) 50 MG TB24 tablet Take 50 mg by mouth daily.    [provider]  Multiple Vitamins-Minerals (PRESERVISION AREDS 2+MULTI VIT PO) Take 1 capsule by mouth 2 (two) times daily.    [provider]  polyethylene glycol (MIRALAX  / GLYCOLAX ) 17 g packet Take 17 g by mouth daily as needed. 02/27/22   Josette Ade, MD  simvastatin  (ZOCOR ) 20 MG tablet Take 20 mg by mouth at bedtime.    [provider]   CT Cervical Spine Wo Contrast Result Date: 02/11/2024 CLINICAL DATA:  Provided history: Neck trauma. Unwitnessed fall. EXAM: CT CERVICAL SPINE WITHOUT CONTRAST TECHNIQUE: Multidetector CT imaging of the cervical spine was performed without intravenous contrast. Multiplanar CT image reconstructions were also generated. RADIATION DOSE REDUCTION: This exam was performed according to the departmental dose-optimization program which includes automated exposure control, adjustment of the mA and/or kV according to patient size and/or use of iterative reconstruction technique. COMPARISON:  Cervical spine MRI 01/07/2024. Cervical spine CT 01/07/2024. FINDINGS: Alignment: Since the prior cervical spine CT of 01/07/2024, there has been progressive displacement at site of the known fracture of the C1 anterior arch (located just to the left of midline). Displacement at this site now measures 4 mm. There is now asymmetric widening of the lateral atlantodental interval on the left (measuring 6 mm) (series 4, image 21). 3 mm C3-C4 grade 1 anterolisthesis. 4 mm C4-C5 grade 1 anterolisthesis. 2 mm C5-C6 grade 1 anterolisthesis. 2 mm C7-T1 grade 1 anterolisthesis. Slight T1-T2 grade 1 anterolisthesis. 4 mm bony retropulsion at the level of the T3 superior endplate. Nonspecific  reversal of the expected cervical lordosis. Dextrocurvature of the cervical spine. Levocurvature of the upper thoracic spine. Skull base and vertebrae: New nondisplaced acute fracture of the C1 posterior arch on the left (best appreciated on series 7, images 20 in 21). New acute nondisplaced type III odontoid fracture. Redemonstrated T3 vertebral compression fracture. Approximately 60% vertebral body height loss at this level, unchanged from the prior CT. 4 mm bony retropulsion at the level of the T3 superior endplate, also unchanged. Soft tissues and spinal canal: No prevertebral fluid or swelling. No visible canal hematoma. Disc levels: Cervical spondylosis with multilevel disc space narrowing, disc bulges/central disc protrusions, posterior disc osteophyte complexes, uncovertebral hypertrophy and facet arthropathy. Disc space narrowing is greatest at C4-C5 (moderate-to-advanced), C5-C6 (advanced) and C6-C7 (advanced). Degenerative changes also present at the C1-C2 articulation. Upper chest: No consolidation within the imaged lung apices. No visible pneumothorax. Biapical pleuroparenchymal scarring. Other: Multinodular thyroid  gland,  previously assessed with thyroid  ultrasound on 01/18/2010. Please refer to this prior examination for further description. Attempts are being made to reach the ordering provider at this time. IMPRESSION: 1. Acute, nondisplaced type III odontoid fracture. 2. Acute nondisplaced fracture of the C1 posterior arch on the left. 3. Progressive displacement at site of a known fracture within the C1 anterior arch (located just to the left of midline). Displacement at this fracture site now measures 4 mm. 4. The left lateral atlantodental interval is now widened to 6 mm. A cervical spine MRI is recommended to exclude any new ligamentous injury. 5. Unchanged T3 compression fracture (with 60% vertebral body height loss and 4 mm bony retropulsion. 6. Nonspecific reversal of the expected cervical  lordosis. 7. Grade 1 spondylolisthesis at C3-C4, C4-C5, C7-T1 and T1-T2. 8. Nonspecific reversal of the expected cervical lordosis. 9. Mild dextrocurvature of the cervical spine. 10. Cervical spondylosis as described. Electronically Signed   By: Rockey Childs D.O.   On: 02/11/2024 18:24    Positive ROS: All other systems have been reviewed and were otherwise negative with the exception of those mentioned in the HPI and as above.  Physical Exam: General:  Alert, no acute distress Psychiatric:  Patient is not competent for consent  Cardiovascular:  No pedal edema Respiratory:  No wheezing, non-labored breathing GI:  Abdomen is soft and non-tender Skin:  No lesions in the area of chief complaint Neurologic:  Sensation intact distally Lymphatic:  No axillary or cervical lymphadenopathy  Orthopedic Exam:  Orthopedic examination is limited to the left hip and lower extremity.  The left lower extremity is shortened and externally rotated as compared to the right.  Skin inspection around the left hip is unremarkable.  No swelling, erythema, ecchymosis, abrasions, or other skin abnormalities are identified.  She has mild tenderness to palpation of the lateral aspect of the hip.  She has more severe pain with any attempted active or passive motion of the hip.  She is grossly neurovascularly intact to the left lower extremity and foot.  X-rays:  X-rays of the pelvis and left hip are available for review and have been reviewed by myself.  These films demonstrate a displaced left femoral neck fracture.  No significant degenerative changes of the hip joint are noted.  No lytic lesions or other acute bony processes are identified.  Assessment: Close displaced left femoral neck fracture.  Plan: The treatment options, including both surgical and nonsurgical choices, have been discussed in detail with the patient her daughter who is at the bedside.  The patient and her daughter would like to proceed with  surgical intervention to include a left hip hemiarthroplasty.  The risks (including bleeding, infection, nerve and/or blood vessel injury, persistent or recurrent pain, loosening or failure of the components, leg length inequality, dislocation, need for further surgery, blood clots, strokes, heart attacks or arrhythmias, pneumonia, etc.) and benefits of the surgical procedure were discussed.  On behalf of the patient, the patient's daughter states her understanding and agrees to proceed.  A formal written consent will be obtained by the nursing staff.  Thank you for asking me to participate in the care of this most pleasant and unfortunate woman.  I will be happy to follow her with you.   DOROTHA Reyes Maltos, MD  Beeper #:  [phone]  02/11/2024 6:30 PM

## 2024-02-11 NOTE — ED Provider Notes (Signed)
 [facility] Provider Note    Event Date/Time   First MD Initiated Contact with Patient 02/11/24 1711     (approximate)   History   Chief Complaint: Fall and Hip Pain   HPI  Anne Oneill is a 89 y.o. female with a history of dementia, hyperlipidemia, osteoporosis who was brought to the ED due to an unwitnessed fall at her [facility] memory care facility.  Patient complains of left hip pain and reports hitting her head.  She is on Eliquis .  No other reported symptomatology available from staff report to EMS.  EMS gave 100 mcg of fentanyl  total during transport.        Past Medical History:  Diagnosis Date   Arthritis    Cancer (HCC)    Dementia (HCC)    mild   DVT (deep venous thrombosis) (HCC)    High cholesterol    Hyperlipemia    Osteoporosis    Squamous cell carcinoma of skin 02/19/2007   R cheek    Current Outpatient Rx   Order #: [id number] Class: Historical Med   Order #: [id number] Class: Historical Med   Order #: [id number] Class: Print   Order #: [id number] Class: Historical Med   Order #: [id number] Class: Historical Med   Order #: [id number] Class: Historical Med   Order #: [id number] Class: Print   Order #: [id number] Class: Print   Order #: [id number] Class: Historical Med   Order #: [id number] Class: Historical Med   Order #: [id number] Class: Historical Med   Order #: [id number] Class: Normal   Order #: [id number] Class: Historical Med    Past Surgical History:  Procedure Laterality Date   ABDOMINAL HYSTERECTOMY     EYE SURGERY     FRACTURE SURGERY     MASTECTOMY Right    SKIN BIOPSY      Physical Exam   Triage Vital Signs: ED Triage Vitals  Encounter Vitals Group     BP      Girls Systolic BP Percentile      Girls Diastolic BP Percentile      Boys Systolic BP Percentile      Boys Diastolic BP Percentile      Pulse      Resp      Temp      Temp src      SpO2      Weight      Height      Head Circumference      Peak Flow       Pain Score      Pain Loc      Pain Education      Exclude from Growth Chart     Most recent vital signs: Vitals:   02/11/24 1729 02/11/24 1830  BP:  (!) 206/95  Pulse:  86  Resp:  18  Temp:    SpO2: 94% 93%    General: Awake, no distress.  CV:  Good peripheral perfusion.  Regular rate rhythm Resp:  Normal effort.  Clear lungs Abd:  No distention.  Soft nontender Other:  Left hip tenderness.  Head atraumatic.  Upper extremities unremarkable   ED Results / Procedures / Treatments   Labs (all labs ordered are listed, but only abnormal results are displayed) Labs Reviewed  COMPREHENSIVE METABOLIC PANEL WITH GFR - Abnormal; Notable for the following components:      Result Value   Glucose, Bld 126 (*)    BUN 29 (*)    All other components within normal limits  CBC WITH  DIFFERENTIAL/PLATELET  SAMPLE TO BLOOD BANK     EKG Interpreted by me Sinus rhythm rate of 80.  Normal axis, normal intervals.  Normal QRS ST segments T waves.   RADIOLOGY X-ray left hip interpreted by me, shows left femoral neck large subcapital fracture.  Radiology report reviewed  CT head unremarkable CT cervical spine shows C1 and C2 fx 1. Acute, nondisplaced type III odontoid fracture.  2. Acute nondisplaced fracture of the C1 posterior arch on the left.  3. Progressive displacement at site of a known fracture within the  C1 anterior arch (located just to the left of midline). Displacement  at this fracture site now measures 4 mm.  4. The left lateral atlantodental interval is now widened to 6 mm. A  cervical spine MRI is recommended to exclude any new ligamentous  injury.    PROCEDURES:  Procedures   MEDICATIONS ORDERED IN ED: Medications  ceFAZolin  (ANCEF ) IVPB 2g/100 mL premix (has no administration in time range)  sodium chloride  0.9 % bolus 500 mL (500 mLs Intravenous New Bag/Given 02/11/24 1752)  ondansetron  (ZOFRAN ) injection 4 mg (4 mg Intravenous Given 02/11/24 1812)   morphine  (PF) 4 MG/ML injection 4 mg (4 mg Intravenous Given 02/11/24 1813)     IMPRESSION / MDM / ASSESSMENT AND PLAN / ED COURSE  I reviewed the triage vital signs and the nursing notes.  DDx: Hip fracture, UTI, duration, AKI, electrolyte derangement, intracranial hemorrhage, C-spine fracture  Patient's presentation is most consistent with acute presentation with potential threat to life or bodily function.  Patient presents with unwitnessed fall with head trauma, left hip pain.  Will obtain x-ray CT and labs.   ----------------------------------------- 7:02 PM on 02/11/2024 ----------------------------------------- Hip fracture discussed with Ortho Dr. Edie who will plan for surgical repair tomorrow. Cervical spine injuries discussed with neurosurgery Dr. Clois who will evaluate. Case discussed with hospitalist Dr. Sherre for further management.  Patient arrived with cervical collar in place and it has been maintained.      FINAL CLINICAL IMPRESSION(S) / ED DIAGNOSES   Final diagnoses:  Closed left hip fracture, initial encounter (HCC)  Severe dementia, unspecified dementia type, unspecified whether behavioral, psychotic, or mood disturbance or anxiety (HCC)     Rx / DC Orders   ED Discharge Orders     None        Note:  This document was prepared using Dragon voice recognition software and may include unintentional dictation errors.   Viviann Pastor, MD 02/11/24 [phone]

## 2024-02-11 NOTE — Assessment & Plan Note (Signed)
Simvastatin 20 mg nightly resumed

## 2024-02-11 NOTE — Assessment & Plan Note (Addendum)
 Home spironolactone  25 mg daily, metoprolol  succinate 50 mg daily were resumed on admission Secondary to pain with multiple cervical fractures and femur fracture Hydralazine  5 mg IV every 6 hours as needed for SBP greater 170, 5 days ordered

## 2024-02-11 NOTE — Assessment & Plan Note (Addendum)
 Displaced, impacted Orthopedic service, has been consulted and states that the patient will be seen with plans for hemiarthroplasty, posterior approach for the left hip for 02/29/2024 N.p.o. after midnight Symptomatic support: Oxycodone  5 mg p.o. every 6 hours as needed for moderate pain, 1 day ordered; fentanyl  25 mcg IV every 4 hours as needed for severe pain, 1 day ordered

## 2024-02-11 NOTE — Assessment & Plan Note (Addendum)
 Neurosurgery has been consulted by EDP, Dr. Katrina states he will see the patient Continue cervical collar in place

## 2024-02-11 NOTE — ED Triage Notes (Signed)
 Pt from [facility] care via ems with reports of unwitnessed fall, hit head with no LOC. Pt on thinners. Pt has c/o left hip pain with shortening and rotation. Placed in c-collar by EMS, IV started with 100mcg of fentanyl  and 4mg  of zofran  given. Pt was 88% on RA upon arrival, placed on 4L [place].

## 2024-02-11 NOTE — Assessment & Plan Note (Signed)
Home memantine 10 mg p.o. twice daily resumed on admission

## 2024-02-11 NOTE — Consult Note (Signed)
 Consult requested by:  Dr. Viviann  Consult requested for:  C1 and C2 fractures  Primary Physician:  [facility]  History of Present Illness: 02/11/2024 Ms. Ellia Knowlton is here today with a chief complaint of neck pain and L hip pain.  She has had more than 1 fall over the past several weeks.  She was seen in July and diagnosed with a C1 fracture.  She was placed in a collar.  She was brought in today with continued neck pain as well as left hip pain and found to have a hip fracture.  She is unable to give further history.  Review of Systems:  unobtainable  Past Medical History: Past Medical History:  Diagnosis Date   Arthritis    Cancer (HCC)    Dementia (HCC)    mild   DVT (deep venous thrombosis) (HCC)    High cholesterol    Hyperlipemia    Osteoporosis    Squamous cell carcinoma of skin 02/19/2007   R cheek    Past Surgical History: Past Surgical History:  Procedure Laterality Date   ABDOMINAL HYSTERECTOMY     EYE SURGERY     FRACTURE SURGERY     MASTECTOMY Right    SKIN BIOPSY      Allergies: Allergies as of 02/11/2024 - Review Complete 02/11/2024  Allergen Reaction Noted   Codeine Nausea Only 12/11/2017   Ditropan [oxybutynin]  11/23/2019   Hydrocodone Nausea Only 12/11/2017   Morphine  Nausea Only 12/11/2017   Tramadol  Nausea Only 12/11/2017   Latex Rash 12/11/2017    Medications: Current Meds  Medication Sig   metoprolol  succinate (TOPROL -XL) 50 MG 24 hr tablet Take 50 mg by mouth daily.   nitrofurantoin, macrocrystal-monohydrate, (MACROBID) 100 MG capsule Take 100 mg by mouth daily.   ondansetron  (ZOFRAN ) 4 MG tablet Take 4 mg by mouth every 8 (eight) hours as needed.   spironolactone  (ALDACTONE ) 25 MG tablet Take 25 mg by mouth daily.   traMADol  (ULTRAM ) 50 MG tablet Take 50 mg by mouth 2 (two) times daily as needed.    Social History: Social History   Tobacco Use   Smoking status: Never   Smokeless tobacco: Never   Substance Use Topics   Alcohol use: Not Currently   Drug use: Never    Family Medical History: Family History  Problem Relation Age of Onset   Stroke Father    Heart disease Father     Physical Examination: Vitals:   02/11/24 1955 02/11/24 1959  BP:  (!) 189/89  Pulse: 92   Resp: 19   Temp:    SpO2: 94%     General: Patient is in pain due to left hip fracture.  She has recently become in medication.  She opens eyes to voice.  She is oriented to name.  She is able to speak in short sentences.  Cranial nerves intact.   Strength: MAEW LLE is mobile but cannot get full exam due to pain  Gait is untested.     Medical Decision Making  Imaging: CT C spine 02/11/2024 IMPRESSION: 1. Acute, nondisplaced type III odontoid fracture. 2. Acute nondisplaced fracture of the C1 posterior arch on the left. 3. Progressive displacement at site of a known fracture within the C1 anterior arch (located just to the left of midline). Displacement at this fracture site now measures 4 mm. 4. The left lateral atlantodental interval is now widened to 6 mm. A cervical spine MRI is recommended to exclude any  new ligamentous injury. 5. Unchanged T3 compression fracture (with 60% vertebral body height loss and 4 mm bony retropulsion. 6. Nonspecific reversal of the expected cervical lordosis. 7. Grade 1 spondylolisthesis at C3-C4, C4-C5, C7-T1 and T1-T2. 8. Nonspecific reversal of the expected cervical lordosis. 9. Mild dextrocurvature of the cervical spine. 10. Cervical spondylosis as described.     Electronically Signed   By: Rockey Childs D.O.   On: 02/11/2024 18:24    I have personally reviewed the images and agree with the above interpretation.  Assessment and Plan: Ms. Tennant is a pleasant 89 y.o. female with C1 and C2 fractures.  She has been compliant with the brace.  She has had multiple falls.  On my review of her prior imaging, I suspect that she had the C2 fracture when she  presented several weeks ago.  This appears to be a type II odontoid fracture in addition to a burst fracture of the C1 vertebral body.  Given her advanced dementia, I would not recommend surgical intervention.  I recommended that she continue her collar when out of bed.  She should remove her collar to eat and when asleep.  She will likely need her left hip pended for palliative reasons. She is clear from my standpoint to proceed with the following precautions. I would recommend she stay in her collar for intubation and throughout the case.  She can then have her collar removed in the recovery area.  I have communicated my recommendations to the requesting physician and coordinated care to facilitate these recommendations.     Jaysa Kise K. Clois MD, [facility]

## 2024-02-11 NOTE — TOC Progression Note (Signed)
 [facility] ED [facility] liaison note     This patient is a current hospice patient with [facility]. Please see media tab for detailed hospice report.    Liaison will continue to follow for any discharge planning needs and to coordinate continuation of hospice care.    Please don't hesitate to call with any Hospice related questions or concerns.    Thank you for the opportunity to participate in this patient's care.  Daphne Shed, LPN [facility] Liaison [phone]

## 2024-02-11 NOTE — H&P (Addendum)
 History and Physical   Anne Oneill FMW:[id number] DOB: May 07, 1927 DOA: 02/11/2024  PCP: [facility]  Patient coming from: [facility] via EMS  I have personally briefly reviewed patient's old medical records in [facility] EMR.  Chief Concern: Unwitnessed fall  HPI: [name] is a 89 year old female with history of mixed dementia, hyperlipidemia, osteoporosis, osteopenia, who presents emergency department for chief concerns of unwitnessed fall from [place] memory care unit.  Vitals in the ED showed temperature of 99.6, respiration rate 17, heart rate 79, blood pressure 208/98, SpO2 of 94% on 4 L nasal cannula.  Serum sodium is 137, potassium 4.4, chloride 103, bicarb 23, BUN of 29, serum creatinine of 0.77, EGFR greater than 60, nonfasting blood glucose 126, WBC 7.9, hemoglobin 13.9, platelet 211.  ED treatment: Morphine  4 mg IV one-time dose, ondansetron  4 mg IV one-time dose, sodium chloride  500 mL liter bolus.  EDP discussed case with Dr. Katrina, who states he will see the patient and family to discuss his recommendations.  EDP also consulted orthopedic surgeon, Dr. Edie will see the patient. ----------------------------------- At bedside, patient able to tell me her first name.  She was not able to tell me her age, current location, current calendar year.  Patient murmurs incoherently.   Social history: Patient lives at [place] memory unit.  ROS: Unable to complete due to patient with mixed, advanced dementia  ED Course: Discussed with EDP, patient requiring hospitalization for chief concerns of unwitnessed fall, femur fracture and multiple cervical fracture including nondisplaced type II odontoid fracture.  Assessment/Plan  Principal Problem:   Closed left femoral fracture (HCC) Active Problems:   Mixed Alzheimer's and vascular dementia (HCC)   Osteoporosis   Multiple closed fractures of cervical vertebrae (HCC)   History of  breast cancer   History of TIA (transient ischemic attack)   Hyperlipidemia   Hypertensive urgency   Thrombocytopenia (HCC)   Assessment and Plan:  * Closed left femoral fracture (HCC) Displaced, impacted Orthopedic service, has been consulted and states that the patient will be seen with plans for hemiarthroplasty, posterior approach for the left hip for 02/29/2024 N.p.o. after midnight Symptomatic support: Oxycodone  5 mg p.o. every 6 hours as needed for moderate pain, 1 day ordered; fentanyl  25 mcg IV every 4 hours as needed for severe pain, 1 day ordered  Multiple closed fractures of cervical vertebrae [facility] Neurosurgery has been consulted by EDP, Dr. Katrina states he will see the patient Continue cervical collar in place  Mixed Alzheimer's and vascular dementia (HCC) Home memantine  10 mg p.o. twice daily resumed on admission  Hypertensive urgency Home spironolactone  25 mg daily, metoprolol  succinate 50 mg daily were resumed on admission Secondary to pain with multiple cervical fractures and femur fracture Hydralazine  5 mg IV every 6 hours as needed for SBP greater 170, 5 days ordered  Hyperlipidemia Simvastatin  20 mg nightly resumed  Chart reviewed.   DVT prophylaxis: Pharmacologic DVT not initiated on admission.  AM team to initiate pharmacologic DVT when the benefits outweigh the risk.  Code Status: DNR/DNI Diet: Heart healthy; n.p.o. after midnight Family Communication: Updated daughter, Anne Oneill at bedside with patient's permission.  Daughter is patient's healthcare power of attorney and she is a Engineer, civil (consulting) with the hospital. Disposition Plan: Clinical course, guarded prognosis Consults called: Orthopedic, neurosurgery per EDP Admission status: Inpatient, telemetry cardiac  Past Medical History:  Diagnosis Date   Arthritis    Cancer (HCC)    Dementia (HCC)    mild  DVT (deep venous thrombosis) (HCC)    High cholesterol    Hyperlipemia    Osteoporosis    Squamous  cell carcinoma of skin 02/19/2007   R cheek   Past Surgical History:  Procedure Laterality Date   ABDOMINAL HYSTERECTOMY     EYE SURGERY     FRACTURE SURGERY     MASTECTOMY Right    SKIN BIOPSY     Social History:  reports that she has never smoked. She has never used smokeless tobacco. She reports that she does not currently use alcohol. She reports that she does not use drugs.  Allergies  Allergen Reactions   Codeine Nausea Only   Ditropan [Oxybutynin]    Hydrocodone Nausea Only   Morphine  Nausea Only   Tramadol  Nausea Only   Latex Rash   Family History  Problem Relation Age of Onset   Stroke Father    Heart disease Father    Family history: Family history reviewed and not pertinent.  Prior to Admission medications   Medication Sig Start Date End Date Taking? Authorizing Provider  acetaminophen  (TYLENOL ) 500 MG tablet Take 500 mg by mouth every 8 (eight) hours as needed for mild pain.    [provider]  apixaban  (ELIQUIS ) 2.5 MG TABS tablet Take 2.5 mg by mouth 2 (two) times daily.    [provider]  calcium -vitamin D  (OSCAL WITH D) 500-5 MG-MCG tablet Take 1 tablet by mouth 2 (two) times daily. 02/27/22   Anne Ade, MD  cyanocobalamin  (VITAMIN B12) 1000 MCG tablet Take 1,000 mcg by mouth daily.    [provider]  donepezil  (ARICEPT ) 10 MG tablet Take 10 mg by mouth daily.    [provider]  escitalopram  (LEXAPRO ) 20 MG tablet Take 20 mg by mouth daily.    [provider]  feeding supplement (ENSURE ENLIVE / ENSURE PLUS) LIQD Take 237 mLs by mouth 3 (three) times daily between meals. 02/27/22   Anne Ade, MD  HYDROmorphone  (DILAUDID ) 2 MG tablet Take 0.5 tablets (1 mg total) by mouth every 6 (six) hours as needed for severe pain. Patient not taking: Reported on 07/27/2022 02/27/22   Anne Ade, MD  memantine  (NAMENDA ) 10 MG tablet Take 10 mg by mouth 2 (two) times daily.    [provider]  mirabegron   ER (MYRBETRIQ ) 50 MG TB24 tablet Take 50 mg by mouth daily.    [provider]  Multiple Vitamins-Minerals (PRESERVISION AREDS 2+MULTI VIT PO) Take 1 capsule by mouth 2 (two) times daily.    [provider]  polyethylene glycol (MIRALAX  / GLYCOLAX ) 17 g packet Take 17 g by mouth daily as needed. 02/27/22   Anne Ade, MD  simvastatin  (ZOCOR ) 20 MG tablet Take 20 mg by mouth at bedtime.    [provider]   Physical Exam: Vitals:   02/11/24 1951 02/11/24 1954 02/11/24 1955 02/11/24 1959  BP:    (!) 189/89  Pulse:  93 92   Resp:  19 19   Temp:      TempSrc:      SpO2: (S) (!) 87%  94%   Weight:      Height:       Constitutional: appears frail, cachectic, weak, anxious, confused Eyes: PERRL, lids and conjunctivae normal HENMT: Mucous membranes are moist. Posterior pharynx clear of any exudate or lesions. Age-appropriate dentition. Hearing appropriate.  C-collar in place. Neck: normal, supple, no masses, no thyromegaly Respiratory: clear to auscultation bilaterally, no wheezing, no crackles. Normal respiratory  effort. No accessory muscle use.  Cardiovascular: Regular rate and rhythm, no murmurs / rubs / gallops. No extremity edema. 2+ pedal pulses. No carotid bruits.  Abdomen: no tenderness, no masses palpated, no hepatosplenomegaly. Bowel sounds positive.  Musculoskeletal: no clubbing / cyanosis. No joint deformity upper and lower extremities. Good ROM, no contractures, no atrophy. Normal muscle tone.  Decreased range of motion of the left lower extremity. Skin: no rashes, lesions, ulcers. No induration Neurologic: Sensation intact. Strength 5/5 in all 4.  Psychiatric: Lacks judgment and insight, alertness, orientation consistent with history of dementia.  N unable to assess mood.   EKG: independently reviewed, showing sinus rhythm with a rate of 80, QTc 506  Chest x-ray on Admission: I personally reviewed and I agree with radiologist reading as  below.  CT Head Wo Contrast Result Date: 02/11/2024 CLINICAL DATA:  Head trauma, minor (Age >= 59y) 89 year old post trauma. Unwitnessed fall. EXAM: CT HEAD WITHOUT CONTRAST TECHNIQUE: Contiguous axial images were obtained from the base of the skull through the vertex without intravenous contrast. RADIATION DOSE REDUCTION: This exam was performed according to the departmental dose-optimization program which includes automated exposure control, adjustment of the mA and/or kV according to patient size and/or use of iterative reconstruction technique. COMPARISON:  Most recent head CT 01/07/2024 FINDINGS: Brain: No intracranial hemorrhage, mass effect, or midline shift. Stable atrophy and chronic small vessel ischemia. No hydrocephalus. The basilar cisterns are patent. Chronic left cerebellar calcification. No evidence of territorial infarct or acute ischemia. No extra-axial or intracranial fluid collection. Vascular: Atherosclerosis of skullbase vasculature without hyperdense vessel or abnormal calcification. Skull: No fracture or focal lesion. Sinuses/Orbits: No acute finding. Other: No confluent scalp hematoma. IMPRESSION: 1. No acute intracranial abnormality. No skull fracture. 2. Stable atrophy and chronic small vessel ischemia. Electronically Signed   By: Andrea Gasman M.D.   On: 02/11/2024 18:55   DG Chest 1 View Result Date: 02/11/2024 CLINICAL DATA:  Fall and left hip pain. EXAM: DG CHEST 1V COMPARISON:  Chest radiograph dated 07/27/2022. FINDINGS: No focal consolidation, pleural effusion or pneumothorax. Mild cardiomegaly. Atherosclerotic calcification of the aorta. No acute osseous pathology. IMPRESSION: 1. No active disease. 2. Mild cardiomegaly. Electronically Signed   By: Vanetta Chou M.D.   On: 02/11/2024 18:51   DG Hip Unilat W or Wo Pelvis 2-3 Views Left Result Date: 02/11/2024 CLINICAL DATA:  Fall and left hip pain. EXAM: DG HIP (WITH OR WITHOUT PELVIS) 2-3V LEFT COMPARISON:  None  Available. FINDINGS: Mildly displaced and impacted fracture of the left femoral neck. The bones are osteopenic. Partially visualized ORIF of the right femoral neck. The soft tissues are unremarkable. IMPRESSION: Mildly displaced and impacted fracture of the left femoral neck. Electronically Signed   By: Vanetta Chou M.D.   On: 02/11/2024 18:51   CT Cervical Spine Wo Contrast Result Date: 02/11/2024 CLINICAL DATA:  Provided history: Neck trauma. Unwitnessed fall. EXAM: CT CERVICAL SPINE WITHOUT CONTRAST TECHNIQUE: Multidetector CT imaging of the cervical spine was performed without intravenous contrast. Multiplanar CT image reconstructions were also generated. RADIATION DOSE REDUCTION: This exam was performed according to the departmental dose-optimization program which includes automated exposure control, adjustment of the mA and/or kV according to patient size and/or use of iterative reconstruction technique. COMPARISON:  Cervical spine MRI 01/07/2024. Cervical spine CT 01/07/2024. FINDINGS: Alignment: Since the prior cervical spine CT of 01/07/2024, there has been progressive displacement at site of the known fracture of the C1 anterior arch (located just to the left of  midline). Displacement at this site now measures 4 mm. There is now asymmetric widening of the lateral atlantodental interval on the left (measuring 6 mm) (series 4, image 21). 3 mm C3-C4 grade 1 anterolisthesis. 4 mm C4-C5 grade 1 anterolisthesis. 2 mm C5-C6 grade 1 anterolisthesis. 2 mm C7-T1 grade 1 anterolisthesis. Slight T1-T2 grade 1 anterolisthesis. 4 mm bony retropulsion at the level of the T3 superior endplate. Nonspecific reversal of the expected cervical lordosis. Dextrocurvature of the cervical spine. Levocurvature of the upper thoracic spine. Skull base and vertebrae: New nondisplaced acute fracture of the C1 posterior arch on the left (best appreciated on series 7, images 20 in 21). New acute nondisplaced type III odontoid  fracture. Redemonstrated T3 vertebral compression fracture. Approximately 60% vertebral body height loss at this level, unchanged from the prior CT. 4 mm bony retropulsion at the level of the T3 superior endplate, also unchanged. Soft tissues and spinal canal: No prevertebral fluid or swelling. No visible canal hematoma. Disc levels: Cervical spondylosis with multilevel disc space narrowing, disc bulges/central disc protrusions, posterior disc osteophyte complexes, uncovertebral hypertrophy and facet arthropathy. Disc space narrowing is greatest at C4-C5 (moderate-to-advanced), C5-C6 (advanced) and C6-C7 (advanced). Degenerative changes also present at the C1-C2 articulation. Upper chest: No consolidation within the imaged lung apices. No visible pneumothorax. Biapical pleuroparenchymal scarring. Other: Multinodular thyroid  gland, previously assessed with thyroid  ultrasound on 01/18/2010. Please refer to this prior examination for further description. Attempts are being made to reach the ordering provider at this time. IMPRESSION: 1. Acute, nondisplaced type III odontoid fracture. 2. Acute nondisplaced fracture of the C1 posterior arch on the left. 3. Progressive displacement at site of a known fracture within the C1 anterior arch (located just to the left of midline). Displacement at this fracture site now measures 4 mm. 4. The left lateral atlantodental interval is now widened to 6 mm. A cervical spine MRI is recommended to exclude any new ligamentous injury. 5. Unchanged T3 compression fracture (with 60% vertebral body height loss and 4 mm bony retropulsion. 6. Nonspecific reversal of the expected cervical lordosis. 7. Grade 1 spondylolisthesis at C3-C4, C4-C5, C7-T1 and T1-T2. 8. Nonspecific reversal of the expected cervical lordosis. 9. Mild dextrocurvature of the cervical spine. 10. Cervical spondylosis as described. Electronically Signed   By: Rockey Childs D.O.   On: 02/11/2024 18:24   Labs on Admission: I  have personally reviewed following labs  CBC: Recent Labs  Lab 02/11/24 1719  WBC 7.9  NEUTROABS 5.6  HGB 13.9  HCT 41.9  MCV 92.1  PLT 211   Basic Metabolic Panel: Recent Labs  Lab 02/11/24 1719  NA 137  K 4.4  CL 103  CO2 23  GLUCOSE 126*  BUN 29*  CREATININE 0.77  CALCIUM  9.3   GFR: Estimated Creatinine Clearance: 28.6 mL/min (by C-G formula based on SCr of 0.77 mg/dL).  Liver Function Tests: Recent Labs  Lab 02/11/24 1719  AST 22  ALT 19  ALKPHOS 71  BILITOT 0.9  PROT 7.1  ALBUMIN 3.8   Urine analysis:    Component Value Date/Time   COLORURINE STRAW (A) 01/07/2024 1512   APPEARANCEUR CLEAR (A) 01/07/2024 1512   LABSPEC 1.006 01/07/2024 1512   PHURINE 7.0 01/07/2024 1512   GLUCOSEU NEGATIVE 01/07/2024 1512   HGBUR NEGATIVE 01/07/2024 1512   BILIRUBINUR NEGATIVE 01/07/2024 1512   KETONESUR NEGATIVE 01/07/2024 1512   PROTEINUR NEGATIVE 01/07/2024 1512   NITRITE NEGATIVE 01/07/2024 1512   LEUKOCYTESUR NEGATIVE 01/07/2024 1512   This document  was prepared using Conservation officer, historic buildings and may include unintentional dictation errors.  Dr. Sherre [facility]  If 7PM-7AM, please contact overnight-coverage provider If 7AM-7PM, please contact day attending provider [email or web address]  02/11/2024, 8:18 PM

## 2024-02-11 NOTE — Hospital Course (Addendum)
 Anne Oneill is a 89 year old female with history of mixed dementia, hyperlipidemia, osteoporosis, osteopenia, who presents emergency department for chief concerns of unwitnessed fall from [place] memory care unit.  Vitals in the ED showed temperature of 99.6, respiration rate 17, heart rate 79, blood pressure 208/98, SpO2 of 94% on 4 L nasal cannula.  Serum sodium is 137, potassium 4.4, chloride 103, bicarb 23, BUN of 29, serum creatinine of 0.77, EGFR greater than 60, nonfasting blood glucose 126, WBC 7.9, hemoglobin 13.9, platelet 211.  ED treatment: Morphine  4 mg IV one-time dose, ondansetron  4 mg IV one-time dose, sodium chloride  500 mL liter bolus.  EDP discussed case with Dr. Katrina, who states he will see the patient and family to discuss his recommendations.  EDP also consulted orthopedic surgeon, Dr. Edie will see the patient.

## 2024-02-12 ENCOUNTER — Inpatient Hospital Stay

## 2024-02-12 ENCOUNTER — Encounter: Payer: Self-pay | Admitting: Internal Medicine

## 2024-02-12 ENCOUNTER — Inpatient Hospital Stay: Admitting: Certified Registered"

## 2024-02-12 ENCOUNTER — Other Ambulatory Visit: Payer: Self-pay

## 2024-02-12 ENCOUNTER — Encounter
Admission: EM | Disposition: A | Discharge status: Skilled Nursing Facility | Payer: Self-pay | Source: Skilled Nursing Facility | Attending: Student

## 2024-02-12 DIAGNOSIS — M25552 Pain in left hip: Secondary | ICD-10-CM | POA: Diagnosis not present

## 2024-02-12 DIAGNOSIS — S72042A Displaced fracture of base of neck of left femur, initial encounter for closed fracture: Secondary | ICD-10-CM

## 2024-02-12 DIAGNOSIS — S72002A Fracture of unspecified part of neck of left femur, initial encounter for closed fracture: Secondary | ICD-10-CM | POA: Diagnosis not present

## 2024-02-12 HISTORY — PX: HIP ARTHROPLASTY: SHX981

## 2024-02-12 LAB — CBC
HCT: 40.4 % (ref 36.0–46.0)
Hemoglobin: 13.2 g/dL (ref 12.0–15.0)
MCH: 29.7 pg (ref 26.0–34.0)
MCHC: 32.7 g/dL (ref 30.0–36.0)
MCV: 91 fL (ref 80.0–100.0)
Platelets: 202 K/uL (ref 150–400)
RBC: 4.44 MIL/uL (ref 3.87–5.11)
RDW: 13 % (ref 11.5–15.5)
WBC: 10.1 K/uL (ref 4.0–10.5)
nRBC: 0 % (ref 0.0–0.2)

## 2024-02-12 LAB — BASIC METABOLIC PANEL WITH GFR
Anion gap: 10 (ref 5–15)
BUN: 20 mg/dL (ref 8–23)
CO2: 22 mmol/L (ref 22–32)
Calcium: 8.6 mg/dL — ABNORMAL LOW (ref 8.9–10.3)
Chloride: 102 mmol/L (ref 98–111)
Creatinine, Ser: 0.68 mg/dL (ref 0.44–1.00)
GFR, Estimated: >60 mL/min (ref >60)
Glucose, Bld: 130 mg/dL — ABNORMAL HIGH (ref 70–99)
Potassium: 3.9 mmol/L (ref 3.5–5.1)
Sodium: 134 mmol/L — ABNORMAL LOW (ref 135–145)

## 2024-02-12 LAB — VITAMIN D 25 HYDROXY (VIT D DEFICIENCY, FRACTURES): Vit D, 25-Hydroxy: 34.69 ng/mL (ref 30–100)

## 2024-02-12 LAB — MAGNESIUM: Magnesium: 1.7 mg/dL (ref 1.7–2.4)

## 2024-02-12 LAB — PHOSPHORUS: Phosphorus: 2.9 mg/dL (ref 2.5–4.6)

## 2024-02-12 LAB — VITAMIN B12: Vitamin B-12: 387 pg/mL (ref 180–914)

## 2024-02-12 SURGERY — HEMIARTHROPLASTY (BIPOLAR) HIP, POSTERIOR APPROACH FOR FRACTURE
Anesthesia: General | Site: Hip | Laterality: Left

## 2024-02-12 MED ORDER — HYDRALAZINE HCL 20 MG/ML IJ SOLN
10.0000 mg | Freq: Four times a day (QID) | INTRAMUSCULAR | Status: DC | PRN
  Administered 2024-02-13 – 2024-02-15 (×2): 10 mg via INTRAVENOUS
  Filled 2024-02-12 (×2): qty 1

## 2024-02-12 MED ORDER — SODIUM CHLORIDE (PF) 0.9 % IJ SOLN
INTRAMUSCULAR | Status: AC
  Filled 2024-02-12: qty 40

## 2024-02-12 MED ORDER — PROPOFOL 10 MG/ML IV BOLUS
INTRAVENOUS | Status: DC | PRN
  Administered 2024-02-12: 60 mg via INTRAVENOUS

## 2024-02-12 MED ORDER — SODIUM CHLORIDE 0.9 % IR SOLN
Status: DC | PRN
  Administered 2024-02-12: 3000 mL

## 2024-02-12 MED ORDER — FENTANYL CITRATE (PF) 100 MCG/2ML IJ SOLN
INTRAMUSCULAR | Status: AC
  Filled 2024-02-12: qty 2

## 2024-02-12 MED ORDER — FENTANYL CITRATE PF 50 MCG/ML IJ SOSY: 25.0000 ug | PREFILLED_SYRINGE | INTRAMUSCULAR | Status: DC | PRN

## 2024-02-12 MED ORDER — BUPIVACAINE LIPOSOME 1.3 % IJ SUSP
INTRAMUSCULAR | Status: AC
  Filled 2024-02-12: qty 20

## 2024-02-12 MED ORDER — TRIAMCINOLONE ACETONIDE 40 MG/ML IJ SUSP
INTRAMUSCULAR | Status: AC
  Filled 2024-02-12: qty 2

## 2024-02-12 MED ORDER — 0.9 % SODIUM CHLORIDE (POUR BTL) OPTIME
TOPICAL | Status: DC | PRN
  Administered 2024-02-12: 1000 mL

## 2024-02-12 MED ORDER — HYDROMORPHONE HCL 1 MG/ML IJ SOLN
INTRAMUSCULAR | Status: AC
  Filled 2024-02-12: qty 1

## 2024-02-12 MED ORDER — OXYCODONE HCL 5 MG PO TABS: 5.0000 mg | ORAL_TABLET | Freq: Once | ORAL | Status: DC | PRN

## 2024-02-12 MED ORDER — ONDANSETRON HCL 4 MG/2ML IJ SOLN
4.0000 mg | Freq: Four times a day (QID) | INTRAMUSCULAR | Status: DC | PRN
  Administered 2024-02-15: 4 mg via INTRAVENOUS
  Filled 2024-02-12: qty 2

## 2024-02-12 MED ORDER — LACTATED RINGERS IV SOLN: INTRAVENOUS | Status: DC | PRN

## 2024-02-12 MED ORDER — SODIUM CHLORIDE 0.9 % IV SOLN
INTRAVENOUS | Status: DC | PRN
  Administered 2024-02-12: 101 mL via TOPICAL

## 2024-02-12 MED ORDER — TRANEXAMIC ACID-NACL 1000-0.7 MG/100ML-% IV SOLN
INTRAVENOUS | Status: DC | PRN
  Administered 2024-02-12: 1000 mg via INTRAVENOUS

## 2024-02-12 MED ORDER — SUGAMMADEX SODIUM 200 MG/2ML IV SOLN
INTRAVENOUS | Status: DC | PRN
  Administered 2024-02-12: 100 mg via INTRAVENOUS

## 2024-02-12 MED ORDER — FENTANYL CITRATE (PF) 100 MCG/2ML IJ SOLN
INTRAMUSCULAR | Status: DC | PRN
  Administered 2024-02-12: 100 ug via INTRAVENOUS

## 2024-02-12 MED ORDER — METOPROLOL TARTRATE 5 MG/5ML IV SOLN
INTRAVENOUS | Status: DC | PRN
  Administered 2024-02-12: 1 mg via INTRAVENOUS
  Administered 2024-02-12: 2 mg via INTRAVENOUS

## 2024-02-12 MED ORDER — TRIAMCINOLONE ACETONIDE 40 MG/ML IJ SUSP
INTRAMUSCULAR | Status: DC | PRN
Start: 2024-02-12 — End: 2024-02-12
  Administered 2024-02-12: 80 mg via INTRAMUSCULAR

## 2024-02-12 MED ORDER — SUCCINYLCHOLINE CHLORIDE 200 MG/10ML IV SOSY
PREFILLED_SYRINGE | INTRAVENOUS | Status: DC | PRN
  Administered 2024-02-12: 40 mg via INTRAVENOUS

## 2024-02-12 MED ORDER — DEXAMETHASONE SODIUM PHOSPHATE 10 MG/ML IJ SOLN
INTRAMUSCULAR | Status: DC | PRN
Start: 2024-02-12 — End: 2024-02-12
  Administered 2024-02-12: 5 mg via INTRAVENOUS

## 2024-02-12 MED ORDER — ACETAMINOPHEN 325 MG PO TABS
650.0000 mg | ORAL_TABLET | Freq: Four times a day (QID) | ORAL | Status: DC | PRN
  Administered 2024-02-14 – 2024-02-17 (×3): 650 mg via ORAL
  Filled 2024-02-12 (×3): qty 2

## 2024-02-12 MED ORDER — ROCURONIUM BROMIDE 100 MG/10ML IV SOLN
INTRAVENOUS | Status: DC | PRN
  Administered 2024-02-12: 20 mg via INTRAVENOUS

## 2024-02-12 MED ORDER — PHENYLEPHRINE HCL (PRESSORS) 10 MG/ML IV SOLN
INTRAVENOUS | Status: AC
Start: 2024-02-12 — End: 2024-02-12
  Filled 2024-02-12: qty 1

## 2024-02-12 MED ORDER — FENTANYL CITRATE (PF) 100 MCG/2ML IJ SOLN: 25.0000 ug | INTRAMUSCULAR | Status: DC | PRN

## 2024-02-12 MED ORDER — TRANEXAMIC ACID-NACL 1000-0.7 MG/100ML-% IV SOLN
INTRAVENOUS | Status: AC
Start: 2024-02-12 — End: 2024-02-12
  Filled 2024-02-12: qty 100

## 2024-02-12 MED ORDER — ACETAMINOPHEN 10 MG/ML IV SOLN
INTRAVENOUS | Status: AC
  Filled 2024-02-12: qty 100

## 2024-02-12 MED ORDER — KETOROLAC TROMETHAMINE 30 MG/ML IJ SOLN
INTRAMUSCULAR | Status: DC | PRN
  Administered 2024-02-12: 30 mg via INTRAMUSCULAR

## 2024-02-12 MED ORDER — CEFAZOLIN SODIUM-DEXTROSE 2-4 GM/100ML-% IV SOLN
INTRAVENOUS | Status: AC
  Filled 2024-02-12: qty 100

## 2024-02-12 MED ORDER — LABETALOL HCL 5 MG/ML IV SOLN: 10.0000 mg | INTRAVENOUS | Status: DC | PRN

## 2024-02-12 MED ORDER — CALCIUM CHLORIDE 10 % IV SOLN
INTRAVENOUS | Status: DC | PRN
  Administered 2024-02-12 (×2): .5 g via INTRAVENOUS

## 2024-02-12 MED ORDER — ONDANSETRON HCL 4 MG PO TABS: 4.0000 mg | ORAL_TABLET | Freq: Four times a day (QID) | ORAL | Status: DC | PRN

## 2024-02-12 MED ORDER — OXYCODONE HCL 5 MG/5ML PO SOLN: 5.0000 mg | Freq: Once | ORAL | Status: DC | PRN

## 2024-02-12 MED ORDER — PHENYLEPHRINE HCL-NACL 20-0.9 MG/250ML-% IV SOLN
INTRAVENOUS | Status: DC | PRN
  Administered 2024-02-12: 160 ug via INTRAVENOUS
  Administered 2024-02-12: 53.333 ug/min via INTRAVENOUS
  Administered 2024-02-12 (×2): 160 ug via INTRAVENOUS
  Administered 2024-02-12: 80 ug via INTRAVENOUS

## 2024-02-12 MED ORDER — ACETAMINOPHEN 10 MG/ML IV SOLN
INTRAVENOUS | Status: DC | PRN
  Administered 2024-02-12: 650 mg via INTRAVENOUS

## 2024-02-12 MED ORDER — PHENYLEPHRINE 80 MCG/ML (10ML) SYRINGE FOR IV PUSH (FOR BLOOD PRESSURE SUPPORT)
PREFILLED_SYRINGE | INTRAVENOUS | Status: DC | PRN
  Administered 2024-02-12: 160 ug via INTRAVENOUS

## 2024-02-12 MED ORDER — PHENYLEPHRINE HCL-NACL 20-0.9 MG/250ML-% IV SOLN
INTRAVENOUS | Status: AC
  Filled 2024-02-12: qty 250

## 2024-02-12 MED ORDER — BUPIVACAINE-EPINEPHRINE (PF) 0.5% -1:200000 IJ SOLN
INTRAMUSCULAR | Status: DC | PRN
  Administered 2024-02-12: 30 mL

## 2024-02-12 MED ORDER — BUPIVACAINE LIPOSOME 1.3 % IJ SUSP
INTRAMUSCULAR | Status: DC | PRN
  Administered 2024-02-12: 20 mL

## 2024-02-12 MED ORDER — BUPIVACAINE-EPINEPHRINE (PF) 0.5% -1:200000 IJ SOLN
INTRAMUSCULAR | Status: AC
Start: 2024-02-12 — End: 2024-02-12
  Filled 2024-02-12: qty 30

## 2024-02-12 MED ORDER — LIDOCAINE HCL (CARDIAC) PF 100 MG/5ML IV SOSY
PREFILLED_SYRINGE | INTRAVENOUS | Status: DC | PRN
  Administered 2024-02-12: 40 mg via INTRAVENOUS

## 2024-02-12 MED ORDER — ACETAMINOPHEN 650 MG RE SUPP: 650.0000 mg | Freq: Four times a day (QID) | RECTAL | Status: DC | PRN

## 2024-02-12 MED ORDER — HYDROMORPHONE HCL 1 MG/ML IJ SOLN
INTRAMUSCULAR | Status: DC | PRN
  Administered 2024-02-12 (×2): .5 mg via INTRAVENOUS

## 2024-02-12 MED ORDER — CALCIUM CHLORIDE 10 % IV SOLN
INTRAVENOUS | Status: AC
Start: 2024-02-12 — End: 2024-02-12
  Filled 2024-02-12: qty 10

## 2024-02-12 MED ORDER — ENOXAPARIN SODIUM 30 MG/0.3ML IJ SOSY
30.0000 mg | PREFILLED_SYRINGE | Freq: Every evening | INTRAMUSCULAR | Status: DC
  Administered 2024-02-13 – 2024-02-17 (×5): 30 mg via SUBCUTANEOUS
  Filled 2024-02-12 (×5): qty 0.3

## 2024-02-12 MED ORDER — OXYCODONE HCL 5 MG PO TABS
5.0000 mg | ORAL_TABLET | Freq: Four times a day (QID) | ORAL | Status: DC | PRN
  Administered 2024-02-13 – 2024-02-15 (×3): 5 mg via ORAL
  Filled 2024-02-12 (×3): qty 1

## 2024-02-12 MED ORDER — SODIUM CHLORIDE 0.9% FLUSH
INTRAVENOUS | Status: DC | PRN
  Administered 2024-02-12: 40 mL via TOPICAL

## 2024-02-12 MED ORDER — SODIUM CHLORIDE 0.9 % IV SOLN: INTRAVENOUS | Status: AC

## 2024-02-12 MED ORDER — HYDRALAZINE HCL 20 MG/ML IJ SOLN
10.0000 mg | Freq: Four times a day (QID) | INTRAMUSCULAR | Status: DC | PRN
Start: 2024-02-12 — End: 2024-02-12

## 2024-02-12 SURGICAL SUPPLY — 59 items
BAG DECANTER FOR FLEXI CONT (MISCELLANEOUS) IMPLANT
BIT DRILL JC 5IN 2.0M 127 23FL (BIT) IMPLANT
BLADE SAGITTAL WIDE XTHICK NO (BLADE) ×1 IMPLANT
BLADE SURG SZ20 CARB STEEL (BLADE) ×1 IMPLANT
BOWL CEMENT MIXING ADV NOZZLE (MISCELLANEOUS) IMPLANT
CEMENT BONE 40GM (Cement) IMPLANT
CENTRALIZER STM 11XPSTNR STRL (Orthopedic Implant) IMPLANT
CHLORAPREP W/TINT 26 (MISCELLANEOUS) ×2 IMPLANT
CUP ARTIC RINGLOC HIP 28X44 (Orthopedic Implant) IMPLANT
DRAPE IMP U-DRAPE 54X76 (DRAPES) ×1 IMPLANT
DRAPE INCISE IOBAN 66X60 STRL (DRAPES) ×1 IMPLANT
DRAPE SURG 17X11 SM STRL (DRAPES) ×1 IMPLANT
DRAPE SURG 17X23 STRL (DRAPES) ×1 IMPLANT
DRSG MEPILEX SACRM 8.7X9.8 (GAUZE/BANDAGES/DRESSINGS) IMPLANT
DRSG OPSITE POSTOP 4X12 (GAUZE/BANDAGES/DRESSINGS) IMPLANT
DRSG OPSITE POSTOP 4X8 (GAUZE/BANDAGES/DRESSINGS) IMPLANT
ELECT BLADE 6.5 EXT (BLADE) IMPLANT
ELECT CAUTERY BLADE 6.4 (BLADE) ×1 IMPLANT
ELECTRODE REM PT RTRN 9FT ADLT (ELECTROSURGICAL) ×1 IMPLANT
GAUZE PACK 2X3YD (PACKING) IMPLANT
GAUZE XEROFORM 1X8 LF (GAUZE/BANDAGES/DRESSINGS) ×1 IMPLANT
GLOVE BIO SURGEON STRL SZ7.5 (GLOVE) IMPLANT
GLOVE BIO SURGEON STRL SZ8 (GLOVE) ×3 IMPLANT
GLOVE BIOGEL PI IND STRL 8 (GLOVE) ×1 IMPLANT
GOWN STRL REUS W/ TWL LRG LVL3 (GOWN DISPOSABLE) ×1 IMPLANT
GOWN STRL REUS W/ TWL XL LVL3 (GOWN DISPOSABLE) ×1 IMPLANT
HEAD MOD COCR 28MM HD -6MM NK (Orthopedic Implant) IMPLANT
HOLDER FOLEY CATH W/STRAP (MISCELLANEOUS) IMPLANT
HOOD PEEL AWAY T7 (MISCELLANEOUS) ×3 IMPLANT
IV NS 100ML SINGLE PACK (IV SOLUTION) IMPLANT
KIT PREP HIP W/CEMENT RESTRICT (Miscellaneous) IMPLANT
LABEL OR SOLS (LABEL) ×1 IMPLANT
MANIFOLD NEPTUNE II (INSTRUMENTS) ×1 IMPLANT
NDL FILTER BLUNT 18X1 1/2 (NEEDLE) ×1 IMPLANT
NDL SAFETY ECLIPSE 18X1.5 (NEEDLE) ×1 IMPLANT
NDL SPNL 20GX3.5 QUINCKE YW (NEEDLE) ×1 IMPLANT
NEEDLE FILTER BLUNT 18X1 1/2 (NEEDLE) ×1 IMPLANT
NEEDLE SPNL 20GX3.5 QUINCKE YW (NEEDLE) ×1 IMPLANT
NS IRRIG 500ML POUR BTL (IV SOLUTION) ×1 IMPLANT
PACK HIP PROSTHESIS (MISCELLANEOUS) ×1 IMPLANT
PIN STEIN SMOOTH 3/16X9 (Pin) ×1 IMPLANT
SOL .9 NS 3000ML IRR UROMATIC (IV SOLUTION) ×2 IMPLANT
SPIKE FLUID TRANSFER (MISCELLANEOUS) ×2 IMPLANT
SPONGE T-LAP 18X18 ~~LOC~~+RFID (SPONGE) ×2 IMPLANT
STAPLER SKIN PROX 35W (STAPLE) ×1 IMPLANT
STEM HIP FEMORAL 9MMX130MM (Stem) IMPLANT
STRAP SAFETY 5IN WIDE (MISCELLANEOUS) ×1 IMPLANT
SUT TICRON 2-0 30IN 311381 (SUTURE) ×4 IMPLANT
SUT VIC AB 0 CT1 36 (SUTURE) ×1 IMPLANT
SUT VIC AB 1 CT1 36 (SUTURE) ×1 IMPLANT
SUT VIC AB 2-0 CT1 (SUTURE) ×2 IMPLANT
SYR 10ML LL (SYRINGE) ×1 IMPLANT
SYR 30ML LL (SYRINGE) ×3 IMPLANT
SYR TB 1ML LL NO SAFETY (SYRINGE) IMPLANT
TIP BRUSH PULSAVAC PLUS 24.33 (MISCELLANEOUS) IMPLANT
TIP FAN IRRIG PULSAVAC PLUS (DISPOSABLE) ×1 IMPLANT
TRAP FLUID SMOKE EVACUATOR (MISCELLANEOUS) ×2 IMPLANT
TRAY FOL W/BAG SLVR 16FR STRL (SET/KITS/TRAYS/PACK) IMPLANT
WATER STERILE IRR 1000ML POUR (IV SOLUTION) ×1 IMPLANT

## 2024-02-12 NOTE — Anesthesia Procedure Notes (Addendum)
 Procedure Name: Intubation Date/Time: 02/12/2024 2:45 PM  Performed by: Norleen Alberta HERO., CRNAPre-anesthesia Checklist: Patient identified, Patient being monitored, Timeout performed, Emergency Drugs available and Suction available Patient Re-evaluated:Patient Re-evaluated prior to induction Oxygen Delivery Method: Circle system utilized Preoxygenation: Pre-oxygenation with 100% oxygen Induction Type: IV induction and Rapid sequence Laryngoscope Size: 3 and McGrath (X3 blade) Grade View: Grade I Tube type: Oral Tube size: 6.0 mm Number of attempts: 1 Airway Equipment and Method: Stylet Placement Confirmation: ETT inserted through vocal cords under direct vision, positive ETCO2 and breath sounds checked- equal and bilateral Secured at: 18 cm Tube secured with: Tape Dental Injury: Teeth and Oropharynx as per pre-operative assessment  Comments: C-collar intact during intubation

## 2024-02-12 NOTE — Progress Notes (Signed)
 [facility] Progress Note  Patient: Anne Oneill    FMW:[id number]  DOA: 02/11/2024     Date of Service: the patient was seen and examined on 02/12/2024  Chief Complaint  Patient presents with   Fall   Hip Pain   Brief hospital course: Ms. Norene Oliveri is a 89 year old female with history of mixed dementia, hyperlipidemia, osteoporosis, osteopenia, who presents emergency department for chief concerns of unwitnessed fall from [facility] memory care unit.   Vitals in the ED showed temperature of 99.6, respiration rate 17, heart rate 79, blood pressure 208/98, SpO2 of 94% on 4 L nasal cannula.   Serum sodium is 137, potassium 4.4, chloride 103, bicarb 23, BUN of 29, serum creatinine of 0.77, EGFR greater than 60, nonfasting blood glucose 126, WBC 7.9, hemoglobin 13.9, platelet 211.   ED treatment: Morphine  4 mg IV one-time dose, ondansetron  4 mg IV one-time dose, sodium chloride  500 mL liter bolus  Orthopedic surgery and neurosurgery consulted. TRH consulted for admission and further management as appropriate   Assessment and Plan:   # Left femoral fracture, closed due to fall Continue as needed meds for pain control Seen by orthopedic surgery, scheduled for left hip hemiarthroplasty today Keep n.p.o.  # Multiple closed fractures of cervical vertebrae  Seen by neurosurgery, recommended no surgical intervention, continue cervical collar remains out of bed. She should remove her collar to eat and when asleep.  She is cleared for hip surgery, recommended she stay in her collar for intubation and throughout the case.  She can then have her collar removed in the recovery area.   Mixed Alzheimer's and vascular dementia Home memantine  10 mg p.o. twice daily resumed on admission   Hypertensive urgency Home spironolactone  25 mg daily, metoprolol  succinate 50 mg daily were resumed on admission Secondary to pain with multiple cervical fractures and femur fracture Hydralazine  10  mg IV every 6 hr prn SBP >170   Hyperlipidemia Simvastatin  20 mg nightly resumed   Body mass index is 18.51 kg/m.  Interventions:  Diet: NPO DVT Prophylaxis: Subcutaneous Lovenox    Advance goals of care discussion: DNR-Limited  Family Communication: Family was present at bedside, at the time of interview.  The pt provided permission to discuss medical plan with the family. Opportunity was given to ask question and all questions were answered satisfactorily.   Disposition:  Pt is from [facility] under [facility] care, admitted with C-spine abd Left femoral fx, scheduled for left hip hemiarthroplasty on 9/3, which precludes a safe discharge. Discharge back the facility, when stable and cleared by [facility].  Subjective: No significant events overnight.  Patient was resting comfortably.  Patient is significant dementia, unable to offer any complaints.    Physical Exam: General: NAD, lying comfortably Appear in no distress, affect appropriate Eyes: PERRLA ENT: Neck collar intact Neck: no JVD,  Cardiovascular: S1 and S2 Present, no Murmur,  Respiratory: good respiratory effort, Bilateral Air entry equal and Decreased, no Crackles, no wheezes Abdomen: Bowel Sound present, Soft and no tenderness,  Skin: no rashes Extremities: Left femur tenderness, no significant Pedal edema Neurologic: without any new focal findings Gait not checked due to patient safety concerns  Vitals:   02/12/24 0500 02/12/24 0530 02/12/24 0600 02/12/24 0700  BP: (!) 182/81 (!) 159/75 (!) 155/77 (!) 152/76  Pulse: 97 (!) 103 100 97  Resp: 17 17 19  (!) 25  Temp:      TempSrc:      SpO2: 95% 95% 95% 95%  Weight:      Height:       No intake or output data in the 24 hours ending 02/12/24 0838 Filed Weights   02/11/24 1716  Weight: 43 kg    Data Reviewed: I have personally reviewed and interpreted daily labs, tele strips, imagings as discussed above. I reviewed all nursing notes,  pharmacy notes, vitals, pertinent old records I have discussed plan of care as described above with RN and patient/family.  CBC: Recent Labs  Lab 02/11/24 1719 02/12/24 0349  WBC 7.9 10.1  NEUTROABS 5.6  --   HGB 13.9 13.2  HCT 41.9 40.4  MCV 92.1 91.0  PLT 211 202   Basic Metabolic Panel: Recent Labs  Lab 02/11/24 1719 02/12/24 0349  NA 137 134*  K 4.4 3.9  CL 103 102  CO2 23 22  GLUCOSE 126* 130*  BUN 29* 20  CREATININE 0.77 0.68  CALCIUM  9.3 8.6*    Studies: CT Head Wo Contrast Result Date: 02/11/2024 CLINICAL DATA:  Head trauma, minor (Age >= 26y) 89 year old post trauma. Unwitnessed fall. EXAM: CT HEAD WITHOUT CONTRAST TECHNIQUE: Contiguous axial images were obtained from the base of the skull through the vertex without intravenous contrast. RADIATION DOSE REDUCTION: This exam was performed according to the departmental dose-optimization program which includes automated exposure control, adjustment of the mA and/or kV according to patient size and/or use of iterative reconstruction technique. COMPARISON:  Most recent head CT 01/07/2024 FINDINGS: Brain: No intracranial hemorrhage, mass effect, or midline shift. Stable atrophy and chronic small vessel ischemia. No hydrocephalus. The basilar cisterns are patent. Chronic left cerebellar calcification. No evidence of territorial infarct or acute ischemia. No extra-axial or intracranial fluid collection. Vascular: Atherosclerosis of skullbase vasculature without hyperdense vessel or abnormal calcification. Skull: No fracture or focal lesion. Sinuses/Orbits: No acute finding. Other: No confluent scalp hematoma. IMPRESSION: 1. No acute intracranial abnormality. No skull fracture. 2. Stable atrophy and chronic small vessel ischemia. Electronically Signed   By: Andrea Gasman M.D.   On: 02/11/2024 18:55   DG Chest 1 View Result Date: 02/11/2024 CLINICAL DATA:  Fall and left hip pain. EXAM: DG CHEST 1V COMPARISON:  Chest radiograph dated  07/27/2022. FINDINGS: No focal consolidation, pleural effusion or pneumothorax. Mild cardiomegaly. Atherosclerotic calcification of the aorta. No acute osseous pathology. IMPRESSION: 1. No active disease. 2. Mild cardiomegaly. Electronically Signed   By: Vanetta Chou M.D.   On: 02/11/2024 18:51   DG Hip Unilat W or Wo Pelvis 2-3 Views Left Result Date: 02/11/2024 CLINICAL DATA:  Fall and left hip pain. EXAM: DG HIP (WITH OR WITHOUT PELVIS) 2-3V LEFT COMPARISON:  None Available. FINDINGS: Mildly displaced and impacted fracture of the left femoral neck. The bones are osteopenic. Partially visualized ORIF of the right femoral neck. The soft tissues are unremarkable. IMPRESSION: Mildly displaced and impacted fracture of the left femoral neck. Electronically Signed   By: Vanetta Chou M.D.   On: 02/11/2024 18:51   CT Cervical Spine Wo Contrast Result Date: 02/11/2024 CLINICAL DATA:  Provided history: Neck trauma. Unwitnessed fall. EXAM: CT CERVICAL SPINE WITHOUT CONTRAST TECHNIQUE: Multidetector CT imaging of the cervical spine was performed without intravenous contrast. Multiplanar CT image reconstructions were also generated. RADIATION DOSE REDUCTION: This exam was performed according to the departmental dose-optimization program which includes automated exposure control, adjustment of the mA and/or kV according to patient size and/or use of iterative reconstruction technique. COMPARISON:  Cervical spine MRI 01/07/2024. Cervical spine CT 01/07/2024. FINDINGS: Alignment: Since the prior  cervical spine CT of 01/07/2024, there has been progressive displacement at site of the known fracture of the C1 anterior arch (located just to the left of midline). Displacement at this site now measures 4 mm. There is now asymmetric widening of the lateral atlantodental interval on the left (measuring 6 mm) (series 4, image 21). 3 mm C3-C4 grade 1 anterolisthesis. 4 mm C4-C5 grade 1 anterolisthesis. 2 mm C5-C6 grade 1  anterolisthesis. 2 mm C7-T1 grade 1 anterolisthesis. Slight T1-T2 grade 1 anterolisthesis. 4 mm bony retropulsion at the level of the T3 superior endplate. Nonspecific reversal of the expected cervical lordosis. Dextrocurvature of the cervical spine. Levocurvature of the upper thoracic spine. Skull base and vertebrae: New nondisplaced acute fracture of the C1 posterior arch on the left (best appreciated on series 7, images 20 in 21). New acute nondisplaced type III odontoid fracture. Redemonstrated T3 vertebral compression fracture. Approximately 60% vertebral body height loss at this level, unchanged from the prior CT. 4 mm bony retropulsion at the level of the T3 superior endplate, also unchanged. Soft tissues and spinal canal: No prevertebral fluid or swelling. No visible canal hematoma. Disc levels: Cervical spondylosis with multilevel disc space narrowing, disc bulges/central disc protrusions, posterior disc osteophyte complexes, uncovertebral hypertrophy and facet arthropathy. Disc space narrowing is greatest at C4-C5 (moderate-to-advanced), C5-C6 (advanced) and C6-C7 (advanced). Degenerative changes also present at the C1-C2 articulation. Upper chest: No consolidation within the imaged lung apices. No visible pneumothorax. Biapical pleuroparenchymal scarring. Other: Multinodular thyroid  gland, previously assessed with thyroid  ultrasound on 01/18/2010. Please refer to this prior examination for further description. Attempts are being made to reach the ordering provider at this time. IMPRESSION: 1. Acute, nondisplaced type III odontoid fracture. 2. Acute nondisplaced fracture of the C1 posterior arch on the left. 3. Progressive displacement at site of a known fracture within the C1 anterior arch (located just to the left of midline). Displacement at this fracture site now measures 4 mm. 4. The left lateral atlantodental interval is now widened to 6 mm. A cervical spine MRI is recommended to exclude any new  ligamentous injury. 5. Unchanged T3 compression fracture (with 60% vertebral body height loss and 4 mm bony retropulsion. 6. Nonspecific reversal of the expected cervical lordosis. 7. Grade 1 spondylolisthesis at C3-C4, C4-C5, C7-T1 and T1-T2. 8. Nonspecific reversal of the expected cervical lordosis. 9. Mild dextrocurvature of the cervical spine. 10. Cervical spondylosis as described. Electronically Signed   By: Rockey Childs D.O.   On: 02/11/2024 18:24    Scheduled Meds:  cyanocobalamin   1,000 mcg Oral Daily   escitalopram   10 mg Oral Daily   metoprolol  succinate  50 mg Oral Daily   simvastatin   20 mg Oral QHS   spironolactone   25 mg Oral Daily   Continuous Infusions:  sodium chloride       ceFAZolin  (ANCEF ) IV     PRN Meds: acetaminophen  **OR** acetaminophen , fentaNYL  (SUBLIMAZE ) injection, hydrALAZINE , ondansetron  **OR** ondansetron  (ZOFRAN ) IV, oxyCODONE   Time spent: 55 minutes  Author: ELVAN SOR. MD [facility] 02/12/2024 8:38 AM  To reach On-call, see care teams to locate the attending and reach out to them via [email or web address]. If 7PM-7AM, please contact night-coverage If you still have difficulty reaching the attending provider, please page the [facility] (Director on Call) for [facility] on amion for assistance.

## 2024-02-12 NOTE — Anesthesia Preprocedure Evaluation (Signed)
 Anesthesia Evaluation  Patient identified by MRN, date of birth, ID band Patient awake    Reviewed: Allergy & Precautions, NPO status , Patient's Chart, lab work & pertinent test results  Airway Mallampati: III  TM Distance: >3 FB Neck ROM: full    Dental  (+) Chipped   Pulmonary neg pulmonary ROS   Pulmonary exam normal        Cardiovascular negative cardio ROS Normal cardiovascular exam     Neuro/Psych negative neurological ROS  negative psych ROS   GI/Hepatic negative GI ROS, Neg liver ROS,,,  Endo/Other  negative endocrine ROS    Renal/GU negative Renal ROS  negative genitourinary   Musculoskeletal   Abdominal   Peds  Hematology negative hematology ROS (+)   Anesthesia Other Findings Patient has a fracture at the C1 vertebrae. Per neurosurgery's note, patient can proceed to surgery with caution on intubation. Will plan on leaving her neck brace on for intubation. Patient's POA, her [name], is aware and willing to proceed.   Past Medical History: No date: Arthritis No date: Cancer (HCC) No date: Dementia [facility])     Comment:  mild No date: DVT (deep venous thrombosis) (HCC) No date: High cholesterol No date: Hyperlipemia No date: Osteoporosis 02/19/2007: Squamous cell carcinoma of skin     Comment:  R cheek  Past Surgical History: No date: ABDOMINAL HYSTERECTOMY No date: EYE SURGERY No date: FRACTURE SURGERY No date: MASTECTOMY; Right No date: SKIN BIOPSY  BMI    Body Mass Index: 18.51 kg/m      Reproductive/Obstetrics negative OB ROS                              Anesthesia Physical Anesthesia Plan  ASA: 2  Anesthesia Plan: General ETT   Post-op Pain Management:    Induction: Intravenous  PONV Risk Score and Plan: 3 and Ondansetron  and Dexamethasone   Airway Management Planned: Oral ETT  Additional Equipment:   Intra-op Plan:   Post-operative Plan:  Extubation in OR  Informed Consent: I have reviewed the patients History and Physical, chart, labs and discussed the procedure including the risks, benefits and alternatives for the proposed anesthesia with the patient or authorized representative who has indicated his/her understanding and acceptance.     Dental Advisory Given and Consent reviewed with POA  Plan Discussed with: Anesthesiologist, CRNA and Surgeon  Anesthesia Plan Comments: (Patient's daughter, [name], consented for risks of anesthesia including but not limited to:  - adverse reactions to medications - damage to eyes, teeth, lips or other oral mucosa - nerve damage due to positioning  - sore throat or hoarseness - Damage to heart, brain, nerves, lungs, other parts of body or loss of life  Patient's daughter voiced understanding and assent.)        Anesthesia Quick Evaluation

## 2024-02-12 NOTE — Progress Notes (Addendum)
 SLP Cancellation Note  Patient Details Name: Anne Oneill MRN: [id number] DOB: 1927-05-30   Cancelled treatment:       Reason Eval/Treat Not Completed: Patient at procedure or test/unavailable (chart reviewed; consulted MD re; pt's status)  Pt is currently in Surgery for left hip fx repair s/p Fall at her Memory Care Facility. She is of advanced age(89y) w/ dx'd Dementia and under Hospice services.  ST services will f/u w/ pt's BSE tomorrow, as indicated.     Comer Portugal, MS, CCC-SLP Speech Language Pathologist Rehab Services; [facility] [phone] (ascom) Shaketta Rill 02/12/2024, 2:54 PM

## 2024-02-12 NOTE — Op Note (Signed)
 02/11/2024 - 02/12/2024  4:44 PM  Patient:   Anne Oneill  Pre-Op Diagnosis:   Displaced femoral neck fracture, left hip.  Post-Op Diagnosis:   Same.  Procedure:   Left hip bipolar hemiarthroplasty.  Surgeon:   DOROTHA Reyes Maltos, MD  Assistant:   Gustavo Level, PA-C  Anesthesia:   GET  Findings:   As above.  Complications:   None  EBL:   150 cc  Fluids:   2000 cc crystalloid  UOP:   300 cc  TT:   None  Drains:   None  Closure:   Staples  Implants:   Biomet Echo system with a #9 laterally offset cemented femoral stem, a 44 mm outer diameter shell, and a 28 mm head with a -6 mm neck adapter.  Brief Clinical Note:   The patient is a 89 year old female who sustained above-noted injury yesterday when she apparently lost her balance and fell while in her assisted living facility. She was brought to the emergency room where x-rays demonstrated the above-noted injury. The patient has been cleared medically and presents at this time for definitive management of the injury.  Procedure:   The patient was brought into the operating room and laid in the supine position. After adequate general endotracheal intubation and anesthesia was obtained, the patient underwent placement of a Foley catheter before she was repositioned in the right lateral decubitus position and secured using a lateral hip positioner. The bony prominences along the lower part of the leg were properly padded and an axillary roll was utilized. The left hip and lower extremity were prepped with ChloroPrep solution before being draped sterilely. Preoperative antibiotics were administered. A timeout was performed to verify the appropriate surgical site.    A standard posterior approach to the hip was made through an approximately 4-5 inch incision. The incision was carried down through the subcutaneous tissues to expose the gluteal fascia and proximal end of the iliotibial band. These structures were split the length of the  incision and the Charnley self-retaining hip retractor placed. The bursal tissues were swept posteriorly to expose the short external rotators. The anterior border of the piriformis tendon was identified and this plane developed down through the capsule to enter the joint. Abundant fracture hematoma was suctioned. A flap of tissue was elevated off the posterior aspect of the femoral neck and greater trochanter and retracted posteriorly. This flap included the piriformis tendon, the short external rotators, and the posterior capsule. The femoral head was removed in its entirety, then taken to the back table where it was measured and found to be optimally replicated by a 44 mm head. The appropriate trial head was inserted and found to demonstrate an excellent suction fit.   Attention was directed to the femoral side. The femoral neck was recut 10-12 mm above the lesser trochanter using an oscillating saw. The piriformis fossa was debrided of soft tissues before the intramedullary canal was accessed through this point using a triple step reamer. The canal was reamed sequentially beginning with a #7 tapered reamer and progressing to a #11 tapered reamer. This provided excellent circumferential chatter. A box osteotome was used to establish version before the canal was broached sequentially beginning with a #8 broach and progressing to a #11 broach. This was left in place and several trial reductions performed.   The femoral canal was prepared for cementing by using a bottle brush then irrigating it thoroughly. The small distal cement restrictor was placed down to the appropriate  depth before the canal was packed with a Neo-Synephrine soaked gauze. Meanwhile, the cement was being mixed on the back table. When it was ready, the cement was injected utilizing the proper third-generation cement technique with pressurization. The femoral stem was inserted and advanced while maintaining the appropriate version, then held  in place until the cement hardened.  The excess cement was removed using Personal assistant.   Once the cement hardened, a repeat trial reduction was performed using both the -6 mm and -3 mm neck lengths. The -6 mm neck length demonstrated excellent stability both in extension and external rotation as well as with flexion to 90 and internal rotation beyond 70. It also was stable in the position of sleep. The 44 mm outer diameter shell with the 28 mm head and -6 mm neck adapter construct was put together on the back table before being impacted onto the stem of the femoral component. The Morse taper locking mechanism was verified using manual distraction before the head was relocated and the hip placed through a range of motion with the findings as described above.  The wound was copiously irrigated with sterile saline solution via the jet lavage system before the peri-incisional and pericapsular tissues were injected with a cocktail of 20 cc of Exparel , 30 cc of 0.5% Sensorcaine , 2 cc of Kenalog  40 (80 mg), and 30 mg of Toradol  diluted out to 60 cc with normal saline to help with postoperative analgesia. The posterior flap was reapproximated to the posterior aspect of the greater trochanter using #2 Tycron interrupted sutures placed through drill holes. The iliotibial band was reapproximated using #1 Vicryl interrupted sutures before the gluteal fascia was closed using a running #0 Vicryl suture. The subcutaneous tissues were closed in several layers using 2-0 Vicryl interrupted sutures before the skin was closed using staples. A sterile occlusive dressing was applied to the wound. The patient then was rolled back into the supine position on the hospital bed before being awakened, extubated, and returned to the recovery room in satisfactory condition after tolerating the procedure well.

## 2024-02-12 NOTE — Progress Notes (Signed)
 [facility] ED 31 - [facility] Patient Visit  Patient is currently receiving hospice services at LTC facility with primary diagnosis cerebrovascular disease. Per Dr. Curlee [facility], current admission is related to diagnosis.  Patient was transported by EMS to ED following mechanical fall.  Patient was experiencing increased pain to left hip.  Facility staff called EMS for transport.  Patient is noted to have left hip fracture and is planned for pendment in OR today.    She is inpatient appropriate with need for skilled level of testing/monitoring and IV medications.      Vital Signs- 177/79 HR 111 RR 26 T 99.1 SpO2 96 % 2L [place]  I&O- not documented  Abnormal Labs-  Sodium 134, Calcium  8.6  Diagnostics-         EXAM: CT CERVICAL SPINE WITHOUT CONTRAST  IMPRESSION: 1. No acute intracranial abnormality. No skull fracture. 2. Stable atrophy and chronic small vessel ischemia.  EXAM: CT CERVICAL SPINE WITHOUT CONTRAST IMPRESSION: 1. Acute, nondisplaced type III odontoid fracture. 2. Acute nondisplaced fracture of the C1 posterior arch on the left. 3. Progressive displacement at site of a known fracture within the C1 anterior arch (located just to the left of midline). Displacement at this fracture site now measures 4 mm. 4. The left lateral atlantodental interval is now widened to 6 mm. A cervical spine MRI is recommended to exclude any new ligamentous injury. 5. Unchanged T3 compression fracture (with 60% vertebral body height loss and 4 mm bony retropulsion. 6. Nonspecific reversal of the expected cervical lordosis. 7. Grade 1 spondylolisthesis at C3-C4, C4-C5, C7-T1 and T1-T2. 8. Nonspecific reversal of the expected cervical lordosis. 9. Mild dextrocurvature of the cervical spine. 10. Cervical spondylosis as described.  EXAM: DG HIP (WITH OR WITHOUT PELVIS) 2-3V LEFT  IMPRESSION: Mildly displaced and impacted fracture of the left femoral  neck.   IV/PRN Meds- ceFAZolin  (ANCEF ) IVPB 2g/100 mL premix x 1 dose, fentaNYL  (SUBLIMAZE ) injection 25 mcg x 2 doses, hydrALAZINE  (APRESOLINE ) injection 5 mg  x 3 doses, ondansetron  (ZOFRAN ) injection 4 mg x 1 dose   Current plan as per Reeves Daisy, MD 9.2.25  Assessment and Plan: Ms. Cleek is a pleasant 89 y.o. female with C1 and C2 fractures.  She has been compliant with the brace.  She has had multiple falls.  On my review of her prior imaging, I suspect that she had the C2 fracture when she presented several weeks ago.   This appears to be a type II odontoid fracture in addition to a burst fracture of the C1 vertebral body.   Given her advanced dementia, I would not recommend surgical intervention.  I recommended that she continue her collar when out of bed.  She should remove her collar to eat and when asleep.   She will likely need her left hip pended for palliative reasons. She is clear from my standpoint to proceed with the following precautions. I would recommend she stay in her collar for intubation and throughout the case.  She can then have her collar removed in the recovery area.     Discharge Planning- ongoing Family Contact- Patients daughter , [name] [phone] IDT: updated  Goals of Care: DNR   Transfer summary and Med list sent to be placed under Media tab. Thank you for the opportunity to participate in this patient's care, please don't hesitate to call for any hospice related questions or concerns.  Daphne Shed, LPN [facility] Liaison [phone]

## 2024-02-12 NOTE — Transfer of Care (Signed)
 Immediate Anesthesia Transfer of Care Note  Patient: Anne Oneill  Procedure(s) Performed: HEMIARTHROPLASTY (BIPOLAR) HIP, POSTERIOR APPROACH FOR FRACTURE (Left: Hip)  Patient Location: PACU  Anesthesia Type:General  Level of Consciousness: drowsy and patient cooperative  Airway & Oxygen Therapy: Patient Spontanous Breathing and Patient connected to face mask oxygen  Post-op Assessment: Report given to RN and Post -op Vital signs reviewed and stable  Post vital signs: stable  Last Vitals:  Vitals Value Taken Time  BP 165/78 02/12/24 17:00  Temp    Pulse 91 02/12/24 17:02  Resp 17 02/12/24 17:02  SpO2 97 % 02/12/24 17:02  Vitals shown include unfiled device data.  Last Pain:  Vitals:   02/12/24 1214  TempSrc: Axillary  PainSc:          Complications: No notable events documented.

## 2024-02-12 NOTE — Anesthesia Postprocedure Evaluation (Signed)
 Anesthesia Post Note  Patient: Myisha Pickerel Keahey  Procedure(s) Performed: HEMIARTHROPLASTY (BIPOLAR) HIP, POSTERIOR APPROACH FOR FRACTURE (Left: Hip)  Patient location during evaluation: PACU Anesthesia Type: General Level of consciousness: awake and alert Pain management: pain level controlled Vital Signs Assessment: post-procedure vital signs reviewed and stable Respiratory status: spontaneous breathing, nonlabored ventilation, respiratory function stable and patient connected to nasal cannula oxygen Cardiovascular status: blood pressure returned to baseline and stable Postop Assessment: no apparent nausea or vomiting Anesthetic complications: no   There were no known notable events for this encounter.   Last Vitals:  Vitals:   02/12/24 1745 02/12/24 1809  BP: 139/75 (!) 187/83  Pulse: 79 89  Resp: 15 18  Temp:  36.8 C  SpO2: 95% 93%    Last Pain:  Vitals:   02/12/24 1809  TempSrc: Axillary  PainSc:                  Lynwood KANDICE Clause

## 2024-02-12 NOTE — ED Notes (Signed)
Pt transported to pre-op at this time.

## 2024-02-13 ENCOUNTER — Encounter: Payer: Self-pay | Admitting: Surgery

## 2024-02-13 DIAGNOSIS — S72042A Displaced fracture of base of neck of left femur, initial encounter for closed fracture: Secondary | ICD-10-CM | POA: Diagnosis not present

## 2024-02-13 LAB — CBC
HCT: 37.2 % (ref 36.0–46.0)
Hemoglobin: 12.5 g/dL (ref 12.0–15.0)
MCH: 30.4 pg (ref 26.0–34.0)
MCHC: 33.6 g/dL (ref 30.0–36.0)
MCV: 90.5 fL (ref 80.0–100.0)
Platelets: 185 K/uL (ref 150–400)
RBC: 4.11 MIL/uL (ref 3.87–5.11)
RDW: 13.3 % (ref 11.5–15.5)
WBC: 12.6 K/uL — ABNORMAL HIGH (ref 4.0–10.5)
nRBC: 0 % (ref 0.0–0.2)

## 2024-02-13 LAB — BASIC METABOLIC PANEL WITH GFR
Anion gap: 11 (ref 5–15)
BUN: 23 mg/dL (ref 8–23)
CO2: 19 mmol/L — ABNORMAL LOW (ref 22–32)
Calcium: 9.2 mg/dL (ref 8.9–10.3)
Chloride: 108 mmol/L (ref 98–111)
Creatinine, Ser: 0.94 mg/dL (ref 0.44–1.00)
GFR, Estimated: 56 mL/min — ABNORMAL LOW (ref >60)
Glucose, Bld: 159 mg/dL — ABNORMAL HIGH (ref 70–99)
Potassium: 4.2 mmol/L (ref 3.5–5.1)
Sodium: 138 mmol/L (ref 135–145)

## 2024-02-13 LAB — MAGNESIUM: Magnesium: 1.8 mg/dL (ref 1.7–2.4)

## 2024-02-13 LAB — PHOSPHORUS: Phosphorus: 2.1 mg/dL — ABNORMAL LOW (ref 2.5–4.6)

## 2024-02-13 MED ORDER — POTASSIUM PHOSPHATES 15 MMOLE/5ML IV SOLN
15.0000 mmol | Freq: Once | INTRAVENOUS | Status: AC
  Administered 2024-02-13: 15 mmol via INTRAVENOUS
  Filled 2024-02-13: qty 5

## 2024-02-13 MED ORDER — K PHOS MONO-SOD PHOS DI & MONO 155-852-130 MG PO TABS
500.0000 mg | ORAL_TABLET | Freq: Once | ORAL | Status: DC
  Filled 2024-02-13: qty 2

## 2024-02-13 MED ORDER — SODIUM CHLORIDE 0.9 % IV SOLN: INTRAVENOUS | Status: AC

## 2024-02-13 MED ORDER — ADULT MULTIVITAMIN W/MINERALS CH
1.0000 | ORAL_TABLET | Freq: Every day | ORAL | Status: DC
  Administered 2024-02-14 – 2024-02-18 (×5): 1 via ORAL
  Filled 2024-02-13 (×5): qty 1

## 2024-02-13 MED ORDER — NEPRO/CARBSTEADY PO LIQD
237.0000 mL | Freq: Two times a day (BID) | ORAL | Status: DC
  Administered 2024-02-14 – 2024-02-18 (×5): 237 mL via ORAL

## 2024-02-13 MED ORDER — POTASSIUM PHOSPHATES 15 MMOLE/5ML IV SOLN: 30.0000 mmol | Freq: Once | INTRAVENOUS | Status: DC

## 2024-02-13 MED ORDER — SPIRONOLACTONE 25 MG PO TABS
25.0000 mg | ORAL_TABLET | Freq: Every day | ORAL | Status: DC
  Administered 2024-02-14 – 2024-02-18 (×5): 25 mg via ORAL
  Filled 2024-02-13 (×5): qty 1

## 2024-02-13 MED ORDER — HYALURONIDASE HUMAN 150 UNIT/ML IJ SOLN
150.0000 [IU] | Freq: Once | INTRAMUSCULAR | Status: AC
  Administered 2024-02-13: 150 [IU] via SUBCUTANEOUS
  Filled 2024-02-13: qty 1

## 2024-02-13 MED ORDER — SODIUM BICARBONATE 8.4 % IV SOLN
50.0000 meq | Freq: Once | INTRAVENOUS | Status: AC
  Administered 2024-02-13: 50 meq via INTRAVENOUS
  Filled 2024-02-13: qty 50

## 2024-02-13 MED ORDER — CHLORHEXIDINE GLUCONATE CLOTH 2 % EX PADS
6.0000 | MEDICATED_PAD | Freq: Every day | CUTANEOUS | Status: DC
  Administered 2024-02-13 – 2024-02-18 (×5): 6 via TOPICAL

## 2024-02-13 NOTE — Evaluation (Signed)
 Occupational Therapy Evaluation Patient Details Name: Anne Oneill MRN: [id number] DOB: Jun 17, 1926 Today's Date: 02/13/2024   History of Present Illness   Ms. Varshini Arrants is a 89 year old female with history of mixed dementia, hyperlipidemia, osteoporosis, osteopenia, who presents emergency department for chief concerns of unwitnessed fall from [place] memory care unit. Patient is s/p L hemiarthroplasty, posterior approach. WBAT.   Clinical Impressions Ms Schiavi was seen for OT evaluation this date. Prior to hospital admission, pt was SUPERVISION for limited mobility at memory care unit, cues for ADLs, recent assist for dressing. Pt currently requires TOTAL A don c-collar and B socks bed level. TOTAL A x2 sup<>sit, zero sitting balance, pt resisting and pushing backwards. Family educated on OT role and POC. Pt would benefit from skilled OT to address noted impairments and functional limitations (see below for any additional details). Upon hospital discharge, recommend OT follow up.    If plan is discharge home, recommend the following:   Two people to help with walking and/or transfers;Two people to help with bathing/dressing/bathroom     Functional Status Assessment   Patient has had a recent decline in their functional status and demonstrates the ability to make significant improvements in function in a reasonable and predictable amount of time.     Equipment Recommendations   Other (comment) (defer)     Recommendations for Other Services         Precautions/Restrictions   Precautions Precautions: Fall;Posterior Hip Precaution Booklet Issued: No Recall of Precautions/Restrictions: Impaired Restrictions Weight Bearing Restrictions Per Provider Order: Yes LLE Weight Bearing Per Provider Order: Weight bearing as tolerated     Mobility Bed Mobility Overal bed mobility: Needs Assistance Bed Mobility: Sit to Supine, Supine to Sit     Supine to sit: Total  assist, +2 for physical assistance Sit to supine: Total assist, +2 for physical assistance        Transfers                   General transfer comment: unsafe to attempt      Balance Overall balance assessment: Needs assistance Sitting-balance support: Feet supported Sitting balance-Leahy Scale: Zero   Postural control: Posterior lean                                 ADL either performed or assessed with clinical judgement   ADL Overall ADL's : Needs assistance/impaired                                       General ADL Comments: TOTAL A don c-collar and B socks bed level      Vision         Perception         Praxis         Pertinent Vitals/Pain Pain Assessment Pain Assessment: PAINAD Breathing: normal Negative Vocalization: occasional moan/groan, low speech, negative/disapproving quality Facial Expression: sad, frightened, frown Body Language: rigid, fists clenched, knees up, pushing/pulling away, strikes out Consolability: distracted or reassured by voice/touch PAINAD Score: 5 Pain Intervention(s): Limited activity within patient's tolerance, Repositioned, Premedicated before session     Extremity/Trunk Assessment Upper Extremity Assessment Upper Extremity Assessment: Generalized weakness   Lower Extremity Assessment Lower Extremity Assessment: Generalized weakness LLE: Unable to fully assess due to pain LLE Coordination: decreased gross motor   Cervical /  Trunk Assessment Cervical / Trunk Assessment: Other exceptions Cervical / Trunk Exceptions: Wears neck brace due to C1 fracture in July.   Communication Communication Communication: No apparent difficulties   Cognition Arousal: Lethargic Behavior During Therapy: Anxious Cognition: History of cognitive impairments             OT - Cognition Comments: only states no dont do that that hurts                 Following commands:  Impaired Following commands impaired: Follows one step commands inconsistently     Cueing  General Comments   Cueing Techniques: Verbal cues      Exercises     Shoulder Instructions      Home Living Family/patient expects to be discharged to:: Skilled nursing facility                                 Additional Comments: [facility]      Prior Functioning/Environment Prior Level of Function : Needs assist             Mobility Comments: Since last fall with neck fracture, she has been needing more assistance, walks to bathroom only. She lives in memory care at [facility].  Prior to that she was ambulating to dining hall with RW. ADLs Comments: Requires assistance with ADLs    OT Problem List: Decreased strength;Decreased range of motion;Decreased activity tolerance;Impaired balance (sitting and/or standing);Decreased safety awareness   OT Treatment/Interventions: Self-care/ADL training;Therapeutic exercise;Energy conservation;DME and/or AE instruction;Therapeutic activities;Patient/family education      OT Goals(Current goals can be found in the care plan section)   Acute Rehab OT Goals Patient Stated Goal: to go to rehab OT Goal Formulation: With family Time For Goal Achievement: 02/27/24 Potential to Achieve Goals: Fair ADL Goals Pt Will Perform Grooming: with mod assist;sitting Pt Will Perform Lower Body Dressing: with max assist;sitting/lateral leans Pt Will Transfer to Toilet: with mod assist (rolling bed level)   OT Frequency:  Min 1X/week    Co-evaluation PT/OT/SLP Co-Evaluation/Treatment: Yes Reason for Co-Treatment: Necessary to address cognition/behavior during functional activity;For patient/therapist safety;To address functional/ADL transfers PT goals addressed during session: Mobility/safety with mobility;Balance OT goals addressed during session: ADL's and self-care      AM-PAC OT 6 Clicks Daily Activity     Outcome  Measure Help from another person eating meals?: Total Help from another person taking care of personal grooming?: Total Help from another person toileting, which includes using toliet, bedpan, or urinal?: Total Help from another person bathing (including washing, rinsing, drying)?: Total Help from another person to put on and taking off regular upper body clothing?: Total Help from another person to put on and taking off regular lower body clothing?: Total 6 Click Score: 6   End of Session Equipment Utilized During Treatment: Oxygen Nurse Communication: Mobility status  Activity Tolerance: Patient tolerated treatment well Patient left: in bed;with call bell/phone within reach;with family/visitor present  OT Visit Diagnosis: Other abnormalities of gait and mobility (R26.89);Muscle weakness (generalized) (M62.81)                Time: 8847-8789 OT Time Calculation (min): 18 min Charges:  OT General Charges $OT Visit: 1 Visit OT Evaluation $OT Eval Moderate Complexity: 1 Mod  Elston Slot, M.S. OTR/L  02/13/24, 1:31 PM  ascom [phone]

## 2024-02-13 NOTE — Progress Notes (Signed)
 PT Cancellation Note  Patient Details Name: Anne Oneill MRN: [id number] DOB: 02/06/1927   Cancelled Treatment:    Reason Eval/Treat Not Completed: Patient's level of consciousness. Patient has not had any pain meds yet, waiting for SLP assessment. Will re-attempt later this am.    Christopher Hink 02/13/2024, 9:10 AM

## 2024-02-13 NOTE — Progress Notes (Signed)
  Subjective: 1 Day Post-Op Procedure(s) (LRB): HEMIARTHROPLASTY (BIPOLAR) HIP, POSTERIOR APPROACH FOR FRACTURE (Left) Patient reports no pain this morning.  Does not remember surgery, history of dementia. Patient is well, in C-collar due to cervical injury. Patient was in memory care unit prior to recent injury. Negative for chest pain and shortness of breath Fever: no Gastrointestinal:Negative for nausea and vomiting  Objective: Vital signs in last 24 hours: Temp:  [97.2 F (36.2 C)-99.5 F (37.5 C)] 98 F (36.7 C) (09/04 0219) Pulse Rate:  [79-102] 96 (09/04 0331) Resp:  [15-26] 19 (09/04 0219) BP: (128-187)/(64-97) 128/64 (09/04 0331) SpO2:  [91 %-98 %] 96 % (09/04 0219)  Intake/Output from previous day:  Intake/Output Summary (Last 24 hours) at 02/13/2024 0721 Last data filed at 02/12/2024 1643 Gross per 24 hour  Intake 2031.67 ml  Output 300 ml  Net 1731.67 ml    Intake/Output this shift: No intake/output data recorded.  Labs: Recent Labs    02/11/24 1719 02/12/24 0349 02/13/24 0524  HGB 13.9 13.2 12.5   Recent Labs    02/12/24 0349 02/13/24 0524  WBC 10.1 12.6*  RBC 4.44 4.11  HCT 40.4 37.2  PLT 202 185   Recent Labs    02/12/24 0349 02/13/24 0524  NA 134* 138  K 3.9 4.2  CL 102 108  CO2 22 19*  BUN 20 23  CREATININE 0.68 0.94  GLUCOSE 130* 159*  CALCIUM  8.6* 9.2   No results for input(s): LABPT, INR in the last 72 hours.   EXAM General - Patient is Alert and Confused Extremity - ABD soft Sensation intact distally Incision: Mild bloody drainage noted to the left hip honeycomb dressing Compartment soft Dressing/Incision - New honeycomb dressing applied to the left hip. Motor Function - intact, moving foot and toes well on exam.  Past Medical History:  Diagnosis Date   Arthritis    Cancer (HCC)    Dementia (HCC)    mild   DVT (deep venous thrombosis) (HCC)    High cholesterol    Hyperlipemia    Osteoporosis    Squamous cell  carcinoma of skin 02/19/2007   R cheek    Assessment/Plan: 1 Day Post-Op Procedure(s) (LRB): HEMIARTHROPLASTY (BIPOLAR) HIP, POSTERIOR APPROACH FOR FRACTURE (Left) Principal Problem:   Closed left femoral fracture (HCC) Active Problems:   History of breast cancer   History of TIA (transient ischemic attack)   Hyperlipidemia   Mixed Alzheimer's and vascular dementia (HCC)   Osteoporosis   Hypertensive urgency   Thrombocytopenia (HCC)   Multiple closed fractures of cervical vertebrae (HCC)   Closed fracture of odontoid process of axis (HCC)  Estimated body mass index is 18.51 kg/m as calculated from the following:   Height as of this encounter: 5' (1.524 m).   Weight as of this encounter: 43 kg. Up with therapy D/C IV fluids when tolerating PO intake.  Labs and vitals reviewed.  WBC 12.6. Hg 12.5. Up with therapy as much as can tolerated today. Continue to work on a BM. History of dementia, denies pain this AM.  DVT Prophylaxis - Lovenox  and TED hose Weight-Bearing as tolerated to left leg  J. Gustavo Level, PA-C [facility] 02/13/2024, 7:21 AM

## 2024-02-13 NOTE — Progress Notes (Signed)
 [facility] Progress Note  Patient: Anne Oneill    FMW:[id number]  DOA: 02/11/2024     Date of Service: the patient was seen and examined on 02/13/2024  Chief Complaint  Patient presents with   Fall   Hip Pain   Brief hospital course: Ms. Candid Bovey is a 89 year old female with history of mixed dementia, hyperlipidemia, osteoporosis, osteopenia, who presents emergency department for chief concerns of unwitnessed fall from [place] memory care unit.   Vitals in the ED showed temperature of 99.6, respiration rate 17, heart rate 79, blood pressure 208/98, SpO2 of 94% on 4 L nasal cannula.   Serum sodium is 137, potassium 4.4, chloride 103, bicarb 23, BUN of 29, serum creatinine of 0.77, EGFR greater than 60, nonfasting blood glucose 126, WBC 7.9, hemoglobin 13.9, platelet 211.   ED treatment: Morphine  4 mg IV one-time dose, ondansetron  4 mg IV one-time dose, sodium chloride  500 mL liter bolus  Orthopedic surgery and neurosurgery consulted. TRH consulted for admission and further management as appropriate   Assessment and Plan:   # Left femoral fracture, closed due to fall Continue as needed meds for pain control Orthopedic surgery consulted, s/p Left hip hemiarthroplasty  done on 9/3   # Multiple closed fractures of cervical vertebrae  Seen by neurosurgery, recommended no surgical intervention, continue cervical collar remains out of bed. She should remove her collar to eat and when asleep.  She is cleared for hip surgery, recommended she stay in her collar for intubation and throughout the case.  She can then have her collar removed in the recovery area.   Mixed Alzheimer's and vascular dementia Home memantine  10 mg p.o. twice daily resumed on admission   Hypertensive urgency Home spironolactone  25 mg daily, metoprolol  succinate 50 mg daily were resumed on admission Secondary to pain with multiple cervical fractures and femur fracture Hydralazine  10 mg IV every 6  hr prn SBP >170   Hyperlipidemia Simvastatin  20 mg nightly resumed  Hypophosphatemia secondary to nutritional deficiency Phos repleted. Monitor electrolytes daily and replete as needed.  Metabolic acidosis, bicarbonate 50 mEq IV x 1 dose given on 9/4 Check BMP daily  Dysphagia SLP eval done, started on pured diet with nectar thin liquids. Medications crushed with pured. Continue IV fluid for hydration   Body mass index is 18.51 kg/m.  Interventions:  Diet: Pured with nectar thick liquids DVT Prophylaxis: Subcutaneous Lovenox    Advance goals of care discussion: DNR-Limited  Family Communication: Family was present at bedside, at the time of interview.  The pt provided permission to discuss medical plan with the family. Opportunity was given to ask question and all questions were answered satisfactorily.   Disposition:  Pt is from [facility] under [facility] care, admitted with C-spine abd Left femoral fx, s/p left hip hemiarthroplasty on 9/3, which precludes a safe discharge. Discharge back the facility, when stable and cleared by [facility].  Subjective: No significant events overnight.  Patient was resting comfortably.  Patient is significant dementia, unable to offer any complaints.    Physical Exam: General: NAD, lying comfortably Appear in no distress, affect appropriate Eyes: PERRLA ENT: Neck collar intact Neck: no JVD,  Cardiovascular: S1 and S2 Present, no Murmur,  Respiratory: good respiratory effort, Bilateral Air entry equal and Decreased, no Crackles, no wheezes Abdomen: Bowel Sound present, Soft and no tenderness,  Skin: no rashes Extremities: s/p Left hemiarthroplasty, dressing CDI.   no significant Pedal edema Neurologic: without any new focal findings Gait not  checked due to patient safety concerns  Vitals:   02/13/24 0822 02/13/24 1125 02/13/24 1231 02/13/24 1420  BP: 139/68 (!) 156/77  (!) 144/63  Pulse: (!) 115 (!) 121  96  Resp:  18 (!) 22  16  Temp: 98.6 F (37 C) 99.1 F (37.3 C)    TempSrc:      SpO2: 94% 93% 94% 92%  Weight:      Height:        Intake/Output Summary (Last 24 hours) at 02/13/2024 1500 Last data filed at 02/13/2024 0700 Gross per 24 hour  Intake 2941.67 ml  Output 300 ml  Net 2641.67 ml   Filed Weights   02/11/24 1716  Weight: 43 kg    Data Reviewed: I have personally reviewed and interpreted daily labs, tele strips, imagings as discussed above. I reviewed all nursing notes, pharmacy notes, vitals, pertinent old records I have discussed plan of care as described above with RN and patient/family.  CBC: Recent Labs  Lab 02/11/24 1719 02/12/24 0349 02/13/24 0524  WBC 7.9 10.1 12.6*  NEUTROABS 5.6  --   --   HGB 13.9 13.2 12.5  HCT 41.9 40.4 37.2  MCV 92.1 91.0 90.5  PLT 211 202 185   Basic Metabolic Panel: Recent Labs  Lab 02/11/24 1719 02/12/24 0349 02/13/24 0524  NA 137 134* 138  K 4.4 3.9 4.2  CL 103 102 108  CO2 23 22 19*  GLUCOSE 126* 130* 159*  BUN 29* 20 23  CREATININE 0.77 0.68 0.94  CALCIUM  9.3 8.6* 9.2  MG  --  1.7 1.8  PHOS  --  2.9 2.1*    Studies: DG HIP UNILAT W OR W/O PELVIS 2-3 VIEWS LEFT Result Date: 02/12/2024 CLINICAL DATA:  Status post hip hemiarthroplasty. EXAM: DG HIP (WITH OR WITHOUT PELVIS) 2-3V LEFT COMPARISON:  Preoperative exam. FINDINGS: Left hip hemiarthroplasty in expected alignment. No periprosthetic lucency or fracture. Recent postsurgical change includes air and edema in the soft tissues. Overlying skin staples in place. Right femoral intramedullary nail is partially included in the field of view. IMPRESSION: Left hip hemiarthroplasty without immediate postoperative complication. Electronically Signed   By: Andrea Gasman M.D.   On: 02/12/2024 19:23    Scheduled Meds:  Chlorhexidine  Gluconate Cloth  6 each Topical Daily   cyanocobalamin   1,000 mcg Oral Daily   enoxaparin  (LOVENOX ) injection  30 mg Subcutaneous QPM   escitalopram    10 mg Oral Daily   feeding supplement (NEPRO CARB STEADY)  237 mL Oral BID BM   metoprolol  succinate  50 mg Oral Daily   multivitamin with minerals  1 tablet Oral Daily   phosphorus  500 mg Oral Once   simvastatin   20 mg Oral QHS   [START ON 02/14/2024] spironolactone   25 mg Oral Daily   Continuous Infusions:  sodium chloride  50 mL/hr at 02/13/24 0925   PRN Meds: acetaminophen  **OR** acetaminophen , fentaNYL  (SUBLIMAZE ) injection, hydrALAZINE , labetalol , ondansetron  **OR** ondansetron  (ZOFRAN ) IV, oxyCODONE   Time spent: 55 minutes  Author: ELVAN SOR. MD [facility] 02/13/2024 3:00 PM  To reach On-call, see care teams to locate the attending and reach out to them via [email or web address]. If 7PM-7AM, please contact night-coverage If you still have difficulty reaching the attending provider, please page the [facility] (Director on Call) for [facility] on amion for assistance.

## 2024-02-13 NOTE — Progress Notes (Signed)
 Initial Nutrition Assessment  DOCUMENTATION CODES:   Not applicable  INTERVENTION:   -MVI with minerals daily -Nepro Shake po BID, each supplement provides 425 kcal and 19 grams protein  -Magic cup TID with meals, each supplement provides 290 kcal and 9 grams of protein  -Feeding assistance with meals  NUTRITION DIAGNOSIS:   Increased nutrient needs related to post-op healing as evidenced by estimated needs.  GOAL:   Patient will meet greater than or equal to 90% of their needs  MONITOR:   PO intake, Supplement acceptance  REASON FOR ASSESSMENT:   Malnutrition Screening Tool    ASSESSMENT:   Pt with history of mixed dementia, hyperlipidemia, osteoporosis, osteopenia, who presented for chief concerns of unwitnessed fall from [place] memory care unit.  Pt admitted with closed lt femoral fracture.   9/3- s/p  Left hip bipolar hemiarthroplasty 9/4- s/p BSE- dysphagia 1 diet with thin liquids  Reviewed I/O's: +2.7 L x 24 hours  UOP: 300 ml x 24 hours  Pt lethargic at time of visit. History obtain from pt's daughter at bedside, who confirmed pt was at [facility]. Home diet was a dysphagia 1 diet with nectar thick liquids. Per daughter, pt had a good appetite until a month ago, when she broke her neck. Due to the c-collar, but had a difficult time feeding herself and was having difficulty chewing solid foods. SHe was initially on a mechanical soft diet and was changed to a puree diet for ease of intake. Pt requested feeding assistance and was downgraded to nectar thick liquids secondary to coughing while eating.   Daughter has noticed decline in intake due to puree diet not being appetizing and dislike of thickened liquid texture.   Pt daughter reports that pt will likely not eat much due to mental status, but hopeful for improvement.   Per daughter, pt's UBW was around 94#, but had gained weight due to being less active, gaining up to 120#. She thinks pt has lost  weight within the past few weeks due to decreased oral intake, but unsure how much. Unsure if wt is a stated wt or actual wt. RD will obtain new wt to better assess weight changes.   Discussed importance of good meal and supplement intake to promote healing. Amenable to supplements.   Medications reviewed and include vitamin B-12, lovenox , lexapro , phosphorus, aldactone , and 0.9% sodium chloride  infusion @ 50 ml/hr.   Labs reviewed.    NUTRITION - FOCUSED PHYSICAL EXAM:  Flowsheet Row Most Recent Value  Orbital Region No depletion  Upper Arm Region Mild depletion  Thoracic and Lumbar Region No depletion  Buccal Region No depletion  Temple Region No depletion  Clavicle Bone Region Mild depletion  Clavicle and Acromion Bone Region Mild depletion  Scapular Bone Region Mild depletion  Dorsal Hand No depletion  Patellar Region Mild depletion  Anterior Thigh Region Mild depletion  Posterior Calf Region Mild depletion  Edema (RD Assessment) Mild  Hair Reviewed  Eyes Reviewed  Mouth Reviewed  Skin Reviewed  Nails Reviewed    Diet Order:   Diet Order             DIET - DYS 1 Room service appropriate? No; Fluid consistency: Nectar Thick  Diet effective now                   EDUCATION NEEDS:   Education needs have been addressed  Skin:  Skin Assessment: Skin Integrity Issues: Skin Integrity Issues:: Incisions Incisions: closed lt hip  Last BM:  Unknown  Height:   Ht Readings from Last 1 Encounters:  02/11/24 5' (1.524 m)    Weight:   Wt Readings from Last 1 Encounters:  02/11/24 43 kg    Ideal Body Weight:  45.5 kg  BMI:  Body mass index is 18.51 kg/m.  Estimated Nutritional Needs:   Kcal:  1200-1400  Protein:  60-75 grams  Fluid:  1.2-1.4 L    Margery ORN, RD, LDN, CDCES Registered Dietitian III Certified Diabetes Care and Education Specialist If unable to reach this RD, please use RD Inpatient group chat on secure chat between hours of 8am-4  pm daily

## 2024-02-13 NOTE — Evaluation (Signed)
 Clinical/Bedside Swallow Evaluation Patient Details  Name: Anne Oneill MRN: [id number] Date of Birth: 1926-10-16  Today's Date: 02/13/2024 Time: SLP Start Time (ACUTE ONLY): 0900 SLP Stop Time (ACUTE ONLY): 0930 SLP Time Calculation (min) (ACUTE ONLY): 30 min  Past Medical History:  Past Medical History:  Diagnosis Date   Arthritis    Cancer (HCC)    Dementia (HCC)    mild   DVT (deep venous thrombosis) (HCC)    High cholesterol    Hyperlipemia    Osteoporosis    Squamous cell carcinoma of skin 02/19/2007   R cheek   Past Surgical History:  Past Surgical History:  Procedure Laterality Date   ABDOMINAL HYSTERECTOMY     EYE SURGERY     FRACTURE SURGERY     MASTECTOMY Right    SKIN BIOPSY     HPI:  Per H&P, [name] is a 89 year old female with history of mixed dementia, hyperlipidemia, osteoporosis, osteopenia, who presents emergency department for chief concerns of unwitnessed fall from [place] memory care unit.  DG Chest 9/2: No active disease. Mild cardiomegaly.   Assessment / Plan / Recommendation  Clinical Impression  Pt seen for bedside swallow evaluation in the setting of report of trouble swallowing per family. Daughter in room and reported increase in coughing and difficulty with mastication since fall on 7/29. Prior to current admission pt on a pureed diet with medications crushed in puree at facility. Pt was requiring assistance for feeding PTA.   Today, pt on 2L nasal canula, with significant lethargy limiting assessment. Consistent verbal and tactile cues required for sufficient, sustained alertness for completion of PO intake. Hand over hand assist provided for holding cup; however, pt with noted oral confusion with presentation of cup and straw, leading to oral groping/opening and limited ability to pull liquid from straw and seal lips to cup. No overt oral weakness, though noted labial edema (daughter reports gradual improvement).   Overt  immediate cough noted across trials of thin liquid, suspect related to reduced oral control and delayed swallow initiation. Audible swallow noted, indication potential for uncoordinated pharyngeal swallow function. Tsp of nectar trials completed with minimal dry throat clearing- no overt coughing. No cough/throat clear with puree trials. Further presentation via tsp for puree and nectar thick liquids aided oral acceptance/control and awareness for trials. Prolonged oral transit, with oral residue noted with puree, aided by extended time for clearance.   Education shared with daughter regarding pros and cons of prolonged use of thickened liquids, including current benefit of increased awareness for bolus in oral cavity and potential impact on quality of life and hydration. Daughter reported understanding.   Pt is at increased risk for aspiration in the setting of age, deconditioning, dependency with feeding, and mentation (baseline dementia). Therefore, recommend aspiration precautions, including slow rate, small bites, elevated HOB, and SUSTAINED ALERTNESS for duration of meal/intake. Dys 1 (puree) and nectar thick- VIA TSP. Visual inspection for oral clearance- allow extended time. MD and RN aware of recommendations. SLP will continue to follow for dysphagia intervention.    SLP Visit Diagnosis: Dysphagia, unspecified (R13.10) (impacted by current lethargy, mentation, debility)    Aspiration Risk  Moderate aspiration risk    Diet Recommendation   Dysphagia 1 (puree);Nectar  Medication Administration: Crushed with puree    Other  Recommendations Oral Care Recommendations: Oral care before and after PO;Staff/trained caregiver to provide oral care     Assistance Recommended at Discharge  Total assist with PO intake  Functional  Status Assessment Patient has had a recent decline in their functional status and/or demonstrates limited ability to make significant improvements in function in a reasonable  and predictable amount of time (question acute vs chronic nature of concern based on daughter's report of coughing since initial fall in July.)  Frequency and Duration min 2x/week  2 weeks       Prognosis Prognosis for improved oropharyngeal function: Fair Barriers to Reach Goals: Cognitive deficits;Severity of deficits      Swallow Study   General Date of Onset: 02/13/24 HPI: Per H&P, [name] is a 89 year old female with history of mixed dementia, hyperlipidemia, osteoporosis, osteopenia, who presents emergency department for chief concerns of unwitnessed fall from [place] memory care unit. Type of Study: Bedside Swallow Evaluation Previous Swallow Assessment: MBSS in 2022 revealing adequate oropharyngeal abilities Diet Prior to this Study: NPO Temperature Spikes Noted: No (WBC 12.6) Respiratory Status: Nasal cannula (2L) History of Recent Intubation:  (brief intubation for procedure only) Behavior/Cognition: Lethargic/Drowsy;Doesn't follow directions Oral Cavity Assessment: Edema (lips) Oral Care Completed by SLP: Recent completion by staff Oral Cavity - Dentition: Adequate natural dentition Vision:  (eyes closed for majority of session) Self-Feeding Abilities: Needs assist;Needs set up Patient Positioning: Upright in bed Baseline Vocal Quality: Low vocal intensity Volitional Cough: Cognitively unable to elicit Volitional Swallow: Unable to elicit    Oral/Motor/Sensory Function Overall Oral Motor/Sensory Function:  (grosspy intact- limited command following)   Ice Chips Ice chips: Not tested   Thin Liquid Thin Liquid: Impaired Presentation: Cup;Straw Oral Phase Impairments: Reduced labial seal;Poor awareness of bolus Oral Phase Functional Implications:  (anterior loss) Pharyngeal  Phase Impairments: Cough - Immediate (with prolonged recovery)    Nectar Thick Nectar Thick Liquid: Within functional limits Presentation: Spoon   Honey Thick Honey Thick Liquid:  Not tested   Puree Puree: Impaired Presentation: Spoon Oral Phase Impairments: Poor awareness of bolus;Impaired mastication Oral Phase Functional Implications: Oral residue;Prolonged oral transit Pharyngeal Phase Impairments:  (none)   Solid     Solid: Not tested     [place] Gennett Garcia Clapp, MS, CCC-SLP Speech Language Pathologist Rehab Services; [facility] [phone] (ascom)   [place] J Clapp 02/13/2024,11:38 AM

## 2024-02-13 NOTE — Progress Notes (Signed)
 [facility] room 151 - [facility] Patient Visit   Anne Oneill is currently receiving hospice services at LTC facility with primary diagnosis cerebrovascular disease. Per Dr. Curlee [facility], current admission is related to diagnosis.  Patient was transported by EMS to ED following mechanical fall.  Patient was experiencing increased pain to left hip.  Facility staff called EMS for transport.  Patient is noted to have left hip fracture and is planned for pendment in OR today.  Visited with patient and daughter, Anne Oneill, at bedside.  Patient had a Left hip bipolar hemiarthroplasty yesterday evening. Patient quite sedated this am, according to [facility].  Anne Oneill would prefer that patient return to [facility] at discharge since it has been home to the patient for a few years, and she is familiar with the staff and surroundings.  She is open to SNF rehab if needed.   She is inpatient appropriate with need for skilled level of testing/monitoring and IV medications.       Vital Signs- 98.6, 115, 18, SpO2 94 % room air   I&O- 3041.7/300   Abnormal Labs- CO2 19, Glucose 159,  Phosphorus 2.1 GFR 56, WBC 12.6   Diagnostics-   Narrative & Impression  CLINICAL DATA:  Status post hip hemiarthroplasty.   EXAM: DG HIP (WITH OR WITHOUT PELVIS) 2-3V LEFT   COMPARISON:  Preoperative exam.   FINDINGS: Left hip hemiarthroplasty in expected alignment. No periprosthetic lucency or fracture. Recent postsurgical change includes air and edema in the soft tissues. Overlying skin staples in place. Right femoral intramedullary nail is partially included in the field of view.   IMPRESSION: Left hip hemiarthroplasty without immediate postoperative complication.      Electronically Signed   By: Andrea Gasman M.D.   On: 02/12/2024 19:23    IV/PRN Meds- ceFAZolin  (ANCEF ) IVPB 2g/100 mL premix x 1 dose, fentaNYL  (SUBLIMAZE ) injection 25 mcg x 2 doses,  hydrALAZINE  (APRESOLINE ) injection 5 mg  x 3 doses, ondansetron  (ZOFRAN ) injection 4 mg x 1 dose Apresoline  10mg  q 6hrs prn IV x 1,     Current plan as per Elvan Sor, MD 9.4.25  # Left femoral fracture, closed due to fall Continue as needed meds for pain control Orthopedic surgery consulted, s/p Left hip hemiarthroplasty  done on 9/3     # Multiple closed fractures of cervical vertebrae  Seen by neurosurgery, recommended no surgical intervention, continue cervical collar remains out of bed. She should remove her collar to eat and when asleep.  She is cleared for hip surgery, recommended she stay in her collar for intubation and throughout the case.  She can then have her collar removed in the recovery area.    Mixed Alzheimer's and vascular dementia Home memantine  10 mg p.o. twice daily resumed on admission   Hypertensive urgency Home spironolactone  25 mg daily, metoprolol  succinate 50 mg daily were resumed on admission Secondary to pain with multiple cervical fractures and femur fracture Hydralazine  10 mg IV every 6 hr prn SBP >170   Hyperlipidemia Simvastatin  20 mg nightly resumed   Hypophosphatemia secondary to nutritional deficiency Phos repleted. Monitor electrolytes daily and replete as needed.   Metabolic acidosis, bicarbonate 50 mEq IV x 1 dose given on 9/4 Check BMP daily   Dysphagia SLP eval done, started on pured diet with nectar thin liquids. Medications crushed with pured. Continue IV fluid for hydration     Body mass index is 18.51 kg/m.  Interventions:   Diet: Pured with  nectar thick liquids DVT Prophylaxis: Subcutaneous Lovenox     Discharge Planning- ongoing-DC back to [facility] vs SNF rehab.   Family Contact- Patients daughter, Anne Oneill [phone] IDT: updated  Goals of Care: DNR    Transfer summary and Med list sent to be placed under Media tab. Thank you for the opportunity to participate in this patient's care, please don't hesitate to call  for any hospice related questions or concerns.  [facility] Liaison [phone]

## 2024-02-13 NOTE — Evaluation (Signed)
 Physical Therapy Evaluation Patient Details Name: Anne Oneill MRN: [id number] DOB: 03/13/1927 Today's Date: 02/13/2024  History of Present Illness  [name] is a 89 year old female with history of mixed dementia, hyperlipidemia, osteoporosis, osteopenia, who presents emergency department for chief concerns of unwitnessed fall from [place] memory care unit. Patient is s/p L hemiarthroplasty, posterior approach. WBAT.   Clinical Impression  Patient received in bed, sleeping. She is poorly responsive. Aspen collar donned in supine prior to mobility. Patient is resistant to movement, requiring +2 total Assist for all bed mobility. Heavy posterior leaning in sitting requiring max assist to maintain balance. She will continue to benefit from trial of skilled PT to see if she is able to progress mobility.            If plan is discharge home, recommend the following: Two people to help with walking and/or transfers;Two people to help with bathing/dressing/bathroom   Can travel by private vehicle   No    Equipment Recommendations None recommended by PT  Recommendations for Other Services       Functional Status Assessment Patient has had a recent decline in their functional status and/or demonstrates limited ability to make significant improvements in function in a reasonable and predictable amount of time     Precautions / Restrictions Precautions Precautions: Fall;Posterior Hip Precaution Booklet Issued: No Recall of Precautions/Restrictions: Impaired Restrictions Weight Bearing Restrictions Per Provider Order: Yes LLE Weight Bearing Per Provider Order: Weight bearing as tolerated      Mobility  Bed Mobility Overal bed mobility: Needs Assistance Bed Mobility: Sit to Supine, Supine to Sit     Supine to sit: HOB elevated, Total assist, +2 for physical assistance Sit to supine: Total assist, +2 for physical assistance   General bed mobility comments: No assistance  provided by patient at all. She resists movements.    Transfers                   General transfer comment: unable    Ambulation/Gait               General Gait Details: unable  Stairs            Wheelchair Mobility     Tilt Bed    Modified Rankin (Stroke Patients Only)       Balance Overall balance assessment: Needs assistance Sitting-balance support: Feet supported Sitting balance-Leahy Scale: Zero   Postural control: Posterior lean                                   Pertinent Vitals/Pain Pain Assessment Pain Assessment: PAINAD Breathing: normal Negative Vocalization: occasional moan/groan, low speech, negative/disapproving quality Facial Expression: sad, frightened, frown Body Language: rigid, fists clenched, knees up, pushing/pulling away, strikes out Consolability: distracted or reassured by voice/touch PAINAD Score: 5    Home Living Family/patient expects to be discharged to:: Skilled nursing facility                        Prior Function Prior Level of Function : Needs assist             Mobility Comments: Since last fall with neck fracture, she has been needing more assistance. She lives in memory care at [place].  Prior to that she was ambulating with RW. ADLs Comments: Requires assistance with ADLs     Extremity/Trunk Assessment   Upper  Extremity Assessment Upper Extremity Assessment: Defer to OT evaluation    Lower Extremity Assessment Lower Extremity Assessment: LLE deficits/detail LLE: Unable to fully assess due to pain LLE Coordination: decreased gross motor    Cervical / Trunk Assessment Cervical / Trunk Assessment: Other exceptions Cervical / Trunk Exceptions: Wears neck brace due to C1 fracture in July.  Communication   Communication Communication: No apparent difficulties    Cognition Arousal: Lethargic Behavior During Therapy: Anxious   PT - Cognitive impairments: History of  cognitive impairments                         Following commands: Impaired Following commands impaired: Follows one step commands inconsistently     Cueing Cueing Techniques: Verbal cues     General Comments      Exercises     Assessment/Plan    PT Assessment Patient needs continued PT services  PT Problem List Decreased strength;Decreased range of motion;Decreased activity tolerance;Decreased balance;Decreased mobility;Pain;Decreased cognition;Decreased safety awareness;Decreased knowledge of precautions       PT Treatment Interventions DME instruction;Gait training;Functional mobility training;Therapeutic activities;Therapeutic exercise;Balance training;Cognitive remediation;Neuromuscular re-education;Patient/family education    PT Goals (Current goals can be found in the Care Plan section)  Acute Rehab PT Goals Patient Stated Goal: go to rehab PT Goal Formulation: With family Time For Goal Achievement: 02/27/24 Potential to Achieve Goals: Fair    Frequency Min 2X/week     Co-evaluation PT/OT/SLP Co-Evaluation/Treatment: Yes Reason for Co-Treatment: Necessary to address cognition/behavior during functional activity;For patient/therapist safety;To address functional/ADL transfers PT goals addressed during session: Mobility/safety with mobility;Balance         AM-PAC PT 6 Clicks Mobility  Outcome Measure Help needed turning from your back to your side while in a flat bed without using bedrails?: Total Help needed moving from lying on your back to sitting on the side of a flat bed without using bedrails?: Total Help needed moving to and from a bed to a chair (including a wheelchair)?: Total Help needed standing up from a chair using your arms (e.g., wheelchair or bedside chair)?: Total Help needed to walk in hospital room?: Total Help needed climbing 3-5 steps with a railing? : Total 6 Click Score: 6    End of Session Equipment Utilized During  Treatment: Oxygen Activity Tolerance: Patient limited by lethargy;Patient limited by pain Patient left: in bed;with call bell/phone within reach;with bed alarm set;with family/visitor present;with SCD's reapplied Nurse Communication: Mobility status PT Visit Diagnosis: Other abnormalities of gait and mobility (R26.89);Pain Pain - Right/Left: Left Pain - part of body: Leg    Time: 8850-8789 PT Time Calculation (min) (ACUTE ONLY): 21 min   Charges:   PT Evaluation $PT Eval Moderate Complexity: 1 Mod   PT General Charges $$ ACUTE PT VISIT: 1 Visit         Zyionna Pesce, PT, GCS 02/13/24,12:41 PM

## 2024-02-14 DIAGNOSIS — G309 Alzheimer's disease, unspecified: Secondary | ICD-10-CM | POA: Diagnosis not present

## 2024-02-14 DIAGNOSIS — S12100D Unspecified displaced fracture of second cervical vertebra, subsequent encounter for fracture with routine healing: Secondary | ICD-10-CM

## 2024-02-14 DIAGNOSIS — E782 Mixed hyperlipidemia: Secondary | ICD-10-CM

## 2024-02-14 DIAGNOSIS — Z853 Personal history of malignant neoplasm of breast: Secondary | ICD-10-CM

## 2024-02-14 DIAGNOSIS — F0150 Vascular dementia without behavioral disturbance: Secondary | ICD-10-CM

## 2024-02-14 DIAGNOSIS — S129XXD Fracture of neck, unspecified, subsequent encounter: Secondary | ICD-10-CM

## 2024-02-14 DIAGNOSIS — Z8673 Personal history of transient ischemic attack (TIA), and cerebral infarction without residual deficits: Secondary | ICD-10-CM

## 2024-02-14 DIAGNOSIS — M8000XS Age-related osteoporosis with current pathological fracture, unspecified site, sequela: Secondary | ICD-10-CM

## 2024-02-14 DIAGNOSIS — F0280 Dementia in other diseases classified elsewhere without behavioral disturbance: Secondary | ICD-10-CM

## 2024-02-14 DIAGNOSIS — S72042A Displaced fracture of base of neck of left femur, initial encounter for closed fracture: Secondary | ICD-10-CM | POA: Diagnosis not present

## 2024-02-14 LAB — CBC
HCT: 31.7 % — ABNORMAL LOW (ref 36.0–46.0)
Hemoglobin: 10.5 g/dL — ABNORMAL LOW (ref 12.0–15.0)
MCH: 30.4 pg (ref 26.0–34.0)
MCHC: 33.1 g/dL (ref 30.0–36.0)
MCV: 91.9 fL (ref 80.0–100.0)
Platelets: 161 K/uL (ref 150–400)
RBC: 3.45 MIL/uL — ABNORMAL LOW (ref 3.87–5.11)
RDW: 13.5 % (ref 11.5–15.5)
WBC: 9.3 K/uL (ref 4.0–10.5)
nRBC: 0 % (ref 0.0–0.2)

## 2024-02-14 LAB — BASIC METABOLIC PANEL WITH GFR
Anion gap: 8 (ref 5–15)
BUN: 31 mg/dL — ABNORMAL HIGH (ref 8–23)
CO2: 24 mmol/L (ref 22–32)
Calcium: 8.5 mg/dL — ABNORMAL LOW (ref 8.9–10.3)
Chloride: 111 mmol/L (ref 98–111)
Creatinine, Ser: 0.78 mg/dL (ref 0.44–1.00)
GFR, Estimated: >60 mL/min (ref >60)
Glucose, Bld: 126 mg/dL — ABNORMAL HIGH (ref 70–99)
Potassium: 4.1 mmol/L (ref 3.5–5.1)
Sodium: 143 mmol/L (ref 135–145)

## 2024-02-14 LAB — MAGNESIUM: Magnesium: 2 mg/dL (ref 1.7–2.4)

## 2024-02-14 LAB — PHOSPHORUS: Phosphorus: 2.7 mg/dL (ref 2.5–4.6)

## 2024-02-14 MED ORDER — SODIUM CHLORIDE 0.9 % IV SOLN: INTRAVENOUS | Status: AC

## 2024-02-14 MED ORDER — LACTULOSE 10 GM/15ML PO SOLN
20.0000 g | Freq: Every day | ORAL | Status: DC
  Administered 2024-02-14 – 2024-02-15 (×2): 20 g via ORAL
  Filled 2024-02-14 (×4): qty 30

## 2024-02-14 MED ORDER — TRAMADOL HCL 50 MG PO TABS: 50.0000 mg | ORAL_TABLET | Freq: Four times a day (QID) | ORAL | Status: DC | PRN

## 2024-02-14 NOTE — Care Management Important Message (Signed)
 Important Message  Patient Details  Name: Anne Oneill MRN: [id number] Date of Birth: 12/16/1926   Important Message Given:  Yes - Medicare IM     Juwan Vences W, CMA 02/14/2024, 2:25 PM

## 2024-02-14 NOTE — Progress Notes (Signed)
 Physical Therapy Treatment Patient Details Name: Anne Oneill MRN: [id number] DOB: 04-26-1927 Today's Date: 02/14/2024   History of Present Illness Anne Oneill is a 89 year old female with history of mixed dementia, hyperlipidemia, osteoporosis, osteopenia, who presents emergency department for chief concerns of unwitnessed fall from [place] memory care unit. Patient is s/p L hemiarthroplasty, posterior approach. WBAT.    PT Comments  Pt able to transfer from bed to chair with min x 2 for RW management and weight shift to take steps. Pt demonstrates posterior lean on bed frame and is able to correct with cuing. Pt is mod x 2 for bed mobility with trunk and LE management. Multi modal cuing for all functional mobility for sequencing. Pt is progressing well towards goal.     If plan is discharge home, recommend the following: A lot of help with walking and/or transfers;A lot of help with bathing/dressing/bathroom;Assistance with cooking/housework;Assistance with feeding;Assist for transportation;Supervision due to cognitive status   Can travel by private vehicle     No  Equipment Recommendations  None recommended by PT    Recommendations for Other Services       Precautions / Restrictions Precautions Precautions: Fall;Posterior Hip Precaution Booklet Issued: No Recall of Precautions/Restrictions: Impaired Restrictions Weight Bearing Restrictions Per Provider Order: Yes LLE Weight Bearing Per Provider Order: Weight bearing as tolerated     Mobility  Bed Mobility Overal bed mobility: Needs Assistance Bed Mobility: Supine to Sit     Supine to sit: Mod assist, +2 for physical assistance     General bed mobility comments: Multi modal cuing for sequencing and hand placement. Pt able to initate movement, but needs mod A for LE to get to EOB. Able to reach for hand rails    Transfers Overall transfer level: Needs assistance Equipment used: Rolling walker (2  wheels) Transfers: Sit to/from Stand, Bed to chair/wheelchair/BSC Sit to Stand: Mod assist, +2 physical assistance   Step pivot transfers: Min assist, +2 physical assistance       General transfer comment: Assistance for appropriate weight shift to step and balance. max cuing for sequencing. Upon standing, pt leans posteriorly on bed fram and is able to correct with cuing    Ambulation/Gait Ambulation/Gait assistance: Min assist, +2 physical assistance Gait Distance (Feet): 3 Feet Assistive device: Rolling walker (2 wheels) Gait Pattern/deviations: Step-to pattern       General Gait Details: steps taken to get to recliner   Stairs             Wheelchair Mobility     Tilt Bed    Modified Rankin (Stroke Patients Only)       Balance Overall balance assessment: Needs assistance Sitting-balance support: Feet supported, Bilateral upper extremity supported Sitting balance-Leahy Scale: Fair Sitting balance - Comments: Able to maintain balance sitting at EOB with hands on bed rail.   Standing balance support: Single extremity supported, During functional activity, Reliant on assistive device for balance Standing balance-Leahy Scale: Poor Standing balance comment: Posterior lean on bed frame upon standing, however pt is able to correct with cuing. No LOB                            Communication Communication Communication: No apparent difficulties  Cognition Arousal: Alert Behavior During Therapy: WFL for tasks assessed/performed   PT - Cognitive impairments: History of cognitive impairments  PT - Cognition Comments: Pt is very pleasant and agreeable to PT session Following commands: Impaired Following commands impaired: Follows one step commands with increased time    Cueing Cueing Techniques: Verbal cues, Visual cues  Exercises      General Comments        Pertinent Vitals/Pain Pain Assessment Pain Assessment:  No/denies pain    Home Living                          Prior Function            PT Goals (current goals can now be found in the care plan section) Acute Rehab PT Goals PT Goal Formulation: With family Time For Goal Achievement: 02/27/24 Potential to Achieve Goals: Fair Progress towards PT goals: Progressing toward goals    Frequency    Min 2X/week      PT Plan      Co-evaluation              AM-PAC PT 6 Clicks Mobility   Outcome Measure  Help needed turning from your back to your side while in a flat bed without using bedrails?: A Lot Help needed moving from lying on your back to sitting on the side of a flat bed without using bedrails?: A Lot Help needed moving to and from a bed to a chair (including a wheelchair)?: A Lot Help needed standing up from a chair using your arms (e.g., wheelchair or bedside chair)?: A Little Help needed to walk in hospital room?: A Little Help needed climbing 3-5 steps with a railing? : A Lot 6 Click Score: 14    End of Session Equipment Utilized During Treatment: Oxygen Activity Tolerance: Patient tolerated treatment well Patient left: in chair;with call bell/phone within reach;with chair alarm set Nurse Communication: Mobility status PT Visit Diagnosis: Other abnormalities of gait and mobility (R26.89);Muscle weakness (generalized) (M62.81)     Time: 9146-9089 PT Time Calculation (min) (ACUTE ONLY): 17 min  Charges:                            Anne Oneill, SPT    Anne Oneill 02/14/2024, 10:27 AM

## 2024-02-14 NOTE — Plan of Care (Signed)

## 2024-02-14 NOTE — Progress Notes (Signed)
 [facility] room 151 - [facility] Patient Visit   Anne Oneill is current hospice patient followed at [facility] for terminal diagnosis of cerebrovascular disease. Patient experienced a fall at her facility and was  admitted to [facility] with closed left femoral fracture.   Per Dr. Curlee - [facility], this is a related hospital admission.  Patient continues to work with PT.  No finalized discharge plan at this time.   Patient awake and alert.  Patient knows who she is- but doesn't seem to know anything else.  She doesn't complain of pain.  Patient is making good urine output via foley cath.  Patient is appropriate for GIP level of care requiring skilled PT and skilled nursing care post left hip repair.  Vital Signs:  T 98.5, BP 146/66, P 72, R 18, Oxi 96%  Abnormal Labs:   Glucose 126, BUN 31, Ca 8.5, RBC 3.46, HGB 10.5, HCT 31.7  IV/PRN Meds: None  Diagnostics:  No new  Assessment and Plan: per Dr. Landon Dibia progress note 9.5.25  Left femoral fracture, closed due to fall Continue as needed meds for pain control Orthopedic surgery consulted, s/p Left hip hemiarthroplasty  done on 9/3      Multiple closed fractures of cervical vertebrae  Seen by neurosurgery, recommended no surgical intervention, continue cervical collar remains out of bed. She should remove her collar to eat and when asleep.  She is cleared for hip surgery, recommended she stay in her collar for intubation and throughout the case.  She can then have her collar removed in the recovery area.    Mixed Alzheimer's and vascular dementia Home memantine  10 mg p.o. twice daily resumed on admission   Hypertensive urgency Home spironolactone  25 mg daily, metoprolol  succinate 50 mg daily were resumed on admission Secondary to pain with multiple cervical fractures and femur fracture Hydralazine  10 mg IV every 6 hr prn SBP >170   Hyperlipidemia Simvastatin  20 mg nightly resumed    Hypophosphatemia secondary to nutritional deficiency Phos repleted. Monitor electrolytes daily and replete as needed.   Metabolic acidosis, bicarbonate 50 mEq IV x 1 dose given on 9/4 Check BMP daily   Dysphagia SLP eval done, started on pured diet with nectar thin liquids. Medications crushed with pured. Continue IV fluid for hydration  Discharge Planning- ongoing-DC back to [facility] vs SNF rehab.   Family Contact- No answer. IDT: updated  Goals of Care: DNR   Please call with any hospice related questions.  [name], Rn Nurse Liaison [phone]

## 2024-02-14 NOTE — Progress Notes (Signed)
 Physical Therapy Treatment Patient Details Name: Anne Oneill MRN: [id number] DOB: 24-Feb-1927 Today's Date: 02/14/2024   History of Present Illness [name] is a 89 year old female with history of mixed dementia, hyperlipidemia, osteoporosis, osteopenia, who presents emergency department for chief concerns of unwitnessed fall from [place] memory care unit. Patient is s/p L hemiarthroplasty, posterior approach. WBAT.    PT Comments  Pt able to ambulate 12 ft around bed with min A/CGA x 2 for RW management and appropriate weight shift for stepping. Min A  to get back into bed for LE management. Would benefit from skilled PT to address above deficits and promote optimal return to PLOF.    If plan is discharge home, recommend the following: A little help with walking and/or transfers;A lot of help with bathing/dressing/bathroom;Assistance with cooking/housework;Supervision due to cognitive status;Assist for transportation;Assistance with feeding   Can travel by private vehicle     Yes  Equipment Recommendations  None recommended by PT    Recommendations for Other Services       Precautions / Restrictions Precautions Precautions: Fall;Posterior Hip Precaution Booklet Issued: No Recall of Precautions/Restrictions: Impaired Restrictions Weight Bearing Restrictions Per Provider Order: Yes LLE Weight Bearing Per Provider Order: Weight bearing as tolerated     Mobility  Bed Mobility Overal bed mobility: Needs Assistance Bed Mobility: Sit to Supine     Supine to sit: Mod assist, +2 for physical assistance Sit to supine: Min assist   General bed mobility comments: Cuing for sequencing. Assistance with LE management back into bed.    Transfers Overall transfer level: Needs assistance Equipment used: Rolling walker (2 wheels) Transfers: Sit to/from Stand, Bed to chair/wheelchair/BSC Sit to Stand: Min assist, +2 physical assistance   Step pivot transfers: Contact  guard assist, Min assist, +2 physical assistance       General transfer comment: Assitance for weight shift. Verbal cuing for sequencing    Ambulation/Gait Ambulation/Gait assistance: Min assist Gait Distance (Feet): 12 Feet Assistive device: Rolling walker (2 wheels) Gait Pattern/deviations: Step-to pattern, Decreased step length - right, Decreased weight shift to left Gait velocity: decreased     General Gait Details: step to pattern with ambulation around the bed. Min A with weight shift intermittently and RW management   Stairs             Wheelchair Mobility     Tilt Bed    Modified Rankin (Stroke Patients Only)       Balance Overall balance assessment: Needs assistance Sitting-balance support: Feet supported, Bilateral upper extremity supported Sitting balance-Leahy Scale: Fair Sitting balance - Comments: Able to maintain seated balance at EOB. Min A for posterior lean correction Postural control: Posterior lean Standing balance support: Bilateral upper extremity supported, During functional activity, Reliant on assistive device for balance Standing balance-Leahy Scale: Poor Standing balance comment: Heavy reliance on RW for balance. No LOB in standing. Pt pulls to stand on RW despite multimodal cuing                            Communication Communication Communication: No apparent difficulties  Cognition Arousal: Alert Behavior During Therapy: WFL for tasks assessed/performed   PT - Cognitive impairments: History of cognitive impairments                       PT - Cognition Comments: Pt is very pleasant and agreeable to PT session Following commands: Impaired Following commands impaired: Follows  one step commands with increased time    Cueing Cueing Techniques: Verbal cues, Visual cues  Exercises      General Comments        Pertinent Vitals/Pain Pain Assessment Pain Assessment: No/denies pain    Home Living                           Prior Function            PT Goals (current goals can now be found in the care plan section) Acute Rehab PT Goals PT Goal Formulation: With family Time For Goal Achievement: 02/27/24 Potential to Achieve Goals: Fair Progress towards PT goals: Progressing toward goals    Frequency    Min 2X/week      PT Plan      Co-evaluation              AM-PAC PT 6 Clicks Mobility   Outcome Measure  Help needed turning from your back to your side while in a flat bed without using bedrails?: A Lot Help needed moving from lying on your back to sitting on the side of a flat bed without using bedrails?: A Lot Help needed moving to and from a bed to a chair (including a wheelchair)?: A Little Help needed standing up from a chair using your arms (e.g., wheelchair or bedside chair)?: A Little Help needed to walk in hospital room?: A Little Help needed climbing 3-5 steps with a railing? : A Lot 6 Click Score: 15    End of Session Equipment Utilized During Treatment: Cervical collar Activity Tolerance: Patient tolerated treatment well Patient left: in bed;with call bell/phone within reach;with bed alarm set;with family/visitor present Nurse Communication: Mobility status PT Visit Diagnosis: Other abnormalities of gait and mobility (R26.89);Muscle weakness (generalized) (M62.81) Pain - Right/Left: Left Pain - part of body: Leg     Time: 8553-8498 PT Time Calculation (min) (ACUTE ONLY): 15 min  Charges:                            Zoeann Mol, SPT    Maniah Nading 02/14/2024, 3:31 PM

## 2024-02-14 NOTE — Progress Notes (Signed)
  Subjective: 2 Days Post-Op Procedure(s) (LRB): HEMIARTHROPLASTY (BIPOLAR) HIP, POSTERIOR APPROACH FOR FRACTURE (Left) Patient reports no pain in the left leg this morning.  Seems to be much more alert this morning. Patient is well, in C-collar due to cervical injury. Patient was in memory care unit prior to recent injury. Negative for chest pain and shortness of breath Fever: no Gastrointestinal:Negative for nausea and vomiting Continue to work on a BM.  Objective: Vital signs in last 24 hours: Temp:  [97.8 F (36.6 C)-99.1 F (37.3 C)] 97.8 F (36.6 C) (09/05 0745) Pulse Rate:  [72-121] 72 (09/05 0745) Resp:  [16-22] 16 (09/05 0745) BP: (139-187)/(58-78) 187/74 (09/05 0745) SpO2:  [92 %-98 %] 98 % (09/05 0745)  Intake/Output from previous day:  Intake/Output Summary (Last 24 hours) at 02/14/2024 0757 Last data filed at 02/14/2024 0700 Gross per 24 hour  Intake 1012.79 ml  Output 500 ml  Net 512.79 ml    Intake/Output this shift: No intake/output data recorded.  Labs: Recent Labs    02/11/24 1719 02/12/24 0349 02/13/24 0524 02/14/24 0620  HGB 13.9 13.2 12.5 10.5*   Recent Labs    02/13/24 0524 02/14/24 0620  WBC 12.6* 9.3  RBC 4.11 3.45*  HCT 37.2 31.7*  PLT 185 161   Recent Labs    02/12/24 0349 02/13/24 0524  NA 134* 138  K 3.9 4.2  CL 102 108  CO2 22 19*  BUN 20 23  CREATININE 0.68 0.94  GLUCOSE 130* 159*  CALCIUM  8.6* 9.2   No results for input(s): LABPT, INR in the last 72 hours.   EXAM General - Patient is Alert and Lacking but much more responsive this morning. Extremity - ABD soft Sensation intact distally Dorsiflexion/Plantar flexion intact Incision: Mild spotting to the left hip honeycomb dressing this morning. Compartment soft Dressing/Incision - Honeycomb dressing intact with mild spotting. Motor Function - intact, moving foot and toes well on exam. Abdomen soft with intact bowel sounds this morning.  Past Medical History:   Diagnosis Date   Arthritis    Cancer (HCC)    Dementia (HCC)    mild   DVT (deep venous thrombosis) (HCC)    High cholesterol    Hyperlipemia    Osteoporosis    Squamous cell carcinoma of skin 02/19/2007   R cheek    Assessment/Plan: 2 Days Post-Op Procedure(s) (LRB): HEMIARTHROPLASTY (BIPOLAR) HIP, POSTERIOR APPROACH FOR FRACTURE (Left) Principal Problem:   Closed left femoral fracture (HCC) Active Problems:   History of breast cancer   History of TIA (transient ischemic attack)   Hyperlipidemia   Mixed Alzheimer's and vascular dementia (HCC)   Osteoporosis   Hypertensive urgency   Thrombocytopenia (HCC)   Multiple closed fractures of cervical vertebrae (HCC)   Closed fracture of odontoid process of axis (HCC)  Estimated body mass index is 18.51 kg/m as calculated from the following:   Height as of this encounter: 5' (1.524 m).   Weight as of this encounter: 43 kg. Up with therapy D/C IV fluids when tolerating PO intake.  Labs and vitals reviewed.  WBC down to 9.3. Hg 10.5. Continue to monitor. Up with therapy as much as can tolerated today. Continue to work on a BM. History of dementia, denies pain this AM.  Much more alert this AM.  DVT Prophylaxis - Lovenox  and TED hose Weight-Bearing as tolerated to left leg  J. Gustavo Level, PA-C [facility] Orthopaedic Surgery 02/14/2024, 7:57 AM

## 2024-02-14 NOTE — Progress Notes (Signed)
 Progress Note   Patient: Anne Oneill FMW:[id number] DOB: [birth year] DOA: 02/11/2024     3 DOS: the patient was seen and examined on 02/14/2024   Brief hospital course: Anne Oneill is a 89 year old female with history of mixed dementia, hyperlipidemia, osteoporosis, osteopenia, who presents emergency department for chief concerns of unwitnessed fall from [place] memory care unit.  Vitals in the ED showed temperature of 99.6, respiration rate 17, heart rate 79, blood pressure 208/98, SpO2 of 94% on 4 L nasal cannula.  Serum sodium is 137, potassium 4.4, chloride 103, bicarb 23, BUN of 29, serum creatinine of 0.77, EGFR greater than 60, nonfasting blood glucose 126, WBC 7.9, hemoglobin 13.9, platelet 211.  ED treatment: Morphine  4 mg IV one-time dose, ondansetron  4 mg IV one-time dose, sodium chloride  500 mL liter bolus.  EDP discussed case with Dr. Katrina, who states he will see the patient and family to discuss his recommendations.  EDP also consulted orthopedic surgeon, Dr. Edie will see the patient.  Assessment and Plan: # Left femoral fracture, closed due to fall Continue as needed meds for pain control Orthopedic surgery consulted, s/p Left hip hemiarthroplasty  done on 9/3     # Multiple closed fractures of cervical vertebrae  Seen by neurosurgery, recommended no surgical intervention, continue cervical collar remains out of bed. She should remove her collar to eat and when asleep.  She is cleared for hip surgery, recommended she stay in her collar for intubation and throughout the case.  She can then have her collar removed in the recovery area.    Mixed Alzheimer's and vascular dementia Home memantine  10 mg p.o. twice daily resumed on admission   Hypertensive urgency Home spironolactone  25 mg daily, metoprolol  succinate 50 mg daily were resumed on admission Secondary to pain with multiple cervical fractures and femur fracture Hydralazine  10 mg IV every 6 hr prn  SBP >170   Hyperlipidemia Simvastatin  20 mg nightly resumed   Hypophosphatemia secondary to nutritional deficiency Phos repleted. Monitor electrolytes daily and replete as needed.   Metabolic acidosis, bicarbonate 50 mEq IV x 1 dose given on 9/4 Check BMP daily   Dysphagia SLP eval done, started on pured diet with nectar thin liquids. Medications crushed with pured. Continue IV fluid for hydration     Body mass index is 18.51 kg/m.  Interventions:   Diet: Pured with nectar thick liquids DVT Prophylaxis: Subcutaneous Lovenox     Advance goals of care discussion: DNR-Limited   Family Communication: Family was present at bedside, at the time of interview.  The pt provided permission to discuss medical plan with the family. Opportunity was given to ask question and all questions were answered satisfactorily.    Disposition:  Pt is from [place] [facility] under [facility] care, admitted with C-spine abd Left femoral fx, s/p left hip hemiarthroplasty on 9/3, which precludes a safe discharge. Discharge back the facility, when stable and cleared by [facility].     Subjective: No acute complaints  She states she has not had  BM since surgery. Denies abd pain, passing gas.  Physical Exam: Vitals:   02/14/24 0220 02/14/24 0516 02/14/24 0745 02/14/24 1310  BP: (!) 146/66 (!) 157/68 (!) 187/74 (!) 153/74  Pulse: 72 73 72 78  Resp: 18 18 16    Temp: 98.5 F (36.9 C) 98.1 F (36.7 C) 97.8 F (36.6 C)   TempSrc:   Oral   SpO2: 96% 98% 98% 96%  Weight:      Height:  General: NAD, lying comfortably Appear in no distress, affect appropriate Eyes: PERRLA ENT: Neck collar intact Neck: no JVD,  Cardiovascular: S1 and S2 Present, no Murmur,  Respiratory: good respiratory effort, Bilateral Air entry equal and Decreased, no Crackles, no wheezes Abdomen: Bowel Sound present, Soft and no tenderness,  Skin: no rashes Extremities: s/p Left hemiarthroplasty, dressing CDI.   no  significant Pedal edema Neurologic: without any new focal findings Gait not checked due to patient safety concerns  Data Reviewed: I have personally reviewed and interpreted daily labs, tele strips, imagings as discussed above. I reviewed all nursing notes, pharmacy notes, vitals, pertinent old records I have discussed plan of care as described above with RN and patient/family.      Disposition: Status is: Inpatient Remains inpatient appropriate   Planned Discharge Destination: Skilled nursing facility    Time spent: 35 minutes  Author: Landon FORBES Baller, MD 02/14/2024 2:39 PM  For on call review [email or web address].

## 2024-02-15 DIAGNOSIS — M8000XS Age-related osteoporosis with current pathological fracture, unspecified site, sequela: Secondary | ICD-10-CM | POA: Diagnosis not present

## 2024-02-15 DIAGNOSIS — S129XXD Fracture of neck, unspecified, subsequent encounter: Secondary | ICD-10-CM | POA: Diagnosis not present

## 2024-02-15 DIAGNOSIS — S72042A Displaced fracture of base of neck of left femur, initial encounter for closed fracture: Secondary | ICD-10-CM | POA: Diagnosis not present

## 2024-02-15 DIAGNOSIS — G309 Alzheimer's disease, unspecified: Secondary | ICD-10-CM | POA: Diagnosis not present

## 2024-02-15 LAB — CBC
HCT: 31.1 % — ABNORMAL LOW (ref 36.0–46.0)
Hemoglobin: 10.3 g/dL — ABNORMAL LOW (ref 12.0–15.0)
MCH: 30.2 pg (ref 26.0–34.0)
MCHC: 33.1 g/dL (ref 30.0–36.0)
MCV: 91.2 fL (ref 80.0–100.0)
Platelets: 167 K/uL (ref 150–400)
RBC: 3.41 MIL/uL — ABNORMAL LOW (ref 3.87–5.11)
RDW: 13.4 % (ref 11.5–15.5)
WBC: 8.6 K/uL (ref 4.0–10.5)
nRBC: 0 % (ref 0.0–0.2)

## 2024-02-15 LAB — BASIC METABOLIC PANEL WITH GFR
Anion gap: 8 (ref 5–15)
BUN: 30 mg/dL — ABNORMAL HIGH (ref 8–23)
CO2: 22 mmol/L (ref 22–32)
Calcium: 8.4 mg/dL — ABNORMAL LOW (ref 8.9–10.3)
Chloride: 110 mmol/L (ref 98–111)
Creatinine, Ser: 0.6 mg/dL (ref 0.44–1.00)
GFR, Estimated: >60 mL/min (ref >60)
Glucose, Bld: 119 mg/dL — ABNORMAL HIGH (ref 70–99)
Potassium: 4 mmol/L (ref 3.5–5.1)
Sodium: 140 mmol/L (ref 135–145)

## 2024-02-15 LAB — PHOSPHORUS: Phosphorus: 1.8 mg/dL — ABNORMAL LOW (ref 2.5–4.6)

## 2024-02-15 LAB — MAGNESIUM: Magnesium: 2.1 mg/dL (ref 1.7–2.4)

## 2024-02-15 MED ORDER — SODIUM PHOSPHATES 45 MMOLE/15ML IV SOLN
30.0000 mmol | Freq: Once | INTRAVENOUS | Status: AC
  Administered 2024-02-15: 30 mmol via INTRAVENOUS
  Filled 2024-02-15: qty 10

## 2024-02-15 MED ORDER — SENNOSIDES-DOCUSATE SODIUM 8.6-50 MG PO TABS
1.0000 | ORAL_TABLET | Freq: Two times a day (BID) | ORAL | Status: DC
  Administered 2024-02-15 – 2024-02-17 (×5): 1 via ORAL
  Filled 2024-02-15 (×6): qty 1

## 2024-02-15 MED ORDER — POLYETHYLENE GLYCOL 3350 17 G PO PACK
17.0000 g | PACK | Freq: Every day | ORAL | Status: DC
  Administered 2024-02-15 – 2024-02-17 (×3): 17 g via ORAL
  Filled 2024-02-15 (×3): qty 1

## 2024-02-15 MED ORDER — SENNOSIDES-DOCUSATE SODIUM 8.6-50 MG PO TABS: 1.0000 | ORAL_TABLET | Freq: Every evening | ORAL | Status: DC | PRN

## 2024-02-15 MED ORDER — BISACODYL 10 MG RE SUPP: 10.0000 mg | Freq: Every day | RECTAL | Status: DC | PRN

## 2024-02-15 MED ORDER — SODIUM CHLORIDE 0.9 % IV SOLN: INTRAVENOUS | Status: AC

## 2024-02-15 NOTE — Progress Notes (Signed)
 Progress Note   Patient: Anne Oneill FMW:[id number] DOB: December 07, 1926 DOA: 02/11/2024     4 DOS: the patient was seen and examined on 02/15/2024   Brief hospital course: [name] is a 89 year old female with history of mixed dementia, hyperlipidemia, osteoporosis, osteopenia, who presents emergency department for chief concerns of unwitnessed fall from [place] memory care unit.  Vitals in the ED showed temperature of 99.6, respiration rate 17, heart rate 79, blood pressure 208/98, SpO2 of 94% on 4 L nasal cannula.  Serum sodium is 137, potassium 4.4, chloride 103, bicarb 23, BUN of 29, serum creatinine of 0.77, EGFR greater than 60, nonfasting blood glucose 126, WBC 7.9, hemoglobin 13.9, platelet 211.  ED treatment: Morphine  4 mg IV one-time dose, ondansetron  4 mg IV one-time dose, sodium chloride  500 mL liter bolus.  EDP discussed case with Dr. Katrina, who states he will see the patient and family to discuss his recommendations.  EDP also consulted orthopedic surgeon, Dr. Edie will see the patient.  Assessment and Plan: # Left femoral fracture, closed due to fall Continue as needed meds for pain control Orthopedic surgery consulted, s/p Left hip hemiarthroplasty  done on 9/3     # Multiple closed fractures of cervical vertebrae  Seen by neurosurgery, recommended no surgical intervention, continue cervical collar remains out of bed. She should remove her collar to eat and when asleep.  She is cleared for hip surgery, recommended she stay in her collar for intubation and throughout the case.  She can then have her collar removed in the recovery area.    Mixed Alzheimer's and vascular dementia Home memantine  10 mg p.o. twice daily resumed on admission   Hypertensive urgency Home spironolactone  25 mg daily, metoprolol  succinate 50 mg daily were resumed on admission Secondary to pain with multiple cervical fractures and femur fracture Hydralazine  10 mg IV every 6 hr prn  SBP >170   Hyperlipidemia Simvastatin  20 mg nightly resumed   Hypophosphatemia secondary to nutritional deficiency Phos repleted. Monitor electrolytes daily and replete as needed.   Metabolic acidosis, bicarbonate 50 mEq IV x 1 dose given on 9/4 Check BMP daily   Dysphagia SLP eval done, started on pured diet with nectar thin liquids. Medications crushed with pured. Continue IV fluid for hydration     Body mass index is 18.51 kg/m.  Interventions:   Diet: Pured with nectar thick liquids DVT Prophylaxis: Subcutaneous Lovenox     Advance goals of care discussion: DNR-Limited   Family Communication: Family was present at bedside, at the time of interview.  The pt provided permission to discuss medical plan with the family. Opportunity was given to ask question and all questions were answered satisfactorily.    Disposition:  Pt is from [place] [facility] under [facility] care, admitted with C-spine abd Left femoral fx, s/p left hip hemiarthroplasty on 9/3, which precludes a safe discharge. Discharge back the facility, when stable and cleared by [facility].     Subjective: No acute complaints  She states she has not had  BM since surgery. Denies abd pain, passing gas.  Physical Exam: Vitals:   02/14/24 1659 02/14/24 2013 02/15/24 0424 02/15/24 0733  BP: (!) 158/67 (!) 165/77 (!) 192/75 (!) 183/77  Pulse: 73 69 66 68  Resp: 18 17 18 16   Temp: 98.8 F (37.1 C) 98.1 F (36.7 C) 97.8 F (36.6 C) 98.2 F (36.8 C)  TempSrc: Oral Oral Oral   SpO2: 96% 95% 96% 97%  Weight:  Height:      General: NAD, lying comfortably Appear in no distress, affect appropriate Eyes: PERRLA ENT: Neck collar intact Neck: no JVD,  Cardiovascular: S1 and S2 Present, no Murmur,  Respiratory: good respiratory effort, Bilateral Air entry equal and Decreased, no Crackles, no wheezes Abdomen: Bowel Sound present, Soft and no tenderness,  Skin: no rashes Extremities: s/p Left  hemiarthroplasty, dressing CDI.   no significant Pedal edema Neurologic: without any new focal findings Gait not checked due to patient safety concerns  Data Reviewed: I have personally reviewed and interpreted daily labs, tele strips, imagings as discussed above. I reviewed all nursing notes, pharmacy notes, vitals, pertinent old records I have discussed plan of care as described above with RN and patient/family.      Disposition: Status is: Inpatient Remains inpatient appropriate   Planned Discharge Destination: Skilled nursing facility    Time spent: 35 minutes  Author: Landon FORBES Baller, MD 02/15/2024 8:46 AM  For on call review [email or web address].

## 2024-02-15 NOTE — NC FL2 (Signed)
 [place]  MEDICAID FL2 LEVEL OF CARE FORM     IDENTIFICATION  Patient Name: Anne Oneill Birthdate: 12/04/1926 Sex: female Admission Date (Current Location): 02/11/2024  [facility] and [place] Number:  Chiropodist and Address:  [facility], [address] [place], [place] [address]      Provider Number: [id number]  Attending Physician Name and Address:  Dibia, Landon BRAVO, MD  Relative Name and Phone Number:  VIVIAN JANSKY (Daughter)  [phone] [facility])    Current Level of Care: Hospital Recommended Level of Care: Skilled Nursing Facility Prior Approval Number:    Date Approved/Denied:   PASRR Number: [id number]  Discharge Plan:      Current Diagnoses: Patient Active Problem List   Diagnosis Date Noted   Closed left femoral fracture (HCC) 02/11/2024   Multiple closed fractures of cervical vertebrae (HCC) 02/11/2024   Closed fracture of odontoid process of axis (HCC) 02/11/2024   Suprapubic pain 02/26/2022   Drop in hemoglobin 02/25/2022   Thrombocytopenia (HCC) 02/25/2022   Impaired fasting glucose 02/25/2022   Pelvic ring fracture, sequela 02/24/2022   Sacral fracture, closed (HCC) 02/24/2022   Hypertensive urgency 02/23/2022   Dyspepsia 01/04/2020   Hiatal hernia 01/04/2020   History of breast cancer 01/04/2020   History of DVT (deep vein thrombosis) 01/04/2020   History of TIA (transient ischemic attack) 01/04/2020   Hyperlipidemia 01/04/2020   Mitral valve prolapse 01/04/2020   Osteoporosis 01/04/2020   Mixed Alzheimer's and vascular dementia (HCC) 09/06/2019   Meningioma (HCC) 02/11/2018    Orientation RESPIRATION BLADDER Height & Weight     Self  Normal Indwelling catheter Weight: 94 lb 12.8 oz (43 kg) Height:  5' (152.4 cm)  BEHAVIORAL SYMPTOMS/MOOD NEUROLOGICAL BOWEL NUTRITION STATUS      Continent Diet (dys 2)  AMBULATORY STATUS COMMUNICATION OF NEEDS Skin   Extensive Assist Verbally Surgical wounds (left  hip)                       Personal Care Assistance Level of Assistance  Bathing, Feeding, Dressing Bathing Assistance: Maximum assistance Feeding assistance: Limited assistance Dressing Assistance: Maximum assistance     Functional Limitations Info             SPECIAL CARE FACTORS FREQUENCY  PT (By licensed PT), OT (By licensed OT)     PT Frequency: 5 times per week OT Frequency: 5 times per week            Contractures      Additional Factors Info  Code Status, Allergies Code Status Info: Do not attempt resuscitation (DNR) -DNR-LIMITED -Do Not Intubate/DNI Allergies Info: Codeine, Ditropan (Oxybutynin), Hydrocodone, Morphine , Latex           Current Medications (02/15/2024):  This is the current hospital active medication list Current Facility-Administered Medications  Medication Dose Route Frequency Provider Last Rate Last Admin   acetaminophen  (TYLENOL ) tablet 650 mg  650 mg Oral Q6H PRN Von Bellis, MD   650 mg at 02/14/24 2148   Or   acetaminophen  (TYLENOL ) suppository 650 mg  650 mg Rectal Q6H PRN Von Bellis, MD       bisacodyl  (DULCOLAX) suppository 10 mg  10 mg Rectal Daily PRN Hooten, Lynwood SQUIBB, MD       Chlorhexidine  Gluconate Cloth 2 % PADS 6 each  6 each Topical Daily Von Bellis, MD   6 each at 02/15/24 1124   cyanocobalamin  (VITAMIN B12) tablet 1,000 mcg  1,000 mcg Oral  Daily Cox, Amy N, DO   1,000 mcg at 02/15/24 1059   enoxaparin  (LOVENOX ) injection 30 mg  30 mg Subcutaneous QPM Von Bellis, MD   30 mg at 02/14/24 1836   escitalopram  (LEXAPRO ) tablet 10 mg  10 mg Oral Daily Cox, Amy N, DO   10 mg at 02/15/24 1059   feeding supplement (NEPRO CARB STEADY) liquid 237 mL  237 mL Oral BID BM Von Bellis, MD   237 mL at 02/14/24 1005   fentaNYL  (SUBLIMAZE ) injection 25 mcg  25 mcg Intravenous Q4H PRN Von Bellis, MD       hydrALAZINE  (APRESOLINE ) injection 10 mg  10 mg Intravenous Q6H PRN Von Bellis, MD   10 mg at 02/13/24 0234    labetalol  (NORMODYNE ) injection 10 mg  10 mg Intravenous Q4H PRN Von Bellis, MD       lactulose  (CHRONULAC ) 10 GM/15ML solution 20 g  20 g Oral Daily Dibia, Pauline E, MD   20 g at 02/15/24 1059   metoprolol  succinate (TOPROL -XL) 24 hr tablet 50 mg  50 mg Oral Daily Cox, Amy N, DO   50 mg at 02/15/24 1059   multivitamin with minerals tablet 1 tablet  1 tablet Oral Daily Von Bellis, MD   1 tablet at 02/15/24 1059   ondansetron  (ZOFRAN ) tablet 4 mg  4 mg Oral Q6H PRN Von Bellis, MD       Or   ondansetron  (ZOFRAN ) injection 4 mg  4 mg Intravenous Q6H PRN Von Bellis, MD   4 mg at 02/15/24 1256   oxyCODONE  (Oxy IR/ROXICODONE ) immediate release tablet 5 mg  5 mg Oral Q6H PRN Von Bellis, MD   5 mg at 02/15/24 0412   polyethylene glycol (MIRALAX  / GLYCOLAX ) packet 17 g  17 g Oral Daily Dibia, Pauline E, MD   17 g at 02/15/24 1059   senna-docusate (Senokot-S) tablet 1 tablet  1 tablet Oral BID Hooten, James P, MD   1 tablet at 02/15/24 1059   simvastatin  (ZOCOR ) tablet 20 mg  20 mg Oral QHS Cox, Amy N, DO   20 mg at 02/14/24 2146   sodium phosphate  30 mmol in sodium chloride  0.9 % 250 mL infusion  30 mmol Intravenous Once Dibia, Pauline E, MD 43 mL/hr at 02/15/24 1258 30 mmol at 02/15/24 1258   spironolactone  (ALDACTONE ) tablet 25 mg  25 mg Oral Daily Von Bellis, MD   25 mg at 02/15/24 1059   traMADol  (ULTRAM ) tablet 50 mg  50 mg Oral Q6H PRN Dibia, Pauline E, MD         Discharge Medications: Please see discharge summary for a list of discharge medications.  Relevant Imaging Results:  Relevant Lab Results:   Additional Information SS #: [id number]  Teagen Mcleary E Nygeria Lager, LCSW

## 2024-02-15 NOTE — Progress Notes (Addendum)
 PT Cancellation Note  Patient Details Name: Anne Oneill MRN: [id number] DOB: 10-Feb-1927   Cancelled Treatment:     PT attempt. Pt very engaged in TV program upon arrival. Supportive son at bedside. Pt requested not to participate at this time while she is enjoying my program. Will return later as requested.   1359: Author returned however pt sound asleep. Untouched lunch tray at bedside. Will return at a more appropriate time/date. Pt to lethargic to safely participate at this time. [place] KATHEE Essex 02/15/2024, 12:00 PM

## 2024-02-15 NOTE — TOC Initial Note (Signed)
 Transition of Care [facility]) - Initial/Assessment Note    Patient Details  Name: Anne Oneill MRN: [id number] Date of Birth: [birth year]  [facility]) CM/SW Contact:    Mane Consolo E Watson Robarge, LCSW Phone Number: 02/15/2024, 2:33 PM    Clinical Narrative:                 CSW spoke with patient's [name] by phone. Patient lives at [facility] in their Memory Care Unit. [name] states at baseline, patient is able to walk short distances with a rolling walker. Patient is an active [facility] patient as well.  Therapy is recommending SNF for STR. [name] discussed being unsure if they want to pursue STR, she feels patient would do better returning to [facility] since it is a familiar environment for her, but would be ok with STR as well if that is needed. She understands that patient would likely have to revoke hospice to go to SNF for STR. She states patient does not wander or have aggressive behaviors (explained that most SNFs do not have memory care units). Explained that [facility] will likely need to come assess patient to see if they can meet her needs without patient going to STR first. CSW has tried to reach out to multiple contacts at [facility] today as well as their main number, unable to reach. [name] provided # for patient's nurse at [facility].   CSW called [facility], explained daughter is trying to decide about STR. Provided update from therapy session yesterday. Justice states they would like patient to go to STR prior to returning to [facility].  Updated [name]. [name] is agreeable to STR. Top preferences are [facility] and [facility].      Expected Discharge Plan: Skilled Nursing Facility Barriers to Discharge: Continued Medical Work up   Patient Goals and CMS Choice   CMS Medicare.gov Compare Post Acute Care list provided to:: Patient Represenative (must comment) Choice offered to / list presented to : Adult Children      Expected Discharge  Plan and Services       Living arrangements for the past 2 months: Assisted Living Facility                                      Prior Living Arrangements/Services Living arrangements for the past 2 months: Assisted Living Facility Lives with:: Facility Resident Patient language and need for interpreter reviewed:: Yes Do you feel safe going back to the place where you live?: Yes      Need for Family Participation in Patient Care: Yes (Comment) Care giver support system in place?: Yes (comment) Current home services: DME Criminal Activity/Legal Involvement Pertinent to Current Situation/Hospitalization: No - Comment as needed  Activities of Daily Living      Permission Sought/Granted Permission sought to share information with : Oceanographer granted to share information with : Yes, Verbal Permission Granted     Permission granted to share info w AGENCY: [facility], SNFs        Emotional Assessment       Orientation: : Fluctuating Orientation (Suspected and/or reported Sundowners) Alcohol / Substance Use: Not Applicable Psych Involvement: No (comment)  Admission diagnosis:  Closed left femoral fracture (HCC) [S72.92XA] Closed left hip fracture, initial encounter (HCC) [S72.002A] Severe dementia, unspecified dementia type, unspecified whether behavioral, psychotic, or mood disturbance or anxiety (HCC) [F03.C0] Patient Active Problem List  Diagnosis Date Noted   Closed left femoral fracture (HCC) 02/11/2024   Multiple closed fractures of cervical vertebrae (HCC) 02/11/2024   Closed fracture of odontoid process of axis (HCC) 02/11/2024   Suprapubic pain 02/26/2022   Drop in hemoglobin 02/25/2022   Thrombocytopenia (HCC) 02/25/2022   Impaired fasting glucose 02/25/2022   Pelvic ring fracture, sequela 02/24/2022   Sacral fracture, closed (HCC) 02/24/2022   Hypertensive urgency 02/23/2022   Dyspepsia 01/04/2020   Hiatal  hernia 01/04/2020   History of breast cancer 01/04/2020   History of DVT (deep vein thrombosis) 01/04/2020   History of TIA (transient ischemic attack) 01/04/2020   Hyperlipidemia 01/04/2020   Mitral valve prolapse 01/04/2020   Osteoporosis 01/04/2020   Mixed Alzheimer's and vascular dementia (HCC) 09/06/2019   Meningioma (HCC) 02/11/2018   PCP:  [facility] Pharmacy:   [id number] [place], [place] - [address] AT [facility] OF [address] [place] [address] Phone: [phone] Fax: [phone]     Social Drivers of Health (SDOH) Social History: SDOH Screenings   Food Insecurity: No Food Insecurity (02/13/2024)  Housing: Low Risk  (02/13/2024)  Transportation Needs: No Transportation Needs (02/13/2024)  Utilities: Not At Risk (02/13/2024)  Social Connections: Unknown (02/13/2024)  Tobacco Use: Low Risk  (02/12/2024)   SDOH Interventions:     Readmission Risk Interventions     No data to display

## 2024-02-15 NOTE — Plan of Care (Signed)

## 2024-02-15 NOTE — Plan of Care (Signed)
  Problem: Clinical Measurements: Goal: Will remain free from infection Outcome: Progressing   Problem: Activity: Goal: Risk for activity intolerance will decrease Outcome: Progressing   Problem: Coping: Goal: Level of anxiety will decrease Outcome: Progressing

## 2024-02-15 NOTE — Progress Notes (Signed)
 [facility] room 151 - [facility] Patient Visit   Anne Oneill is current hospice patient followed at [facility] for terminal diagnosis of cerebrovascular disease. Patient experienced a fall at her facility and was  admitted to [facility] with closed left femoral fracture.   Per Dr. Curlee - [facility], this is a related hospital admission.   Patient continues to work with PT.  PT recommending rehab.  Spoke with patient's daughter, Anne Oneill today to discuss hospice benefit and short term rehab under Medicare.  She is leaning towards STR and then back to [place] after rehab.  We will see what bed offers come in- and will plan on signing revocation paperwork tomorrow.  Patient is appropriate for GIP level of care requiring skilled PT and skilled nursing care post left hip repair.   Vital Signs:   T 98.2, BP 183/77, P 68, R 16, Oxi 97% RA   Abnormal Labs:  Glucose 119, BUN 30, Ca 8.4, Phos 1.8   IV/PRN Meds: None   Diagnostics:  No new   Assessment and Plan:    Assessment and Plan: Anne Baller MD progress report 9.6.25  Left femoral fracture, closed due to fall Continue as needed meds for pain control Orthopedic surgery consulted, s/p Left hip hemiarthroplasty  done on 9/3   Multiple closed fractures of cervical vertebrae  Seen by neurosurgery, recommended no surgical intervention, continue cervical collar remains out of bed. She should remove her collar to eat and when asleep.  She is cleared for hip surgery, recommended she stay in her collar for intubation and throughout the case.  She can then have her collar removed in the recovery area.    Mixed Alzheimer's and vascular dementia Home memantine  10 mg p.o. twice daily resumed on admission   Hypertensive urgency Home spironolactone  25 mg daily, metoprolol  succinate 50 mg daily were resumed on admission Secondary to pain with multiple cervical fractures and femur fracture Hydralazine  10  mg IV every 6 hr prn SBP >170   Hyperlipidemia Simvastatin  20 mg nightly resumed   Hypophosphatemia secondary to nutritional deficiency Phos repleted. Monitor electrolytes daily and replete as needed.   Metabolic acidosis, bicarbonate 50 mEq IV x 1 dose given on 9/4 Check BMP daily   Dysphagia SLP eval done, started on pured diet with nectar thin liquids. Medications crushed with pured. Continue IV fluid for hydration   Body mass index is 18.51 kg/m.  Interventions:   Diet: Pured with nectar thick liquids DVT Prophylaxis: Subcutaneous Lovenox     Advance goals of care discussion: DNR-Limited   Discharge Plan-  Likely STR- ongoing discussions Goals of care- clear Family Contact-  daughter Anne Oneill IDT- Updated  Please call with any hospice related questions or concerns.  Anne Oneill Na, RN Nurse Liaison [phone]

## 2024-02-15 NOTE — Progress Notes (Addendum)
 Subjective: 3 Days Post-Op Procedure(s) (LRB): HEMIARTHROPLASTY (BIPOLAR) HIP, POSTERIOR APPROACH FOR FRACTURE (Left) Patient reports no pain in the left leg this morning.  Seems to be much more alert this morning. Son present during visit Patient is well out of C-collar this AM. Patient was in memory care unit prior to recent injury. Negative for chest pain and shortness of breath Fever: no Gastrointestinal:Negative for nausea and vomiting Continue to work on a BM.  Objective: Vital signs in last 24 hours: Temp:  [97.8 F (36.6 C)-98.8 F (37.1 C)] 98.2 F (36.8 C) (09/06 0733) Pulse Rate:  [66-78] 68 (09/06 0733) Resp:  [16-18] 16 (09/06 0733) BP: (153-192)/(67-77) 183/77 (09/06 0733) SpO2:  [95 %-97 %] 97 % (09/06 0733)  Intake/Output from previous day:  Intake/Output Summary (Last 24 hours) at 02/15/2024 0909 Last data filed at 02/15/2024 0600 Gross per 24 hour  Intake 990.12 ml  Output 725 ml  Net 265.12 ml    Intake/Output this shift: No intake/output data recorded.  Labs: Recent Labs    02/13/24 0524 02/14/24 0620 02/15/24 0557  HGB 12.5 10.5* 10.3*   Recent Labs    02/14/24 0620 02/15/24 0557  WBC 9.3 8.6  RBC 3.45* 3.41*  HCT 31.7* 31.1*  PLT 161 167   Recent Labs    02/14/24 0620 02/15/24 0557  NA 143 140  K 4.1 4.0  CL 111 110  CO2 24 22  BUN 31* 30*  CREATININE 0.78 0.60  GLUCOSE 126* 119*  CALCIUM  8.5* 8.4*   No results for input(s): LABPT, INR in the last 72 hours.   EXAM General - Patient is Alert and Lacking but much more responsive this morning. Extremity - ABD soft Sensation intact distally Dorsiflexion/Plantar flexion intact Incision: Mild spotting to the left hip honeycomb dressing this morning. Compartment soft Dressing/Incision - Honeycomb dressing intact with mild spotting. Motor Function - intact, moving foot and toes well on exam. Abdomen soft with intact bowel sounds this morning.  Past Medical History:   Diagnosis Date   Arthritis    Cancer (HCC)    Dementia (HCC)    mild   DVT (deep venous thrombosis) (HCC)    High cholesterol    Hyperlipemia    Osteoporosis    Squamous cell carcinoma of skin 02/19/2007   R cheek    Assessment/Plan: 3 Days Post-Op Procedure(s) (LRB): HEMIARTHROPLASTY (BIPOLAR) HIP, POSTERIOR APPROACH FOR FRACTURE (Left) Principal Problem:   Closed left femoral fracture (HCC) Active Problems:   History of breast cancer   History of TIA (transient ischemic attack)   Hyperlipidemia   Mixed Alzheimer's and vascular dementia (HCC)   Osteoporosis   Hypertensive urgency   Thrombocytopenia (HCC)   Multiple closed fractures of cervical vertebrae (HCC)   Closed fracture of odontoid process of axis (HCC)  Estimated body mass index is 18.51 kg/m as calculated from the following:   Height as of this encounter: 5' (1.524 m).   Weight as of this encounter: 43 kg. Up with therapy D/C IV fluids when tolerating PO intake.  Labs and vitals reviewed.  WBC down to 8.6. Hg 10.3. Continue to monitor. Up with therapy as much as can tolerated today. Continue to work on a BM - senna, lactulose , miralax  and suppository ordered History of dementia, denies pain this AM.  Much more alert this AM.  DVT Prophylaxis - Lovenox  and TED hose Weight-Bearing as tolerated to left leg  Fonda CHARLENA Koyanagi, PA-C [facility] Orthopaedic Surgery 02/15/2024, 9:09 AM

## 2024-02-15 NOTE — Progress Notes (Signed)
 Speech Language Pathology Treatment: Dysphagia  Patient Details Name: Anne Oneill MRN: [id number] DOB: 25-Jun-1926 Today's Date: 02/15/2024 Time: 9059-9041 SLP Time Calculation (min) (ACUTE ONLY): 18 min  Assessment / Plan / Recommendation Clinical Impression  Pt seen for follow up dysphagia intervention. Pt with much improved alertness this date, as indicated by increased eye contact, verbal responses, and ability to self feed with set up. Pt's son present and reported steady improvement in the last two days. Pt seen with trials of regular solids and thin liquids. No overt or subtle s/sx pharyngeal dysphagia noted. No change to vocal quality across trials. Mastication/clearance for regular solids was notably prolonged with verbal cues required for redirection to task. Liquid wash provided for complete clearance.   Recommend Dys 2 chopped and thin liquids. Continue with aspiration precautions in the setting of continued debility and baseline dementia. Education shared with pt/son regarding recommendations and rationale. All reported understanding. RN and MD aware of recommendations. SLP will continue to follow.    HPI HPI: Per H&P, Anne Oneill is a 89 year old female with history of mixed dementia, hyperlipidemia, osteoporosis, osteopenia, who presents emergency department for chief concerns of unwitnessed fall from [place] memory care unit.      SLP Plan  Continue with current plan of care          Recommendations  Diet recommendations: Dysphagia 2 (fine chop);Thin liquid Liquids provided via: Cup;Straw Medication Administration: Whole meds with puree Supervision: Staff to assist with self feeding;Full supervision/cueing for compensatory strategies Compensations: Minimize environmental distractions;Slow rate;Small sips/bites Postural Changes and/or Swallow Maneuvers: Seated upright 90 degrees;Upright 30-60 min after meal;Out of bed for meals                   Staff/trained caregiver to provide oral care;Oral care BID   Frequent or constant Supervision/Assistance Dysphagia, unspecified (R13.10)     Continue with current plan of care    [place] Le Ferraz Clapp, MS, CCC-SLP Speech Language Pathologist Rehab Services; [facility] [phone] (ascom)   [place] J Clapp  02/15/2024, 10:57 AM

## 2024-02-16 DIAGNOSIS — Z66 Do not resuscitate: Secondary | ICD-10-CM | POA: Diagnosis present

## 2024-02-16 DIAGNOSIS — I160 Hypertensive urgency: Secondary | ICD-10-CM | POA: Diagnosis present

## 2024-02-16 DIAGNOSIS — E7800 Pure hypercholesterolemia, unspecified: Secondary | ICD-10-CM | POA: Diagnosis present

## 2024-02-16 DIAGNOSIS — F01C0 Vascular dementia, severe, without behavioral disturbance, psychotic disturbance, mood disturbance, and anxiety: Secondary | ICD-10-CM | POA: Diagnosis present

## 2024-02-16 DIAGNOSIS — E639 Nutritional deficiency, unspecified: Secondary | ICD-10-CM | POA: Diagnosis present

## 2024-02-16 DIAGNOSIS — F02C0 Dementia in other diseases classified elsewhere, severe, without behavioral disturbance, psychotic disturbance, mood disturbance, and anxiety: Secondary | ICD-10-CM | POA: Diagnosis present

## 2024-02-16 DIAGNOSIS — S72002A Fracture of unspecified part of neck of left femur, initial encounter for closed fracture: Secondary | ICD-10-CM | POA: Diagnosis present

## 2024-02-16 DIAGNOSIS — Z79899 Other long term (current) drug therapy: Secondary | ICD-10-CM | POA: Diagnosis not present

## 2024-02-16 DIAGNOSIS — Z823 Family history of stroke: Secondary | ICD-10-CM | POA: Diagnosis not present

## 2024-02-16 DIAGNOSIS — Z8673 Personal history of transient ischemic attack (TIA), and cerebral infarction without residual deficits: Secondary | ICD-10-CM | POA: Diagnosis not present

## 2024-02-16 DIAGNOSIS — G309 Alzheimer's disease, unspecified: Secondary | ICD-10-CM | POA: Diagnosis present

## 2024-02-16 DIAGNOSIS — S12110A Anterior displaced Type II dens fracture, initial encounter for closed fracture: Secondary | ICD-10-CM | POA: Diagnosis present

## 2024-02-16 DIAGNOSIS — Z91040 Latex allergy status: Secondary | ICD-10-CM | POA: Diagnosis not present

## 2024-02-16 DIAGNOSIS — K5900 Constipation, unspecified: Secondary | ICD-10-CM | POA: Diagnosis present

## 2024-02-16 DIAGNOSIS — Z8249 Family history of ischemic heart disease and other diseases of the circulatory system: Secondary | ICD-10-CM | POA: Diagnosis not present

## 2024-02-16 DIAGNOSIS — W010XXA Fall on same level from slipping, tripping and stumbling without subsequent striking against object, initial encounter: Secondary | ICD-10-CM | POA: Diagnosis present

## 2024-02-16 DIAGNOSIS — Z7901 Long term (current) use of anticoagulants: Secondary | ICD-10-CM | POA: Diagnosis not present

## 2024-02-16 DIAGNOSIS — M810 Age-related osteoporosis without current pathological fracture: Secondary | ICD-10-CM | POA: Diagnosis present

## 2024-02-16 DIAGNOSIS — D696 Thrombocytopenia, unspecified: Secondary | ICD-10-CM | POA: Diagnosis present

## 2024-02-16 DIAGNOSIS — Z853 Personal history of malignant neoplasm of breast: Secondary | ICD-10-CM | POA: Diagnosis not present

## 2024-02-16 DIAGNOSIS — E872 Acidosis, unspecified: Secondary | ICD-10-CM | POA: Diagnosis present

## 2024-02-16 DIAGNOSIS — Y92099 Unspecified place in other non-institutional residence as the place of occurrence of the external cause: Secondary | ICD-10-CM | POA: Diagnosis not present

## 2024-02-16 DIAGNOSIS — S72042A Displaced fracture of base of neck of left femur, initial encounter for closed fracture: Secondary | ICD-10-CM | POA: Diagnosis not present

## 2024-02-16 DIAGNOSIS — R1310 Dysphagia, unspecified: Secondary | ICD-10-CM | POA: Diagnosis present

## 2024-02-16 DIAGNOSIS — Z85828 Personal history of other malignant neoplasm of skin: Secondary | ICD-10-CM | POA: Diagnosis not present

## 2024-02-16 DIAGNOSIS — M25552 Pain in left hip: Secondary | ICD-10-CM | POA: Diagnosis present

## 2024-02-16 DIAGNOSIS — S12000A Unspecified displaced fracture of first cervical vertebra, initial encounter for closed fracture: Secondary | ICD-10-CM | POA: Diagnosis present

## 2024-02-16 LAB — BASIC METABOLIC PANEL WITH GFR
Anion gap: 5 (ref 5–15)
BUN: 19 mg/dL (ref 8–23)
CO2: 25 mmol/L (ref 22–32)
Calcium: 8.2 mg/dL — ABNORMAL LOW (ref 8.9–10.3)
Chloride: 108 mmol/L (ref 98–111)
Creatinine, Ser: 0.59 mg/dL (ref 0.44–1.00)
GFR, Estimated: >60 mL/min (ref >60)
Glucose, Bld: 105 mg/dL — ABNORMAL HIGH (ref 70–99)
Potassium: 3.6 mmol/L (ref 3.5–5.1)
Sodium: 138 mmol/L (ref 135–145)

## 2024-02-16 LAB — CBC
HCT: 32.5 % — ABNORMAL LOW (ref 36.0–46.0)
Hemoglobin: 10.7 g/dL — ABNORMAL LOW (ref 12.0–15.0)
MCH: 29.8 pg (ref 26.0–34.0)
MCHC: 32.9 g/dL (ref 30.0–36.0)
MCV: 90.5 fL (ref 80.0–100.0)
Platelets: 182 K/uL (ref 150–400)
RBC: 3.59 MIL/uL — ABNORMAL LOW (ref 3.87–5.11)
RDW: 13.2 % (ref 11.5–15.5)
WBC: 8.3 K/uL (ref 4.0–10.5)
nRBC: 0.2 % (ref 0.0–0.2)

## 2024-02-16 MED ORDER — HYDRALAZINE HCL 20 MG/ML IJ SOLN: 10.0000 mg | Freq: Four times a day (QID) | INTRAMUSCULAR | Status: DC | PRN

## 2024-02-16 MED ORDER — HYDRALAZINE HCL 50 MG PO TABS
50.0000 mg | ORAL_TABLET | Freq: Four times a day (QID) | ORAL | Status: DC | PRN
  Administered 2024-02-16: 50 mg via ORAL
  Filled 2024-02-16: qty 1

## 2024-02-16 MED ORDER — OXYCODONE HCL 5 MG PO TABS: 5.0000 mg | ORAL_TABLET | Freq: Four times a day (QID) | ORAL | 0 refills | Status: DC | PRN

## 2024-02-16 MED ORDER — AMLODIPINE BESYLATE 10 MG PO TABS
10.0000 mg | ORAL_TABLET | Freq: Every day | ORAL | Status: DC
  Administered 2024-02-16 – 2024-02-18 (×3): 10 mg via ORAL
  Filled 2024-02-16 (×3): qty 1

## 2024-02-16 NOTE — Progress Notes (Signed)
 Physical Therapy Treatment Patient Details Name: Anne Oneill MRN: [id number] DOB: 07/02/1926 Today's Date: 02/16/2024   History of Present Illness [name] is a 89 year old female with history of mixed dementia, hyperlipidemia, osteoporosis, osteopenia, who presents emergency department for chief concerns of unwitnessed fall from [place] memory care unit. Patient is s/p L hemiarthroplasty, posterior approach. WBAT.    PT Comments  Pt ready for session.  Awake this attempt.  She is able to get to EOB with mod a x 1 and pulling on pad.  Steady in sitting.  Stated she needed to use bathroom when asked.  Stood at EOB with mod a x 1 with significant post lean.  Does not correct with stepping in place and is unable to progress gait safely.  BSC is obtained and she transfers with mod a x 1 and needs mod a x 1 to bend hips to sit on BSC.  She is given time but does not void or have BM.  Given transfer and gait quality, +2 is called and after standing BSC is swapped for recliner.  Remained up in chair with needs met and set up for lunch.   If plan is discharge home, recommend the following: A lot of help with bathing/dressing/bathroom;Assistance with cooking/housework;Supervision due to cognitive status;Assist for transportation;Assistance with feeding;Two people to help with walking and/or transfers   Can travel by private vehicle        Equipment Recommendations       Recommendations for Other Services       Precautions / Restrictions Precautions Precautions: Fall;Posterior Hip Precaution Booklet Issued: No Recall of Precautions/Restrictions: Impaired Restrictions Weight Bearing Restrictions Per Provider Order: Yes LLE Weight Bearing Per Provider Order: Weight bearing as tolerated     Mobility  Bed Mobility Overal bed mobility: Needs Assistance Bed Mobility: Supine to Sit     Supine to sit: Mod assist       Patient Response: Cooperative  Transfers Overall  transfer level: Needs assistance Equipment used: Rolling walker (2 wheels) Transfers: Sit to/from Stand, Bed to chair/wheelchair/BSC Sit to Stand: Mod assist   Step pivot transfers: Mod assist       General transfer comment: post lean impairs ability to progress gait and transition to sitting    Ambulation/Gait Ambulation/Gait assistance: Mod assist Gait Distance (Feet): 3 Feet Assistive device: Rolling walker (2 wheels) Gait Pattern/deviations: Step-to pattern, Decreased step length - right, Decreased weight shift to left Gait velocity: decreased     General Gait Details: poor quaility steps to [facility]   Stairs             Wheelchair Mobility     Tilt Bed Tilt Bed Patient Response: Cooperative  Modified Rankin (Stroke Patients Only)       Balance Overall balance assessment: Needs assistance Sitting-balance support: Feet supported, Bilateral upper extremity supported Sitting balance-Leahy Scale: Fair   Postural control: Posterior lean Standing balance support: Bilateral upper extremity supported Standing balance-Leahy Scale: Poor Standing balance comment: post lean today impacting ability to progress gait                            Communication Communication Communication: No apparent difficulties  Cognition Arousal: Alert Behavior During Therapy: WFL for tasks assessed/performed   PT - Cognitive impairments: History of cognitive impairments                       PT - Cognition Comments:  Pt is very pleasant and agreeable to PT session Following commands: Impaired Following commands impaired: Follows one step commands with increased time    Cueing Cueing Techniques: Verbal cues, Visual cues  Exercises      General Comments        Pertinent Vitals/Pain Pain Assessment Pain Assessment: No/denies pain Pain Intervention(s): Monitored during session, Repositioned    Home Living                          Prior  Function            PT Goals (current goals can now be found in the care plan section) Progress towards PT goals: Progressing toward goals    Frequency    Min 2X/week      PT Plan      Co-evaluation              AM-PAC PT 6 Clicks Mobility   Outcome Measure  Help needed turning from your back to your side while in a flat bed without using bedrails?: A Lot Help needed moving from lying on your back to sitting on the side of a flat bed without using bedrails?: A Lot Help needed moving to and from a bed to a chair (including a wheelchair)?: A Lot Help needed standing up from a chair using your arms (e.g., wheelchair or bedside chair)?: A Lot Help needed to walk in hospital room?: A Lot Help needed climbing 3-5 steps with a railing? : Total 6 Click Score: 11    End of Session Equipment Utilized During Treatment: Cervical collar;Gait belt Activity Tolerance: Patient tolerated treatment well Patient left: in chair;with chair alarm set;with call bell/phone within reach Nurse Communication: Mobility status PT Visit Diagnosis: Other abnormalities of gait and mobility (R26.89);Muscle weakness (generalized) (M62.81) Pain - Right/Left: Left Pain - part of body: Leg     Time: 8848-8788 PT Time Calculation (min) (ACUTE ONLY): 20 min  Charges:    $Therapeutic Activity: 8-22 mins PT General Charges $$ ACUTE PT VISIT: 1 Visit                   Lauraine Gills, PTA 02/16/24, 1:08 PM

## 2024-02-16 NOTE — Progress Notes (Signed)
 Subjective: 4 Days Post-Op Procedure(s) (LRB): HEMIARTHROPLASTY (BIPOLAR) HIP, POSTERIOR APPROACH FOR FRACTURE (Left) Patient initially asleep upon provider entrance, rousable and able to answer questions. Reports no pain in the left leg this morning.   Son present during visit Patient is well out of C-collar this AM. Patient was in memory care unit prior to recent injury. Negative for chest pain and shortness of breath Fever: no Gastrointestinal:Negative for nausea and vomiting Continue to work on a BM.  Objective: Vital signs in last 24 hours: Temp:  [97.8 F (36.6 C)-98.5 F (36.9 C)] 97.9 F (36.6 C) (09/07 0820) Pulse Rate:  [62-76] 75 (09/07 0820) Resp:  [15-17] 15 (09/07 0820) BP: (159-191)/(61-75) 191/75 (09/07 0820) SpO2:  [94 %-97 %] 97 % (09/07 0820)  Intake/Output from previous day:  Intake/Output Summary (Last 24 hours) at 02/16/2024 0915 Last data filed at 02/16/2024 0550 Gross per 24 hour  Intake --  Output 1100 ml  Net -1100 ml    Intake/Output this shift: No intake/output data recorded.  Labs: Recent Labs    02/14/24 0620 02/15/24 0557 02/16/24 0617  HGB 10.5* 10.3* 10.7*   Recent Labs    02/15/24 0557 02/16/24 0617  WBC 8.6 8.3  RBC 3.41* 3.59*  HCT 31.1* 32.5*  PLT 167 182   Recent Labs    02/15/24 0557 02/16/24 0617  NA 140 138  K 4.0 3.6  CL 110 108  CO2 22 25  BUN 30* 19  CREATININE 0.60 0.59  GLUCOSE 119* 105*  CALCIUM  8.4* 8.2*   No results for input(s): LABPT, INR in the last 72 hours.   EXAM General - Patient is Alert and Lacking but much more responsive this morning. Extremity - ABD soft Sensation intact distally Dorsiflexion/Plantar flexion intact Incision: Mild spotting to the left hip honeycomb dressing this morning. Compartment soft Dressing/Incision - Honeycomb dressing intact with mild to moderate spotting. Honeycomb removed, area cleaned with topical alcohol and new honeycomb applied Motor Function - intact,  moving foot and toes well on exam. Abdomen soft  Past Medical History:  Diagnosis Date   Arthritis    Cancer (HCC)    Dementia (HCC)    mild   DVT (deep venous thrombosis) (HCC)    High cholesterol    Hyperlipemia    Osteoporosis    Squamous cell carcinoma of skin 02/19/2007   R cheek    Assessment/Plan: 4 Days Post-Op Procedure(s) (LRB): HEMIARTHROPLASTY (BIPOLAR) HIP, POSTERIOR APPROACH FOR FRACTURE (Left) Principal Problem:   Closed left femoral fracture (HCC) Active Problems:   History of breast cancer   History of TIA (transient ischemic attack)   Hyperlipidemia   Mixed Alzheimer's and vascular dementia (HCC)   Osteoporosis   Hypertensive urgency   Thrombocytopenia (HCC)   Multiple closed fractures of cervical vertebrae (HCC)   Closed fracture of odontoid process of axis (HCC)  Estimated body mass index is 18.51 kg/m as calculated from the following:   Height as of this encounter: 5' (1.524 m).   Weight as of this encounter: 43 kg. Up with therapy D/C IV fluids when tolerating PO intake.  Labs and vitals reviewed.  WBC down to 8.3. Hg 10.7, improving and stable. Continue to monitor. Up with therapy as much as can tolerated today. Continue to work on a BM - senna, lactulose , miralax  and suppository ordered History of dementia, denies pain this AM.  Able to rouse and answer questions this AM  New honeycomb dressing applied  DVT Prophylaxis - Lovenox  and TED  hose Weight-Bearing as tolerated to left leg  Fonda CHARLENA Koyanagi, PA-C [facility] 02/16/2024, 9:15 AM

## 2024-02-16 NOTE — Plan of Care (Signed)

## 2024-02-16 NOTE — Progress Notes (Signed)
 Speech Language Pathology Treatment: Dysphagia  Patient Details Name: Anne Oneill MRN: [id number] DOB: 09-25-1926 Today's Date: 02/16/2024 Time: 1225-1310 SLP Time Calculation (min) (ACUTE ONLY): 45 min  Assessment / Plan / Recommendation Clinical Impression  Anne Oneill seen for ongoing assessment of swallowing; trials to upgrade diet if able for more choices. Anne Oneill was alert, verbally responsive and engaged in conversation w/ SLP; Anne Oneill present.  Anne Oneill is on RA; wbc wnl. Anne Oneill has been tolerating the Minced foods diet well per Anne Oneill and NSG. Noted in Neurosurgery's note, Anne Oneill is Not wearing the neck collar/brace while in bed(which allows her increased mandible ROM)- Anne Oneill is eating meals in bed at times. Anne Oneill is tolerating swallowing Pills in Puree well w/ NSG.  Anne Oneill and Anne Oneill explained general aspiration precautions and agreed fully to the need for following them especially sitting upright for all oral intake and using Small bites/sips. Noted Anne Oneill fed self using such during the meal. No overt clinical s/s of aspiration were noted w/ any consistency; respiratory status remained calm and unlabored, vocal quality clear b/t trials w/ no coughing. Oral phase appeared grossly [facility] for bolus management and timely A-P transfer for swallowing; oral clearing achieved w/ all consistencies. Mastication effort/time min increased w/ the meats, as expected while wearing the neck collar/brace(restricted mandible ROM).   Discussed w/ Anne Oneill/Anne Oneill the diet consistency of Mech Soft which would allow more food choices but could includ use of MINCED meats moistened w/ Gravies/condiments for ease of mastication and overall intake. Anne Oneill agreed. Highlighted w/ Anne Oneill, Anne Oneill ways to reduce risks for aspiration by following general aspiration precautions including Small sips Slowly, No straws if coughing noted when using, and Sit fully Upright w/ all oral intake. Recommended reducing distractions including Talking at meals. Also discussed food consistencies and options,  preparation, and use of condiments to soften foods also. Encouragement given for ALL Pills to be swallowed using a Puree for safer swallowing now and at Discharge to rehab. Anne Oneill, Anne Oneill agreed.      Recommend a Dysphagia level 3(mech soft) diet consistency w/ Minced Meats diet; Thin liquids. Aspiration precautions. Pills in a Puree for safer swallowing. ST services will sign off at this time; f/u at next venue of care can be had as needs indicate. Anne Oneill would be expected to return to her usual diet consistency as PTA when no longer needing to wear the neck collar/brace, as her oropharyngeal phase swallowing appears WFL as at her Baseline.  Education completed w/ Anne Oneill, Anne Oneill. NSG/MD to reconsult if new needs arise while admitted. Anne Oneill, Anne Oneill agreed.       Anne Oneill appears at reduced risk for aspriation when following general aspiration precautions. Recommend continue a Regular/mech soft diet for ease of soft foods w/ gravies added to moisten foods; Thin liquids. Recommend general aspiration precautions; Pills Whole in Puree; tray setup and positioning assistance for meals. REFLUX precautions d/t Anne Oneill's baseline. ST services will sign off at this time w/ MD to reconsult if needed while admitted. NSG updated. Precautions posted at bedside.   HPI HPI: Per H&P, Anne Oneill is a 89 year old female with history of mixed Dementia, hyperlipidemia, osteoporosis, osteopenia, who presents emergency department for chief concerns of unwitnessed fall from [place] memory care unit..   CXR at admit: No active disease.   Per Neurosurgery note: Given her advanced dementia, I would not recommend surgical intervention.  I recommended that she continue her collar when out of bed.  She should remove her collar to eat and when asleep..  Patient  is s/p L hemiarthroplasty, posterior approach.      SLP Plan  All goals met          Recommendations  Diet recommendations: Dysphagia 3 (mechanical soft);Thin liquid (meats Minced while  mandible ROM is restricted d/t Neck Collar/brace in place when out of bed) Liquids provided via: Cup;Straw (monitor) Medication Administration: Whole meds with puree Supervision: Patient able to self feed;Intermittent supervision to cue for compensatory strategies (assist as needed, esp w/ setup) Compensations: Minimize environmental distractions;Slow rate;Small sips/bites;Lingual sweep for clearance of pocketing;Follow solids with liquid Postural Changes and/or Swallow Maneuvers: Out of bed for meals;Seated upright 90 degrees;Upright 30-60 min after meal                 (Dietician f/u) Oral care BID;Oral care before and after PO;Staff/trained caregiver to provide oral care (support)   Intermittent Supervision/Assistance (at meals, esp for setup) Dysphagia, unspecified (R13.10) (restricted mandible ROM d/t neck collar/brace; Dementia)     All goals met       Comer Portugal, MS, CCC-SLP Speech Language Pathologist Rehab Services; [facility] [phone] (ascom) Reina Wilton  02/16/2024, 2:30 PM

## 2024-02-16 NOTE — Plan of Care (Signed)

## 2024-02-16 NOTE — TOC Progression Note (Signed)
 Transition of Care [facility]) - Progression Note    Patient Details  Name: Anne Oneill MRN: [id number] Date of Birth: January 31, 1927  [facility] CM/SW Contact  Oday Ridings E Undray Allman, LCSW Phone Number: 02/16/2024, 8:25 AM  Clinical Narrative:    Awaiting bed offers. Daughter stated [facility] is 1st preference. Asked Alfonso in Admissipns to review.   Expected Discharge Plan: Skilled Nursing Facility Barriers to Discharge: Continued Medical Work up               Expected Discharge Plan and Services       Living arrangements for the past 2 months: Assisted Living Facility                                       Social Drivers of Health (SDOH) Interventions SDOH Screenings   Food Insecurity: No Food Insecurity (02/13/2024)  Housing: Low Risk  (02/13/2024)  Transportation Needs: No Transportation Needs (02/13/2024)  Utilities: Not At Risk (02/13/2024)  Social Connections: Unknown (02/13/2024)  Tobacco Use: Low Risk  (02/12/2024)    Readmission Risk Interventions     No data to display

## 2024-02-16 NOTE — Progress Notes (Signed)
 Progress Note   Patient: Anne Oneill FMW:[id number] DOB: Nov 10, 1926 DOA: 02/11/2024     5 DOS: the patient was seen and examined on 02/16/2024   Brief hospital course: [name] is a 89 year old female with history of mixed dementia, hyperlipidemia, osteoporosis, osteopenia, who presents emergency department for chief concerns of unwitnessed fall from [place] memory care unit.  Vitals in the ED showed temperature of 99.6, respiration rate 17, heart rate 79, blood pressure 208/98, SpO2 of 94% on 4 L nasal cannula.  Serum sodium is 137, potassium 4.4, chloride 103, bicarb 23, BUN of 29, serum creatinine of 0.77, EGFR greater than 60, nonfasting blood glucose 126, WBC 7.9, hemoglobin 13.9, platelet 211.  ED treatment: Morphine  4 mg IV one-time dose, ondansetron  4 mg IV one-time dose, sodium chloride  500 mL liter bolus.  EDP discussed case with Dr. Katrina, who states he will see the patient and family to discuss his recommendations.  EDP also consulted orthopedic surgeon, Dr. Edie will see the patient.  Assessment and Plan:  # Left femoral fracture, closed due to fall Continue as needed meds for pain control Orthopedic surgery consulted, s/p Left hip hemiarthroplasty  done on 9/3.      # Multiple closed fractures of cervical vertebrae  Seen by neurosurgery, recommended no surgical intervention, continue cervical collar remains out of bed. She should remove her collar to eat and when asleep.  She is cleared for hip surgery, recommended she stay in her collar for intubation and throughout the case.  She can then have her collar removed in the recovery area.    Mixed Alzheimer's and vascular dementia Home memantine  10 mg p.o. twice daily resumed on admission   Hypertensive urgency Home spironolactone  25 mg daily, metoprolol  succinate 50 mg daily were resumed on admission Secondary to pain with multiple cervical fractures and femur fracture Hydralazine  10 mg IV every 6 hr  prn SBP >170 9/7 started amlodipine  10 mg p.o. daily Monitor BP and titrate medications accordingly.   Hyperlipidemia Simvastatin  20 mg nightly resumed   Hypophosphatemia secondary to nutritional deficiency Phos repleted. Monitor electrolytes daily and replete as needed.   Metabolic acidosis, bicarbonate 50 mEq IV x 1 dose given on 9/4 Check BMP daily   Dysphagia SLP eval done, diet advanced to dysphagia 3 diet     Body mass index is 18.51 kg/m.  Interventions:   Diet: Dysphagia 3 diet DVT Prophylaxis: Subcutaneous Lovenox     Advance goals of care discussion: DNR-Limited   Family Communication: Family was present at bedside, at the time of interview.  The pt provided permission to discuss medical plan with the family. Opportunity was given to ask question and all questions were answered satisfactorily.    Disposition:  Pt is from [place] [facility] under [facility] care, admitted with C-spine abd Left femoral fx, s/p left hip hemiarthroplasty on 9/3, which precludes a safe discharge. Discharge to SNF, when bed will be available.  Follow TOC.      Subjective: No acute complaints  She states she has not had  BM since surgery. Denies abd pain, passing gas.  Physical Exam: Vitals:   02/16/24 0524 02/16/24 0820 02/16/24 1042 02/16/24 1544  BP: (!) 159/61 (!) 191/75 (!) 169/77 (!) 116/54  Pulse: 76 75 72 79  Resp: 17 15  15   Temp: 97.8 F (36.6 C) 97.9 F (36.6 C)  98.3 F (36.8 C)  TempSrc: Oral Oral    SpO2: 96% 97%  100%  Weight:  Height:      General: NAD, lying comfortably Appear in no distress, affect appropriate Eyes: PERRLA ENT: Neck collar intact Neck: no JVD,  Cardiovascular: S1 and S2 Present, no Murmur,  Respiratory: good respiratory effort, Bilateral Air entry equal and Decreased, no Crackles, no wheezes Abdomen: Bowel Sound present, Soft and no tenderness,  Skin: no rashes Extremities: s/p Left hemiarthroplasty, dressing CDI.   no  significant Pedal edema Neurologic: without any new focal findings Gait not checked due to patient safety concerns  Data Reviewed: I have personally reviewed and interpreted daily labs, tele strips, imagings as discussed above. I reviewed all nursing notes, pharmacy notes, vitals, pertinent old records I have discussed plan of care as described above with RN and patient/family.      Disposition: Status is: Inpatient Remains inpatient appropriate   Planned Discharge Destination: Skilled nursing facility    Time spent: 35 minutes  Author: Elvan Sor, MD 02/16/2024 4:34 PM  For on call review [email or web address].

## 2024-02-16 NOTE — Progress Notes (Signed)
 Civil engineer, contracting Liaison Note  [name] has revoked her hospice Medicare Benefit to seek short term rehab following hip fracture with surgical repair.  Revocation paperwork signed by patient's daughter, [name].  Please call with any hospice related questions or concerns.  Thank you,  [name], RN Nurse Liaison [phone]

## 2024-02-17 ENCOUNTER — Encounter: Payer: Self-pay | Admitting: Surgery

## 2024-02-17 DIAGNOSIS — S72042A Displaced fracture of base of neck of left femur, initial encounter for closed fracture: Secondary | ICD-10-CM | POA: Diagnosis not present

## 2024-02-17 LAB — CBC
HCT: 33.1 % — ABNORMAL LOW (ref 36.0–46.0)
Hemoglobin: 11.3 g/dL — ABNORMAL LOW (ref 12.0–15.0)
MCH: 30.7 pg (ref 26.0–34.0)
MCHC: 34.1 g/dL (ref 30.0–36.0)
MCV: 89.9 fL (ref 80.0–100.0)
Platelets: 205 K/uL (ref 150–400)
RBC: 3.68 MIL/uL — ABNORMAL LOW (ref 3.87–5.11)
RDW: 13.2 % (ref 11.5–15.5)
WBC: 9.1 K/uL (ref 4.0–10.5)
nRBC: 0.2 % (ref 0.0–0.2)

## 2024-02-17 LAB — BASIC METABOLIC PANEL WITH GFR
Anion gap: 6 (ref 5–15)
BUN: 22 mg/dL (ref 8–23)
CO2: 23 mmol/L (ref 22–32)
Calcium: 8.1 mg/dL — ABNORMAL LOW (ref 8.9–10.3)
Chloride: 106 mmol/L (ref 98–111)
Creatinine, Ser: 0.59 mg/dL (ref 0.44–1.00)
GFR, Estimated: >60 mL/min (ref >60)
Glucose, Bld: 108 mg/dL — ABNORMAL HIGH (ref 70–99)
Potassium: 3.8 mmol/L (ref 3.5–5.1)
Sodium: 135 mmol/L (ref 135–145)

## 2024-02-17 LAB — MAGNESIUM: Magnesium: 2.2 mg/dL (ref 1.7–2.4)

## 2024-02-17 LAB — PHOSPHORUS: Phosphorus: 2.1 mg/dL — ABNORMAL LOW (ref 2.5–4.6)

## 2024-02-17 MED ORDER — K PHOS MONO-SOD PHOS DI & MONO 155-852-130 MG PO TABS
500.0000 mg | ORAL_TABLET | Freq: Four times a day (QID) | ORAL | Status: AC
Start: 2024-02-17 — End: 2024-02-17
  Administered 2024-02-17 (×2): 500 mg via ORAL
  Filled 2024-02-17 (×2): qty 2

## 2024-02-17 MED ORDER — ENOXAPARIN SODIUM 30 MG/0.3ML IJ SOSY: 30.0000 mg | PREFILLED_SYRINGE | Freq: Every evening | INTRAMUSCULAR | 0 refills | Status: DC

## 2024-02-17 NOTE — Progress Notes (Signed)
 Physical Therapy Treatment Patient Details Name: Anne Oneill MRN: [id number] DOB: Jan 20, 1927 Today's Date: 02/17/2024   History of Present Illness [name] is a 89 year old female with history of mixed dementia, hyperlipidemia, osteoporosis, osteopenia, who presents emergency department for chief concerns of unwitnessed fall from [place] memory care unit. Patient is s/p L hemiarthroplasty, posterior approach. WBAT.    PT Comments  Pt ready for session.  She stated she needs to use bathroom but upon transitioning to EOB with min a x 1 it is noted she has had a large soft BM.  Min a +2 to transfer to [facility] for care.  +2 for standing care then is able to progress gait 5' with min a x 2.  After short seated rest, she is able to progress gait to 10' with RW and min a x 1 with recliner follow due to fatigue.  Remained in recliner after session with needs met.  Updated son on progress.  Co-tx with OT for improved outcomes.   If plan is discharge home, recommend the following: A lot of help with bathing/dressing/bathroom;Assistance with cooking/housework;Supervision due to cognitive status;Assist for transportation;Assistance with feeding;Two people to help with walking and/or transfers   Can travel by private vehicle        Equipment Recommendations  None recommended by PT    Recommendations for Other Services       Precautions / Restrictions Precautions Precautions: Fall;Posterior Hip Precaution Booklet Issued: No Recall of Precautions/Restrictions: Impaired Restrictions Weight Bearing Restrictions Per Provider Order: Yes LLE Weight Bearing Per Provider Order: Weight bearing as tolerated     Mobility  Bed Mobility Overal bed mobility: Needs Assistance Bed Mobility: Supine to Sit     Supine to sit: Min assist       Patient Response: Cooperative  Transfers Overall transfer level: Needs assistance Equipment used: Rolling walker (2 wheels) Transfers: Sit to/from  Stand, Bed to chair/wheelchair/BSC Sit to Stand: Min assist                Ambulation/Gait Ambulation/Gait assistance: Min assist, +2 physical assistance Gait Distance (Feet): 10 Feet Assistive device: Rolling walker (2 wheels) Gait Pattern/deviations: Step-to pattern, Decreased step length - right, Decreased weight shift to left Gait velocity: decreased     General Gait Details: improved steps today continues to need +1 hands on assist for mobility and chair follow   Stairs             Wheelchair Mobility     Tilt Bed Tilt Bed Patient Response: Cooperative  Modified Rankin (Stroke Patients Only)       Balance Overall balance assessment: Needs assistance Sitting-balance support: Feet supported, Bilateral upper extremity supported Sitting balance-Leahy Scale: Fair     Standing balance support: Bilateral upper extremity supported Standing balance-Leahy Scale: Poor Standing balance comment: improved standing balance and ability to progress gait.                            Communication Communication Communication: No apparent difficulties  Cognition Arousal: Alert Behavior During Therapy: WFL for tasks assessed/performed   PT - Cognitive impairments: History of cognitive impairments                       PT - Cognition Comments: Pt is very pleasant and agreeable to PT session Following commands: Impaired Following commands impaired: Follows one step commands with increased time    Cueing Cueing Techniques: Verbal  cues, Visual cues  Exercises Other Exercises Other Exercises: to [facility] for care due to large soft BM in bed.    General Comments        Pertinent Vitals/Pain Pain Assessment Pain Assessment: Faces Faces Pain Scale: Hurts a little bit Pain Location: seems generally comfortable with mobility. Pain Intervention(s): Limited activity within patient's tolerance, Monitored during session, Repositioned    Home Living                           Prior Function            PT Goals (current goals can now be found in the care plan section) Progress towards PT goals: Progressing toward goals    Frequency    Min 2X/week      PT Plan      Co-evaluation PT/OT/SLP Co-Evaluation/Treatment: Yes Reason for Co-Treatment: Necessary to address cognition/behavior during functional activity;For patient/therapist safety;To address functional/ADL transfers PT goals addressed during session: Mobility/safety with mobility;Balance OT goals addressed during session: ADL's and self-care      AM-PAC PT 6 Clicks Mobility   Outcome Measure  Help needed turning from your back to your side while in a flat bed without using bedrails?: A Lot Help needed moving from lying on your back to sitting on the side of a flat bed without using bedrails?: A Little Help needed moving to and from a bed to a chair (including a wheelchair)?: A Lot Help needed standing up from a chair using your arms (e.g., wheelchair or bedside chair)?: A Lot Help needed to walk in hospital room?: A Lot Help needed climbing 3-5 steps with a railing? : Total 6 Click Score: 12    End of Session Equipment Utilized During Treatment: Cervical collar;Gait belt Activity Tolerance: Patient tolerated treatment well Patient left: in chair;with chair alarm set;with call bell/phone within reach Nurse Communication: Mobility status PT Visit Diagnosis: Other abnormalities of gait and mobility (R26.89);Muscle weakness (generalized) (M62.81) Pain - Right/Left: Left Pain - part of body: Leg     Time: 9052-8986 PT Time Calculation (min) (ACUTE ONLY): 26 min  Charges:    $Gait Training: 8-22 mins PT General Charges $$ ACUTE PT VISIT: 1 Visit                   Lauraine Gills, PTA 02/17/24, 10:39 AM

## 2024-02-17 NOTE — Plan of Care (Signed)
  Problem: Clinical Measurements: Goal: Will remain free from infection Outcome: Progressing Goal: Diagnostic test results will improve Outcome: Progressing Goal: Cardiovascular complication will be avoided Outcome: Progressing   Problem: Activity: Goal: Risk for activity intolerance will decrease Outcome: Progressing   

## 2024-02-17 NOTE — Progress Notes (Signed)
 Occupational Therapy Treatment Patient Details Name: Anne Oneill MRN: [id number] DOB: May 14, 1927 Today's Date: 02/17/2024   History of present illness [name] is a 89 year old female with history of mixed dementia, hyperlipidemia, osteoporosis, osteopenia, who presents emergency department for chief concerns of unwitnessed fall from [place] memory care unit. Patient is s/p L hemiarthroplasty, posterior approach. WBAT.   OT comments  Pt is supine in bed on arrival. Pleasant and agreeable to PT/OT co-tx session to maximize pt/therapist safety and progress mobility. She denies pain. Pt performed bed mobility with Min A and verb cues for initiation and technique. Pt required Min A x1 to stand from EOB to RW with cues for hand placement and Min A x2 to safely SPT to [facility]. Pt with large incont BM requiring full clean up, linen change and purewick change. Total/Max A for all hygiene/cleanup, able to stand with Min A x1 during thorough peri-care. Pt progressed ambulation this date using RW with Min A x2 and close chair follow for x 2 bouts one for 5 ft and another 10 ft after seated rest break.  Pt returned to recliner with all needs in place and will cont to require skilled acute OT services to maximize her safety and IND to return to PLOF.       If plan is discharge home, recommend the following:  Two people to help with walking and/or transfers;A lot of help with bathing/dressing/bathroom   Equipment Recommendations  Other (comment) (defer)    Recommendations for Other Services      Precautions / Restrictions Precautions Precautions: Fall;Posterior Hip Precaution Booklet Issued: No Recall of Precautions/Restrictions: Impaired Restrictions Weight Bearing Restrictions Per Provider Order: Yes LLE Weight Bearing Per Provider Order: Weight bearing as tolerated       Mobility Bed Mobility Overal bed mobility: Needs Assistance Bed Mobility: Supine to Sit     Supine to sit:  Min assist     General bed mobility comments: cues for initiation, sequencing, assist for BLE management to reach EOB    Transfers Overall transfer level: Needs assistance Equipment used: Rolling walker (2 wheels) Transfers: Sit to/from Stand, Bed to chair/wheelchair/BSC Sit to Stand: Min assist     Step pivot transfers: Min assist, +2 physical assistance, +2 safety/equipment     General transfer comment: pt able to stand from EOB to RW with Min A x1 with cues for hand placement, requiring MIn A x2 to safely perform SPT to [facility] and lateral steps and ambulation within the room for chair follow     Balance Overall balance assessment: Needs assistance Sitting-balance support: Feet supported, Bilateral upper extremity supported Sitting balance-Leahy Scale: Fair     Standing balance support: Bilateral upper extremity supported, Reliant on assistive device for balance Standing balance-Leahy Scale: Poor Standing balance comment: RW, UE support and Min A with chair follow                           ADL either performed or assessed with clinical judgement   ADL Overall ADL's : Needs assistance/impaired                 Upper Body Dressing : Total assistance Upper Body Dressing Details (indicate cue type and reason): to don c collar     Toilet Transfer: Minimal assistance;Rolling walker (2 wheels);BSC/3in1;Stand-pivot;+2 for physical assistance   Toileting- Clothing Manipulation and Hygiene: Maximal assistance;Total assistance;Sitting/lateral lean;Sit to/from stand Toileting - Clothing Manipulation Details (indicate cue type and  reason): incont BM in bed and continued use of BSC requiring extensive clean up            Extremity/Trunk Assessment              Vision       Perception     Praxis     Communication Communication Communication: No apparent difficulties   Cognition Arousal: Alert Behavior During Therapy: WFL for tasks assessed/performed                                  Following commands: Impaired Following commands impaired: Follows one step commands with increased time      Cueing   Cueing Techniques: Verbal cues, Visual cues  Exercises      Shoulder Instructions       General Comments pleasantly unaware of deficits    Pertinent Vitals/ Pain       Pain Assessment Pain Assessment: No/denies pain Faces Pain Scale: Hurts a little bit Pain Location: denies pain Pain Intervention(s): Monitored during session, Repositioned  Home Living                                          Prior Functioning/Environment              Frequency  Min 1X/week        Progress Toward Goals  OT Goals(current goals can now be found in the care plan section)  Progress towards OT goals: Progressing toward goals  Acute Rehab OT Goals Patient Stated Goal: go to rehab OT Goal Formulation: With family Time For Goal Achievement: 02/27/24 Potential to Achieve Goals: Fair  Plan      Co-evaluation    PT/OT/SLP Co-Evaluation/Treatment: Yes Reason for Co-Treatment: Necessary to address cognition/behavior during functional activity;For patient/therapist safety;To address functional/ADL transfers PT goals addressed during session: Mobility/safety with mobility;Balance OT goals addressed during session: ADL's and self-care      AM-PAC OT 6 Clicks Daily Activity     Outcome Measure   Help from another person eating meals?: A Lot Help from another person taking care of personal grooming?: A Lot Help from another person toileting, which includes using toliet, bedpan, or urinal?: A Lot Help from another person bathing (including washing, rinsing, drying)?: A Lot Help from another person to put on and taking off regular upper body clothing?: A Lot Help from another person to put on and taking off regular lower body clothing?: A Lot 6 Click Score: 12    End of Session Equipment Utilized  During Treatment: Gait belt;Rolling walker (2 wheels)  OT Visit Diagnosis: Other abnormalities of gait and mobility (R26.89);Muscle weakness (generalized) (M62.81)   Activity Tolerance Patient tolerated treatment well   Patient Left with call bell/phone within reach;in chair;with chair alarm set   Nurse Communication Mobility status        Time: 9053-8985 OT Time Calculation (min): 28 min  Charges: OT General Charges $OT Visit: 1 Visit OT Treatments $Self Care/Home Management : 8-22 mins  Anne Oneill, OTR/L  02/17/24, 10:47 AM   Duwaine FORBES Saupe 02/17/2024, 10:44 AM

## 2024-02-17 NOTE — Progress Notes (Signed)
 Progress Note   Patient: Anne Oneill FMW:[id number] DOB: Dec 18, 1926 DOA: 02/11/2024     6 DOS: the patient was seen and examined on 02/17/2024   Brief hospital course: [name] is a 89 year old female with history of mixed dementia, hyperlipidemia, osteoporosis, osteopenia, who presents emergency department for chief concerns of unwitnessed fall from [place] memory care unit.  Vitals in the ED showed temperature of 99.6, respiration rate 17, heart rate 79, blood pressure 208/98, SpO2 of 94% on 4 L nasal cannula.  Serum sodium is 137, potassium 4.4, chloride 103, bicarb 23, BUN of 29, serum creatinine of 0.77, EGFR greater than 60, nonfasting blood glucose 126, WBC 7.9, hemoglobin 13.9, platelet 211.  ED treatment: Morphine  4 mg IV one-time dose, ondansetron  4 mg IV one-time dose, sodium chloride  500 mL liter bolus.  EDP discussed case with Dr. Katrina, who states he will see the patient and family to discuss his recommendations.  EDP also consulted orthopedic surgeon, Dr. Edie will see the patient.  Assessment and Plan:  # Left femoral fracture, closed due to fall Continue as needed meds for pain control Orthopedic surgery consulted, s/p Left hip hemiarthroplasty  done on 9/3.  Continue Lovenox  daily x 2 weeks for DVT prophylaxis as per orthopedic surgery    # Multiple closed fractures of cervical vertebrae  Seen by neurosurgery, recommended no surgical intervention, continue cervical collar remains out of bed. She should remove her collar to eat and when asleep.  She is cleared for hip surgery, recommended she stay in her collar for intubation and throughout the case.  She can then have her collar removed in the recovery area.    Mixed Alzheimer's and vascular dementia Home memantine  10 mg p.o. twice daily resumed on admission   Hypertensive urgency Home spironolactone  25 mg daily, metoprolol  succinate 50 mg daily were resumed on admission Secondary to pain with  multiple cervical fractures and femur fracture Hydralazine  10 mg IV every 6 hr prn SBP >170 9/7 started amlodipine  10 mg p.o. daily Monitor BP and titrate medications accordingly.   Hyperlipidemia Simvastatin  20 mg nightly resumed   Hypophosphatemia secondary to nutritional deficiency Phos repleted. Monitor electrolytes daily and replete as needed.   Metabolic acidosis, bicarbonate 50 mEq IV x 1 dose given on 9/4 Check BMP daily   Dysphagia SLP eval done, diet advanced to dysphagia 3 diet     Body mass index is 18.51 kg/m.  Interventions:   Diet: Dysphagia 3 diet DVT Prophylaxis: Subcutaneous Lovenox     Advance goals of care discussion: DNR-Limited   Family Communication: Family was present at bedside, at the time of interview.  The pt provided permission to discuss medical plan with the family. Opportunity was given to ask question and all questions were answered satisfactorily.    Disposition:  Pt is from [place] [facility] care under [facility] care, admitted with C-spine abd Left femoral fx, s/p left hip hemiarthroplasty on 9/3, which precludes a safe discharge. Discharge to SNF, when bed will be available.  Follow TOC. 9/8 DC tomorrow a.m. to SNF, patient will go to [facility], they do not have beds today.      Subjective: Patient was seen and examined at bedside today. Patient was sitting on the recliner, eating food. In the morning when she woke up she had pain in the right side of her neck which resolved.  Denied any complaints during my encounter Family was at bedside, management plan discussed.   Physical Exam: Vitals:  02/16/24 2151 02/17/24 0519 02/17/24 0858 02/17/24 1625  BP: (!) 151/68 (!) 164/75 (!) 121/49 (!) 125/50  Pulse: 70 78 73 80  Resp: 16 17 18 18   Temp: 98.3 F (36.8 C) 98.3 F (36.8 C) 97.6 F (36.4 C) 98.5 F (36.9 C)  TempSrc: Oral Oral Oral Oral  SpO2: 97% 96% 97% 98%  Weight:      Height:      General: NAD, lying  comfortably Appear in no distress, affect appropriate Eyes: PERRLA ENT: Neck collar intact Neck: no JVD,  Cardiovascular: S1 and S2 Present, no Murmur,  Respiratory: good respiratory effort, Bilateral Air entry equal and Decreased, no Crackles, no wheezes Abdomen: Bowel Sound present, Soft and no tenderness,  Skin: no rashes Extremities: s/p Left hemiarthroplasty, dressing CDI.   no significant Pedal edema Neurologic: without any new focal findings Gait not checked due to patient safety concerns  Data Reviewed: I have personally reviewed and interpreted daily labs, tele strips, imagings as discussed above. I reviewed all nursing notes, pharmacy notes, vitals, pertinent old records I have discussed plan of care as described above with RN and patient/family.      Disposition: Status is: Inpatient Remains inpatient appropriate   Planned Discharge Destination: Skilled nursing facility    Time spent: 35 minutes  Author: Elvan Sor, MD 02/17/2024 5:12 PM  For on call review [email or web address].

## 2024-02-17 NOTE — Progress Notes (Signed)
 [facility] Hospice Liaison Note  Palliative care referral submitted today for follow up at SNF rehab when bed has been found.    Please call with any palliative care related questions or concerns  [facility] Liaison [phone]

## 2024-02-17 NOTE — Plan of Care (Signed)

## 2024-02-17 NOTE — Discharge Instructions (Signed)
 Diet: As you were doing prior to hospitalization   Shower:  May shower but keep the wounds dry, use an occlusive plastic wrap, NO SOAKING IN TUB.  If the bandage gets wet, change with a clean dry gauze.  Dressing:  You may change your dressing as needed. Change the dressing with sterile gauze dressing.    Activity:  Increase activity slowly as tolerated, but follow the weight bearing instructions below.  No lifting or driving for 6 weeks.  Weight Bearing:   Weight bearing as tolerated to left lower extremity  To prevent constipation: you may use a stool softener such as -  Colace (over the counter) 100 mg by mouth twice a day  Drink plenty of fluids (prune juice may be helpful) and high fiber foods Miralax  (over the counter) for constipation as needed.    Itching:  If you experience itching with your medications, try taking only a single pain pill, or even half a pain pill at a time.  You may take up to 10 pain pills per day, and you can also use benadryl over the counter for itching or also to help with sleep.   Precautions:  If you experience chest pain or shortness of breath - call 911 immediately for transfer to the hospital emergency department!!  If you develop a fever greater that 101 F, purulent drainage from wound, increased redness or drainage from wound, or calf pain-Call [facility]                                              Follow- Up Appointment:  Please call for an appointment to be seen in 6 weeks at [facility].  Staples can be removed by SNF on 02/26/24

## 2024-02-17 NOTE — Progress Notes (Signed)
 Subjective: 5 Days Post-Op Procedure(s) (LRB): HEMIARTHROPLASTY (BIPOLAR) HIP, POSTERIOR APPROACH FOR FRACTURE (Left) Patient reports that her left hip is feeling better this morning.  Denies any pain this morning. Patient is well out of C-collar this AM. Patient was in memory care unit prior to recent injury. Negative for chest pain and shortness of breath Fever: no Gastrointestinal:Negative for nausea and vomiting Patient has had a BM.  Objective: Vital signs in last 24 hours: Temp:  [97.9 F (36.6 C)-98.3 F (36.8 C)] 98.3 F (36.8 C) (09/08 0519) Pulse Rate:  [70-79] 78 (09/08 0519) Resp:  [15-17] 17 (09/08 0519) BP: (116-191)/(54-77) 164/75 (09/08 0519) SpO2:  [96 %-100 %] 96 % (09/08 0519)  Intake/Output from previous day:  Intake/Output Summary (Last 24 hours) at 02/17/2024 0750 Last data filed at 02/17/2024 0519 Gross per 24 hour  Intake 1205.64 ml  Output 1100 ml  Net 105.64 ml    Intake/Output this shift: No intake/output data recorded.  Labs: Recent Labs    02/15/24 0557 02/16/24 0617 02/17/24 0348  HGB 10.3* 10.7* 11.3*   Recent Labs    02/16/24 0617 02/17/24 0348  WBC 8.3 9.1  RBC 3.59* 3.68*  HCT 32.5* 33.1*  PLT 182 205   Recent Labs    02/16/24 0617 02/17/24 0348  NA 138 135  K 3.6 3.8  CL 108 106  CO2 25 23  BUN 19 22  CREATININE 0.59 0.59  GLUCOSE 105* 108*  CALCIUM  8.2* 8.1*   No results for input(s): LABPT, INR in the last 72 hours.   EXAM General - Patient is Alert and Lacking but responsive this morning. Extremity - ABD soft Sensation intact distally Dorsiflexion/Plantar flexion intact Incision: Mild spotting to the left hip honeycomb dressing this morning. Compartment soft Dressing/Incision - Honeycomb dressing intact with mild spotting this AM. Motor Function - intact, moving foot and toes well on exam. Abdomen soft  Past Medical History:  Diagnosis Date   Arthritis    Cancer (HCC)    Dementia (HCC)    mild    DVT (deep venous thrombosis) (HCC)    High cholesterol    Hyperlipemia    Osteoporosis    Squamous cell carcinoma of skin 02/19/2007   R cheek    Assessment/Plan: 5 Days Post-Op Procedure(s) (LRB): HEMIARTHROPLASTY (BIPOLAR) HIP, POSTERIOR APPROACH FOR FRACTURE (Left) Principal Problem:   Closed left femoral fracture (HCC) Active Problems:   History of breast cancer   History of TIA (transient ischemic attack)   Hyperlipidemia   Mixed Alzheimer's and vascular dementia (HCC)   Osteoporosis   Hypertensive urgency   Thrombocytopenia (HCC)   Multiple closed fractures of cervical vertebrae (HCC)   Closed fracture of odontoid process of axis (HCC)  Estimated body mass index is 18.51 kg/m as calculated from the following:   Height as of this encounter: 5' (1.524 m).   Weight as of this encounter: 43 kg. Up with therapy D/C IV fluids when tolerating PO intake.  Labs and vitals reviewed.  WBC down to 9.1 Hg 11.3, improving and stable. Continue to monitor. Up with therapy as much as can tolerated today. Patient has had a BM. History of dementia, denies pain this AM.  Able to rouse and answer questions this AM  Staples can be removed by SNF on 02/26/24.  Follow-up with [facility] in 6 weeks for x-rays of the left hip. Following discharge, continue Lovenox  30mg  daily for 14 days for DVT prophylaxis.  Ortho will sign off at this time.  DVT Prophylaxis - Lovenox  and TED hose Weight-Bearing as tolerated to left leg  J. Gustavo Level, PA-C, CAQ-OS [facility] Orthopaedic Surgery 02/17/2024, 7:50 AM

## 2024-02-18 DIAGNOSIS — S72042A Displaced fracture of base of neck of left femur, initial encounter for closed fracture: Secondary | ICD-10-CM | POA: Diagnosis not present

## 2024-02-18 LAB — CBC
HCT: 37.9 % (ref 36.0–46.0)
Hemoglobin: 12.4 g/dL (ref 12.0–15.0)
MCH: 30.1 pg (ref 26.0–34.0)
MCHC: 32.7 g/dL (ref 30.0–36.0)
MCV: 92 fL (ref 80.0–100.0)
Platelets: 232 K/uL (ref 150–400)
RBC: 4.12 MIL/uL (ref 3.87–5.11)
RDW: 13.5 % (ref 11.5–15.5)
WBC: 12.4 K/uL — ABNORMAL HIGH (ref 4.0–10.5)
nRBC: 0 % (ref 0.0–0.2)

## 2024-02-18 LAB — BASIC METABOLIC PANEL WITH GFR
Anion gap: 9 (ref 5–15)
BUN: 21 mg/dL (ref 8–23)
CO2: 24 mmol/L (ref 22–32)
Calcium: 8.5 mg/dL — ABNORMAL LOW (ref 8.9–10.3)
Chloride: 106 mmol/L (ref 98–111)
Creatinine, Ser: 0.61 mg/dL (ref 0.44–1.00)
GFR, Estimated: >60 mL/min (ref >60)
Glucose, Bld: 105 mg/dL — ABNORMAL HIGH (ref 70–99)
Potassium: 4 mmol/L (ref 3.5–5.1)
Sodium: 139 mmol/L (ref 135–145)

## 2024-02-18 LAB — MAGNESIUM: Magnesium: 2.3 mg/dL (ref 1.7–2.4)

## 2024-02-18 LAB — PHOSPHORUS: Phosphorus: 2.9 mg/dL (ref 2.5–4.6)

## 2024-02-18 MED ORDER — VITAMIN B-12 1000 MCG PO TABS: 1000.0000 ug | ORAL_TABLET | Freq: Every day | ORAL | Status: AC

## 2024-02-18 MED ORDER — HYDRALAZINE HCL 50 MG PO TABS: 50.0000 mg | ORAL_TABLET | Freq: Three times a day (TID) | ORAL | Status: AC | PRN

## 2024-02-18 MED ORDER — BISACODYL 5 MG PO TBEC: 10.0000 mg | DELAYED_RELEASE_TABLET | Freq: Every day | ORAL | Status: DC

## 2024-02-18 MED ORDER — POLYETHYLENE GLYCOL 3350 17 G PO PACK
17.0000 g | PACK | Freq: Two times a day (BID) | ORAL | Status: DC
  Administered 2024-02-18: 17 g via ORAL

## 2024-02-18 MED ORDER — AMLODIPINE BESYLATE 10 MG PO TABS: 10.0000 mg | ORAL_TABLET | Freq: Every day | ORAL | Status: AC

## 2024-02-18 MED ORDER — BISACODYL 10 MG RE SUPP: 10.0000 mg | Freq: Every day | RECTAL | Status: AC | PRN

## 2024-02-18 MED ORDER — ACETAMINOPHEN 325 MG PO TABS: 650.0000 mg | ORAL_TABLET | Freq: Four times a day (QID) | ORAL | Status: AC | PRN

## 2024-02-18 NOTE — Plan of Care (Signed)

## 2024-02-18 NOTE — Discharge Summary (Signed)
 [facility] Discharge Summary   Patient: Anne Oneill FMW:[id number]  PCP: [facility]  Date of admission: 02/11/2024   Date of discharge:  02/18/2024     Discharge Diagnoses:  Principal Problem:   Closed left femoral fracture (HCC) Active Problems:   Mixed Alzheimer's and vascular dementia (HCC)   Osteoporosis   Multiple closed fractures of cervical vertebrae (HCC)   History of breast cancer   History of TIA (transient ischemic attack)   Hyperlipidemia   Hypertensive urgency   Thrombocytopenia (HCC)   Closed fracture of odontoid process of axis (HCC)   Admitted From: Memory care unit Disposition:  SNF   Recommendations for Outpatient Follow-up:  F/u with PCP, need to be seen by an MD, monitor BP and titrate medications accordingly.  Started Norvasc  10 mg p.o. daily and hydralazine  as needed Follow-up with orthopedic surgery in 2 weeks Follow-up with neurosurgery for cervical spine fracture in few weeks Follow up LABS/TEST:     Contact information for follow-up providers     Kip Lynwood Double, PA-C Follow up in 6 week(s).   Specialty: Physician Assistant Why: Kurtis can be removed by SNF on 02/26/24. If unable to be removed at facility follow-up with [facility] ortho in 10-14 days. Contact information: [address] [place] [address] [phone]              Contact information for after-discharge care     Destination     [facility] .   Service: Skilled Nursing Contact information: [address] [place]  [address] [phone]                    Diet recommendation: Dysphagia type 3 thin Liquid  Activity: The patient is advised to gradually reintroduce usual activities, as tolerated  Discharge Condition: stable  Code Status: DNR -Limited  History of present illness: As per the H and P dictated on admission. Hospital Course:  Ms.  Anne Oneill is a 89 year old female with history of mixed dementia, hyperlipidemia, osteoporosis, osteopenia, who presents emergency department for chief concerns of unwitnessed fall from [place] [facility] memory care unit.   Vitals in the ED showed temperature of 99.6, respiration rate 17, heart rate 79, blood pressure 208/98, SpO2 of 94% on 4 L nasal cannula.   Serum sodium is 137, potassium 4.4, chloride 103, bicarb 23, BUN of 29, serum creatinine of 0.77, EGFR greater than 60, nonfasting blood glucose 126, WBC 7.9, hemoglobin 13.9, platelet 211.   ED treatment: Morphine  4 mg IV one-time dose, ondansetron  4 mg IV one-time dose, sodium chloride  500 mL liter bolus.   EDP discussed case with Dr. Katrina, who states he will see the patient and family to discuss his recommendations.  EDP also consulted orthopedic surgeon, Dr. Edie will see the patient.   Assessment and Plan:   # Left femoral fracture, closed due to fall Continue prn meds for pain control Orthopedic surgery consulted, s/p Left hip hemiarthroplasty  done on 9/3. S/p Lovenox  given during hospital stay.  Noticed to that patient was on Eliquis  prior to admission so Eliquis  was resumed. Follow-up with orthopedic surgery in 1 to 2 weeks per   # Multiple closed fractures of cervical vertebrae  Seen by neurosurgery, recommended no surgical intervention, continue cervical collar remains out of bed. She should remove her collar to eat and when asleep.  She is cleared for hip surgery, recommended she stay in her collar for  intubation and throughout the case.  She can then have her collar removed in the recovery area.     # Hypertensive urgency Resumed home dose Toprol -XL 50 mg p.o. daily and Aldactone  37.5 mg p.o. daily.  Started amlodipine  10 mg p.o. daily due to elevated blood pressure.  Use hydralazine  as needed.  Monitor BP and titrate medications accordingly.   # Hyperlipidemia: Does not take simvastatin  at home so DC'd #  Hypophosphatemia secondary to nutritional deficiency Phos repleted. Resolved  # Metabolic acidosis, bicarbonate 50 mEq IV x 1 dose given on 9/4.  Metabolic acidosis resolved # Dysphagia: SLP eval done, diet advanced to dysphagia 3 diet   # Mixed Alzheimer's and vascular dementia: Patient does not take Namenda  and Aricept  anymore.  So DC'd medications on discharge.  Continue supportive care # Constipation, continue laxatives.  Body mass index is 18.51 kg/m.  Nutrition Problem: Increased nutrient needs Etiology: post-op healing Nutrition Interventions: Interventions: Nepro shake, MVI, Magic cup   Pain control  - [place]  Controlled Substance Reporting System database could not be reviewed, as website was not working. - Oxycodone  prescription was printed by orthopedic surgery  - Patient was instructed, not to drive, operate heavy machinery, perform activities at heights, swimming or participation in water activities or provide baby sitting services while on Pain, Sleep and Anxiety Medications; until her outpatient Physician has advised to do so again.  - Also recommended to not to take more than prescribed Pain, Sleep and Anxiety Medications.  Patient was seen by physical therapy, who recommended Therapy, SNF placement, which was arranged. On the day of the discharge the patient's vitals were stable, and no other acute medical condition were reported by patient. the patient was felt safe to be discharge at SNF with Therapy.  Consultants: Neurosurgery, orthopedic surgery Procedures: s/p left hip ORIF  Discharge Exam: General: Appear in no distress. Oral Mucosa Clear, moist. Cardiovascular: S1 and S2 Present, no Murmur, Respiratory: normal respiratory effort, Bilateral Air entry present and no Crackles, no wheezes Abdomen: Bowel Sound present, Soft and no tenderness. Extremities: no Pedal edema, no calf tenderness Neurology: No focal deficits, dementia AAO x 1  affect  appropriate.  Filed Weights   02/11/24 1716  Weight: 43 kg   Vitals:   02/18/24 0356 02/18/24 0816  BP: (!) 146/64 (!) 173/75  Pulse: 75 78  Resp: 16 15  Temp: 98.2 F (36.8 C) 98.2 F (36.8 C)  SpO2: 98% 98%    DISCHARGE MEDICATION: Allergies as of 02/18/2024       Reactions   Codeine Nausea Only   Ditropan [oxybutynin]    Hydrocodone Nausea Only   Morphine  Nausea Only   Latex Rash        Medication List     STOP taking these medications    calcium -vitamin D  500-5 MG-MCG tablet Commonly known as: OSCAL WITH D   donepezil  10 MG tablet Commonly known as: ARICEPT    HYDROmorphone  2 MG tablet Commonly known as: DILAUDID    memantine  10 MG tablet Commonly known as: NAMENDA    mirabegron  ER 50 MG Tb24 tablet Commonly known as: MYRBETRIQ    nitrofurantoin (macrocrystal-monohydrate) 100 MG capsule Commonly known as: MACROBID   ondansetron  4 MG tablet Commonly known as: ZOFRAN    PRESERVISION AREDS 2+MULTI VIT PO   simvastatin  20 MG tablet Commonly known as: ZOCOR    traMADol  50 MG tablet Commonly known as: ULTRAM        TAKE these medications    acetaminophen  325 MG tablet Commonly known  as: TYLENOL  Take 2 tablets (650 mg total) by mouth every 6 (six) hours as needed for mild pain (pain score 1-3), fever or headache. What changed:  medication strength how much to take when to take this reasons to take this   amLODipine  10 MG tablet Commonly known as: NORVASC  Take 1 tablet (10 mg total) by mouth daily. Start taking on: February 19, 2024   apixaban  2.5 MG Tabs tablet Commonly known as: ELIQUIS  Take 2.5 mg by mouth 2 (two) times daily.   bisacodyl  10 MG suppository Commonly known as: DULCOLAX Place 1 suppository (10 mg total) rectally daily as needed for moderate constipation (if no results with lactulose ).   cyanocobalamin  1000 MCG tablet Commonly known as: VITAMIN B12 Take 1 tablet (1,000 mcg total) by mouth daily.   escitalopram  20 MG  tablet Commonly known as: LEXAPRO  Take 10 mg by mouth daily.   feeding supplement Liqd Take 237 mLs by mouth 3 (three) times daily between meals.   hydrALAZINE  50 MG tablet Commonly known as: APRESOLINE  Take 1 tablet (50 mg total) by mouth 3 (three) times daily as needed (SBP >150).   metoprolol  succinate 50 MG 24 hr tablet Commonly known as: TOPROL -XL Take 50 mg by mouth daily.   oxyCODONE  5 MG immediate release tablet Commonly known as: Oxy IR/ROXICODONE  Take 1 tablet (5 mg total) by mouth every 6 (six) hours as needed for moderate pain (pain score 4-6). What changed:  how much to take when to take this reasons to take this   polyethylene glycol 17 g packet Commonly known as: MIRALAX  / GLYCOLAX  Take 17 g by mouth daily as needed.   senna 8.6 MG Tabs tablet Commonly known as: SENOKOT Take 1 tablet by mouth at bedtime.   spironolactone  25 MG tablet Commonly known as: ALDACTONE  Take 37.5 mg by mouth daily.               Discharge Care Instructions  (From admission, onward)           Start     Ordered   02/18/24 0000  Discharge wound care:       Comments: As per orthopedic surgery   02/18/24 1245           Allergies  Allergen Reactions   Codeine Nausea Only   Ditropan [Oxybutynin]    Hydrocodone Nausea Only   Morphine  Nausea Only   Latex Rash   Discharge Instructions     Call MD for:  difficulty breathing, headache or visual disturbances   Complete by: As directed    Call MD for:  extreme fatigue   Complete by: As directed    Call MD for:  persistant dizziness or light-headedness   Complete by: As directed    Call MD for:  persistant nausea and vomiting   Complete by: As directed    Call MD for:  redness, tenderness, or signs of infection (pain, swelling, redness, odor or green/yellow discharge around incision site)   Complete by: As directed    Call MD for:  severe uncontrolled pain   Complete by: As directed    Call MD for:   temperature >100.4   Complete by: As directed    Discharge instructions   Complete by: As directed    F/u with PCP, need to be seen by an MD, monitor BP and titrate medications accordingly.  Started Norvasc  10 mg p.o. daily and hydralazine  as needed Follow-up with orthopedic surgery in 2 weeks Follow-up with neurosurgery for  cervical spine fracture in few weeks   Discharge wound care:   Complete by: As directed    As per orthopedic surgery   Increase activity slowly   Complete by: As directed        The results of significant diagnostics from this hospitalization (including imaging, microbiology, ancillary and laboratory) are listed below for reference.    Significant Diagnostic Studies: DG HIP UNILAT W OR W/O PELVIS 2-3 VIEWS LEFT Result Date: 02/12/2024 CLINICAL DATA:  Status post hip hemiarthroplasty. EXAM: DG HIP (WITH OR WITHOUT PELVIS) 2-3V LEFT COMPARISON:  Preoperative exam. FINDINGS: Left hip hemiarthroplasty in expected alignment. No periprosthetic lucency or fracture. Recent postsurgical change includes air and edema in the soft tissues. Overlying skin staples in place. Right femoral intramedullary nail is partially included in the field of view. IMPRESSION: Left hip hemiarthroplasty without immediate postoperative complication. Electronically Signed   By: Andrea Gasman M.D.   On: 02/12/2024 19:23   CT Head Wo Contrast Result Date: 02/11/2024 CLINICAL DATA:  Head trauma, minor (Age >= 31y) 89 year old post trauma. Unwitnessed fall. EXAM: CT HEAD WITHOUT CONTRAST TECHNIQUE: Contiguous axial images were obtained from the base of the skull through the vertex without intravenous contrast. RADIATION DOSE REDUCTION: This exam was performed according to the departmental dose-optimization program which includes automated exposure control, adjustment of the mA and/or kV according to patient size and/or use of iterative reconstruction technique. COMPARISON:  Most recent head CT 01/07/2024  FINDINGS: Brain: No intracranial hemorrhage, mass effect, or midline shift. Stable atrophy and chronic small vessel ischemia. No hydrocephalus. The basilar cisterns are patent. Chronic left cerebellar calcification. No evidence of territorial infarct or acute ischemia. No extra-axial or intracranial fluid collection. Vascular: Atherosclerosis of skullbase vasculature without hyperdense vessel or abnormal calcification. Skull: No fracture or focal lesion. Sinuses/Orbits: No acute finding. Other: No confluent scalp hematoma. IMPRESSION: 1. No acute intracranial abnormality. No skull fracture. 2. Stable atrophy and chronic small vessel ischemia. Electronically Signed   By: Andrea Gasman M.D.   On: 02/11/2024 18:55   DG Chest 1 View Result Date: 02/11/2024 CLINICAL DATA:  Fall and left hip pain. EXAM: DG CHEST 1V COMPARISON:  Chest radiograph dated 07/27/2022. FINDINGS: No focal consolidation, pleural effusion or pneumothorax. Mild cardiomegaly. Atherosclerotic calcification of the aorta. No acute osseous pathology. IMPRESSION: 1. No active disease. 2. Mild cardiomegaly. Electronically Signed   By: Vanetta Chou M.D.   On: 02/11/2024 18:51   DG Hip Unilat W or Wo Pelvis 2-3 Views Left Result Date: 02/11/2024 CLINICAL DATA:  Fall and left hip pain. EXAM: DG HIP (WITH OR WITHOUT PELVIS) 2-3V LEFT COMPARISON:  None Available. FINDINGS: Mildly displaced and impacted fracture of the left femoral neck. The bones are osteopenic. Partially visualized ORIF of the right femoral neck. The soft tissues are unremarkable. IMPRESSION: Mildly displaced and impacted fracture of the left femoral neck. Electronically Signed   By: Vanetta Chou M.D.   On: 02/11/2024 18:51   CT Cervical Spine Wo Contrast Result Date: 02/11/2024 CLINICAL DATA:  Provided history: Neck trauma. Unwitnessed fall. EXAM: CT CERVICAL SPINE WITHOUT CONTRAST TECHNIQUE: Multidetector CT imaging of the cervical spine was performed without intravenous  contrast. Multiplanar CT image reconstructions were also generated. RADIATION DOSE REDUCTION: This exam was performed according to the departmental dose-optimization program which includes automated exposure control, adjustment of the mA and/or kV according to patient size and/or use of iterative reconstruction technique. COMPARISON:  Cervical spine MRI 01/07/2024. Cervical spine CT 01/07/2024. FINDINGS: Alignment: Since the  prior cervical spine CT of 01/07/2024, there has been progressive displacement at site of the known fracture of the C1 anterior arch (located just to the left of midline). Displacement at this site now measures 4 mm. There is now asymmetric widening of the lateral atlantodental interval on the left (measuring 6 mm) (series 4, image 21). 3 mm C3-C4 grade 1 anterolisthesis. 4 mm C4-C5 grade 1 anterolisthesis. 2 mm C5-C6 grade 1 anterolisthesis. 2 mm C7-T1 grade 1 anterolisthesis. Slight T1-T2 grade 1 anterolisthesis. 4 mm bony retropulsion at the level of the T3 superior endplate. Nonspecific reversal of the expected cervical lordosis. Dextrocurvature of the cervical spine. Levocurvature of the upper thoracic spine. Skull base and vertebrae: New nondisplaced acute fracture of the C1 posterior arch on the left (best appreciated on series 7, images 20 in 21). New acute nondisplaced type III odontoid fracture. Redemonstrated T3 vertebral compression fracture. Approximately 60% vertebral body height loss at this level, unchanged from the prior CT. 4 mm bony retropulsion at the level of the T3 superior endplate, also unchanged. Soft tissues and spinal canal: No prevertebral fluid or swelling. No visible canal hematoma. Disc levels: Cervical spondylosis with multilevel disc space narrowing, disc bulges/central disc protrusions, posterior disc osteophyte complexes, uncovertebral hypertrophy and facet arthropathy. Disc space narrowing is greatest at C4-C5 (moderate-to-advanced), C5-C6 (advanced) and C6-C7  (advanced). Degenerative changes also present at the C1-C2 articulation. Upper chest: No consolidation within the imaged lung apices. No visible pneumothorax. Biapical pleuroparenchymal scarring. Other: Multinodular thyroid  gland, previously assessed with thyroid  ultrasound on 01/18/2010. Please refer to this prior examination for further description. Attempts are being made to reach the ordering provider at this time. IMPRESSION: 1. Acute, nondisplaced type III odontoid fracture. 2. Acute nondisplaced fracture of the C1 posterior arch on the left. 3. Progressive displacement at site of a known fracture within the C1 anterior arch (located just to the left of midline). Displacement at this fracture site now measures 4 mm. 4. The left lateral atlantodental interval is now widened to 6 mm. A cervical spine MRI is recommended to exclude any new ligamentous injury. 5. Unchanged T3 compression fracture (with 60% vertebral body height loss and 4 mm bony retropulsion. 6. Nonspecific reversal of the expected cervical lordosis. 7. Grade 1 spondylolisthesis at C3-C4, C4-C5, C7-T1 and T1-T2. 8. Nonspecific reversal of the expected cervical lordosis. 9. Mild dextrocurvature of the cervical spine. 10. Cervical spondylosis as described. Electronically Signed   By: Rockey Childs D.O.   On: 02/11/2024 18:24    Microbiology: No results found for this or any previous visit (from the past 240 hours).   Labs: CBC: Recent Labs  Lab 02/11/24 1719 02/12/24 0349 02/14/24 0620 02/15/24 0557 02/16/24 0617 02/17/24 0348 02/18/24 0714  WBC 7.9   < > 9.3 8.6 8.3 9.1 12.4*  NEUTROABS 5.6  --   --   --   --   --   --   HGB 13.9   < > 10.5* 10.3* 10.7* 11.3* 12.4  HCT 41.9   < > 31.7* 31.1* 32.5* 33.1* 37.9  MCV 92.1   < > 91.9 91.2 90.5 89.9 92.0  PLT 211   < > 161 167 182 205 232   < > = values in this interval not displayed.   Basic Metabolic Panel: Recent Labs  Lab 02/13/24 0524 02/14/24 0620 02/15/24 0557  02/16/24 0617 02/17/24 0348 02/18/24 0714  NA 138 143 140 138 135 139  K 4.2 4.1 4.0 3.6 3.8 4.0  CL  108 111 110 108 106 106  CO2 19* 24 22 25 23 24   GLUCOSE 159* 126* 119* 105* 108* 105*  BUN 23 31* 30* 19 22 21   CREATININE 0.94 0.78 0.60 0.59 0.59 0.61  CALCIUM  9.2 8.5* 8.4* 8.2* 8.1* 8.5*  MG 1.8 2.0 2.1  --  2.2 2.3  PHOS 2.1* 2.7 1.8*  --  2.1* 2.9   Liver Function Tests: Recent Labs  Lab 02/11/24 1719  AST 22  ALT 19  ALKPHOS 71  BILITOT 0.9  PROT 7.1  ALBUMIN 3.8   No results for input(s): LIPASE, AMYLASE in the last 168 hours. No results for input(s): AMMONIA in the last 168 hours. Cardiac Enzymes: No results for input(s): CKTOTAL, CKMB, CKMBINDEX, TROPONINI in the last 168 hours. BNP (last 3 results) No results for input(s): BNP in the last 8760 hours. CBG: No results for input(s): GLUCAP in the last 168 hours.  Time spent: 35 minutes  Signed:  Elvan Sor  [facility]  02/18/2024 12:46 PM

## 2024-02-24 ENCOUNTER — Ambulatory Visit: Admitting: Physician Assistant

## 2024-03-22 NOTE — Progress Notes (Unsigned)
 Referring Physician:  Fernande Ophelia JINNY DOUGLAS, MD [address] [facility] [place],  [place] [address]  Primary Physician:  [facility]  History of Present Illness: 03/25/2024 Anne Oneill has a history of advanced dementia, HTN, osteoporosis, breast CA, DVT, TIA, hyperlipidemia.   Seen by Dr. Clois on 02/11/24 for hospital consult for type II odontoid fracture and burst fracture of C1 vertebral body. These fractures were seen in imaging on 02/11/24, but he felt she likely had both fractures after her fall on 01/07/24.   Surgery not recommended due to advanced dementia. She was to wear collar when out of bed. She could remove it to eat and sleep.   She also had left hip bipolar hemiarthroplasty by Dr. Edie on 02/12/24.   She is here for follow up. Her daughter Anne Oneill helps with history as needed.   She does not complain of any pain, numbness, or tingling. She has been using walker to ambulate. Currently, she needs to use WC when she goes any further than from bed to bathroom per order at Memory Unit.   Review of Systems:  A 10 point review of systems is negative, except for the pertinent positives and negatives detailed in the HPI.  Past Medical History: Past Medical History:  Diagnosis Date   Arthritis    Cancer (HCC)    Dementia (HCC)    mild   DVT (deep venous thrombosis) (HCC)    High cholesterol    Hyperlipemia    Osteoporosis    Squamous cell carcinoma of skin 02/19/2007   R cheek    Past Surgical History: Past Surgical History:  Procedure Laterality Date   ABDOMINAL HYSTERECTOMY     EYE SURGERY     FRACTURE SURGERY     HIP ARTHROPLASTY Left 02/12/2024   Procedure: HEMIARTHROPLASTY (BIPOLAR) HIP, POSTERIOR APPROACH FOR FRACTURE;  Surgeon: Edie Norleen JINNY, MD;  Location: [facility] ORS;  Service: Orthopedics;  Laterality: Left;   MASTECTOMY Right    SKIN BIOPSY      Allergies: Allergies as of 03/25/2024 - Review Complete 03/25/2024   Allergen Reaction Noted   Codeine Nausea Only 12/11/2017   Ditropan [oxybutynin]  11/23/2019   Hydrocodone Nausea Only 12/11/2017   Morphine  Nausea Only 12/11/2017   Oxybutynin chloride Other (See Comments) 02/18/2024   Latex Rash 12/11/2017    Medications: Outpatient Encounter Medications as of 03/25/2024  Medication Sig   acetaminophen  (TYLENOL ) 325 MG tablet Take 2 tablets (650 mg total) by mouth every 6 (six) hours as needed for mild pain (pain score 1-3), fever or headache.   amLODipine  (NORVASC ) 10 MG tablet Take 1 tablet (10 mg total) by mouth daily.   apixaban  (ELIQUIS ) 2.5 MG TABS tablet Take 2.5 mg by mouth 2 (two) times daily.   bisacodyl  (DULCOLAX) 10 MG suppository Place 1 suppository (10 mg total) rectally daily as needed for moderate constipation (if no results with lactulose ).   cyanocobalamin  (VITAMIN B12) 1000 MCG tablet Take 1 tablet (1,000 mcg total) by mouth daily.   escitalopram  (LEXAPRO ) 10 MG tablet Take 10 mg by mouth daily.   hydrALAZINE  (APRESOLINE ) 50 MG tablet Take 1 tablet (50 mg total) by mouth 3 (three) times daily as needed (SBP >150).   metoprolol  succinate (TOPROL -XL) 50 MG 24 hr tablet Take 50 mg by mouth daily.   oxyCODONE  (OXY IR/ROXICODONE ) 5 MG immediate release tablet Take 1 tablet (5 mg total) by mouth every 6 (six) hours as needed for moderate pain (pain  score 4-6).   polyethylene glycol (MIRALAX  / GLYCOLAX ) 17 g packet Take 17 g by mouth daily as needed.   spironolactone  (ALDACTONE ) 25 MG tablet Take 37.5 mg by mouth daily.   [DISCONTINUED] escitalopram  (LEXAPRO ) 20 MG tablet Take 10 mg by mouth daily.   [DISCONTINUED] feeding supplement (ENSURE ENLIVE / ENSURE PLUS) LIQD Take 237 mLs by mouth 3 (three) times daily between meals.   [DISCONTINUED] senna (SENOKOT) 8.6 MG TABS tablet Take 1 tablet by mouth at bedtime.   No facility-administered encounter medications on file as of 03/25/2024.    Social History: Social History   Tobacco Use    Smoking status: Never   Smokeless tobacco: Never  Substance Use Topics   Alcohol use: Not Currently   Drug use: Never    Family Medical History: Family History  Problem Relation Age of Onset   Stroke Father    Heart disease Father     Physical Examination: Vitals:   03/25/24 1304  BP: 104/62    She is alert. She follows commands and answers questions appropriately.   No posterior cervical tenderness. No tenderness in bilateral trapezial region.   No abnormal lesions on exposed skin.   Strength: Side Biceps Triceps Deltoid Interossei Grip Wrist Ext. Wrist Flex.  R 5 5 5 5 5 5 5   L 5 5 5 5 5 5 5    Side Iliopsoas Quads Hamstring PF DF EHL  R 5 5 5 5 5 5   L 5 5 5 5 5 5    Reflexes are 2+ and symmetric at the biceps, brachioradialis, patella and achilles.   Hoffman's is absent.  Clonus is not present.   Bilateral upper and lower extremity sensation is intact to light touch.     She is in a WC. Gait not tested.   She is wearing a hard cervical collar.   Medical Decision Making  Imaging: Cervical xrays dated 03/25/24:  Difficult to visualize fractures.   Report not yet available for above xrays. Above xrays reviewed with Dr. [name].   Assessment and Plan: Anne Oneill has type II odontoid fracture and burst fracture of C1 vertebral body likely since fall on 01/07/24. Both fractures seen on imaging on 02/11/24 as well.   She has no neck or arm pain. No numbness, tingling, or weakness. She has known dementia and her daughter Anne Oneill helps with her history.   Initial AP/lateral xrays from today showed difficulty in visualizing fractures.   Treatment options discussed with patient and following plan made:   - Patient was reviewed with Dr. [name] along with her initial cervical xrays and he recommended cervical flex/ext xrays.  - She was able to get these xrays at our office but her daughter had to leave right after to get to another appointment.  - Note sent to facility that  explaining she had xray results pending. She should continue with collar. Can remove to sleep and eat. Can go bed to bathroom with assistance, but needs WC for any longer distance.  - Will review xrays with Dr. Clois tomorrow and send updated note/orders to facility. Will also call daughter and discuss follow up.   I spent a total of 35 minutes in face-to-face and non-face-to-face activities related to this patient's care today including review of outside records, review of imaging, review of symptoms, physical exam, discussion of differential diagnosis, discussion of treatment options, and documentation.   Glade Boys PA-C Dept. of Neurosurgery

## 2024-03-25 ENCOUNTER — Encounter: Payer: Self-pay | Admitting: Orthopedic Surgery

## 2024-03-25 ENCOUNTER — Ambulatory Visit

## 2024-03-25 ENCOUNTER — Other Ambulatory Visit: Payer: Self-pay | Admitting: Orthopedic Surgery

## 2024-03-25 ENCOUNTER — Ambulatory Visit: Admitting: Orthopedic Surgery

## 2024-03-25 VITALS — BP 104/62 | Ht 60.0 in | Wt 120.0 lb

## 2024-03-25 DIAGNOSIS — R296 Repeated falls: Secondary | ICD-10-CM

## 2024-03-25 DIAGNOSIS — S12100D Unspecified displaced fracture of second cervical vertebra, subsequent encounter for fracture with routine healing: Secondary | ICD-10-CM

## 2024-03-25 DIAGNOSIS — S12000D Unspecified displaced fracture of first cervical vertebra, subsequent encounter for fracture with routine healing: Secondary | ICD-10-CM

## 2024-03-25 DIAGNOSIS — S1201XD Stable burst fracture of first cervical vertebra, subsequent encounter for fracture with routine healing: Secondary | ICD-10-CM | POA: Diagnosis not present

## 2024-03-25 DIAGNOSIS — S12112D Nondisplaced Type II dens fracture, subsequent encounter for fracture with routine healing: Secondary | ICD-10-CM | POA: Diagnosis not present

## 2024-03-25 DIAGNOSIS — S14109D Unspecified injury at unspecified level of cervical spinal cord, subsequent encounter: Secondary | ICD-10-CM | POA: Diagnosis not present

## 2024-03-26 NOTE — Addendum Note (Signed)
 Addended byBETHA HILMA HASTINGS on: 03/26/2024 04:11 PM   Modules accepted: Orders

## 2024-05-06 ENCOUNTER — Inpatient Hospital Stay: Admission: RE | Admit: 2024-05-06 | Source: Ambulatory Visit

## 2024-05-28 NOTE — Addendum Note (Signed)
 Addended byBETHA HILMA HASTINGS on: 05/28/2024 12:37 PM   Modules accepted: Orders

## 2024-05-28 NOTE — Telephone Encounter (Signed)
 Please schedule Anne Oneill for cervical xrays and then schedule Anne Oneill a week later to see me (would prefer to see on a [place] day).   If she can't come 2 separate days, then definitely have Anne Oneill see me on a day [name] is in clinic with me. She can do xrays prior.

## 2024-06-02 ENCOUNTER — Emergency Department

## 2024-06-02 ENCOUNTER — Other Ambulatory Visit: Payer: Self-pay

## 2024-06-02 ENCOUNTER — Encounter: Payer: Self-pay | Admitting: Internal Medicine

## 2024-06-02 ENCOUNTER — Inpatient Hospital Stay
Admission: EM | Admit: 2024-06-02 | Discharge: 2024-06-12 | DRG: 092 | Disposition: A | Discharge status: Skilled Nursing Facility | Attending: Student | Admitting: Student

## 2024-06-02 DIAGNOSIS — S12090A Other displaced fracture of first cervical vertebra, initial encounter for closed fracture: Secondary | ICD-10-CM | POA: Diagnosis present

## 2024-06-02 DIAGNOSIS — Z91040 Latex allergy status: Secondary | ICD-10-CM

## 2024-06-02 DIAGNOSIS — G309 Alzheimer's disease, unspecified: Secondary | ICD-10-CM | POA: Diagnosis present

## 2024-06-02 DIAGNOSIS — W19XXXA Unspecified fall, initial encounter: Principal | ICD-10-CM | POA: Diagnosis present

## 2024-06-02 DIAGNOSIS — S32040A Wedge compression fracture of fourth lumbar vertebra, initial encounter for closed fracture: Secondary | ICD-10-CM | POA: Diagnosis present

## 2024-06-02 DIAGNOSIS — S12030A Displaced posterior arch fracture of first cervical vertebra, initial encounter for closed fracture: Secondary | ICD-10-CM | POA: Diagnosis present

## 2024-06-02 DIAGNOSIS — Z96642 Presence of left artificial hip joint: Secondary | ICD-10-CM | POA: Diagnosis present

## 2024-06-02 DIAGNOSIS — Z8249 Family history of ischemic heart disease and other diseases of the circulatory system: Secondary | ICD-10-CM

## 2024-06-02 DIAGNOSIS — M25551 Pain in right hip: Secondary | ICD-10-CM

## 2024-06-02 DIAGNOSIS — S129XXA Fracture of neck, unspecified, initial encounter: Secondary | ICD-10-CM | POA: Diagnosis present

## 2024-06-02 DIAGNOSIS — F0150 Vascular dementia without behavioral disturbance: Secondary | ICD-10-CM | POA: Diagnosis not present

## 2024-06-02 DIAGNOSIS — S32049A Unspecified fracture of fourth lumbar vertebra, initial encounter for closed fracture: Secondary | ICD-10-CM

## 2024-06-02 DIAGNOSIS — I10 Essential (primary) hypertension: Secondary | ICD-10-CM | POA: Diagnosis not present

## 2024-06-02 DIAGNOSIS — E7800 Pure hypercholesterolemia, unspecified: Secondary | ICD-10-CM | POA: Diagnosis present

## 2024-06-02 DIAGNOSIS — Z885 Allergy status to narcotic agent status: Secondary | ICD-10-CM

## 2024-06-02 DIAGNOSIS — F0280 Dementia in other diseases classified elsewhere without behavioral disturbance: Secondary | ICD-10-CM | POA: Diagnosis present

## 2024-06-02 DIAGNOSIS — M545 Low back pain, unspecified: Secondary | ICD-10-CM

## 2024-06-02 DIAGNOSIS — Z9011 Acquired absence of right breast and nipple: Secondary | ICD-10-CM

## 2024-06-02 DIAGNOSIS — D696 Thrombocytopenia, unspecified: Secondary | ICD-10-CM | POA: Diagnosis not present

## 2024-06-02 DIAGNOSIS — S32040G Wedge compression fracture of fourth lumbar vertebra, subsequent encounter for fracture with delayed healing: Secondary | ICD-10-CM | POA: Diagnosis not present

## 2024-06-02 DIAGNOSIS — Z66 Do not resuscitate: Secondary | ICD-10-CM | POA: Diagnosis present

## 2024-06-02 DIAGNOSIS — Z86718 Personal history of other venous thrombosis and embolism: Secondary | ICD-10-CM

## 2024-06-02 DIAGNOSIS — Z85828 Personal history of other malignant neoplasm of skin: Secondary | ICD-10-CM

## 2024-06-02 DIAGNOSIS — S22030G Wedge compression fracture of third thoracic vertebra, subsequent encounter for fracture with delayed healing: Secondary | ICD-10-CM | POA: Diagnosis not present

## 2024-06-02 DIAGNOSIS — S32501A Unspecified fracture of right pubis, initial encounter for closed fracture: Secondary | ICD-10-CM | POA: Diagnosis present

## 2024-06-02 DIAGNOSIS — Z79899 Other long term (current) drug therapy: Secondary | ICD-10-CM

## 2024-06-02 DIAGNOSIS — I700 Atherosclerosis of aorta: Secondary | ICD-10-CM | POA: Diagnosis present

## 2024-06-02 DIAGNOSIS — M4856XA Collapsed vertebra, not elsewhere classified, lumbar region, initial encounter for fracture: Secondary | ICD-10-CM | POA: Diagnosis present

## 2024-06-02 DIAGNOSIS — Z515 Encounter for palliative care: Secondary | ICD-10-CM

## 2024-06-02 DIAGNOSIS — Z888 Allergy status to other drugs, medicaments and biological substances status: Secondary | ICD-10-CM

## 2024-06-02 DIAGNOSIS — Z7901 Long term (current) use of anticoagulants: Secondary | ICD-10-CM

## 2024-06-02 DIAGNOSIS — S12101G Unspecified nondisplaced fracture of second cervical vertebra, subsequent encounter for fracture with delayed healing: Secondary | ICD-10-CM | POA: Diagnosis not present

## 2024-06-02 DIAGNOSIS — S22030A Wedge compression fracture of third thoracic vertebra, initial encounter for closed fracture: Secondary | ICD-10-CM | POA: Diagnosis present

## 2024-06-02 DIAGNOSIS — R296 Repeated falls: Secondary | ICD-10-CM | POA: Diagnosis not present

## 2024-06-02 DIAGNOSIS — Z90710 Acquired absence of both cervix and uterus: Secondary | ICD-10-CM

## 2024-06-02 LAB — CBC
HCT: 38 % (ref 36.0–46.0)
Hemoglobin: 12.4 g/dL (ref 12.0–15.0)
MCH: 29.3 pg (ref 26.0–34.0)
MCHC: 32.6 g/dL (ref 30.0–36.0)
MCV: 89.8 fL (ref 80.0–100.0)
Platelets: 230 K/uL (ref 150–400)
RBC: 4.23 MIL/uL (ref 3.87–5.11)
RDW: 12.6 % (ref 11.5–15.5)
WBC: 9 K/uL (ref 4.0–10.5)
nRBC: 0 % (ref 0.0–0.2)

## 2024-06-02 LAB — BASIC METABOLIC PANEL WITH GFR
Anion gap: 12 (ref 5–15)
BUN: 19 mg/dL (ref 8–23)
CO2: 24 mmol/L (ref 22–32)
Calcium: 9.4 mg/dL (ref 8.9–10.3)
Chloride: 103 mmol/L (ref 98–111)
Creatinine, Ser: 0.85 mg/dL (ref 0.44–1.00)
GFR, Estimated: >60 mL/min
Glucose, Bld: 114 mg/dL — ABNORMAL HIGH (ref 70–99)
Potassium: 4.9 mmol/L (ref 3.5–5.1)
Sodium: 138 mmol/L (ref 135–145)

## 2024-06-02 MED ORDER — OXYCODONE-ACETAMINOPHEN 5-325 MG PO TABS
1.0000 | ORAL_TABLET | ORAL | Status: DC | PRN
  Administered 2024-06-03 – 2024-06-06 (×5): 1 via ORAL
  Filled 2024-06-02 (×5): qty 1

## 2024-06-02 MED ORDER — METHOCARBAMOL 500 MG PO TABS
500.0000 mg | ORAL_TABLET | Freq: Three times a day (TID) | ORAL | Status: DC | PRN
  Administered 2024-06-07: 500 mg via ORAL
  Filled 2024-06-02: qty 1

## 2024-06-02 MED ORDER — ONDANSETRON HCL 4 MG/2ML IJ SOLN
4.0000 mg | Freq: Once | INTRAMUSCULAR | Status: AC
  Administered 2024-06-02: 4 mg via INTRAVENOUS
  Filled 2024-06-02: qty 2

## 2024-06-02 MED ORDER — ONDANSETRON HCL 4 MG/2ML IJ SOLN: 4.0000 mg | Freq: Three times a day (TID) | INTRAMUSCULAR | Status: DC | PRN

## 2024-06-02 MED ORDER — HYDROMORPHONE HCL 1 MG/ML IJ SOLN
0.5000 mg | Freq: Once | INTRAMUSCULAR | Status: AC
  Administered 2024-06-02: 0.5 mg via INTRAVENOUS
  Filled 2024-06-02: qty 0.5

## 2024-06-02 MED ORDER — FENTANYL CITRATE (PF) 50 MCG/ML IJ SOSY: 12.5000 ug | PREFILLED_SYRINGE | INTRAMUSCULAR | Status: DC | PRN

## 2024-06-02 MED ORDER — ACETAMINOPHEN 325 MG PO TABS
650.0000 mg | ORAL_TABLET | Freq: Four times a day (QID) | ORAL | Status: DC | PRN
  Administered 2024-06-06 – 2024-06-08 (×3): 650 mg via ORAL
  Filled 2024-06-02 (×2): qty 2

## 2024-06-02 MED ORDER — ENOXAPARIN SODIUM 40 MG/0.4ML IJ SOSY: 40.0000 mg | PREFILLED_SYRINGE | INTRAMUSCULAR | Status: DC

## 2024-06-02 MED ORDER — HYDRALAZINE HCL 20 MG/ML IJ SOLN: 5.0000 mg | INTRAMUSCULAR | Status: DC | PRN

## 2024-06-02 MED ORDER — LIDOCAINE 5 % EX PTCH
1.0000 | MEDICATED_PATCH | CUTANEOUS | Status: DC
  Administered 2024-06-03 – 2024-06-11 (×9): 1 via TRANSDERMAL
  Filled 2024-06-02 (×5): qty 1

## 2024-06-02 MED ORDER — SENNOSIDES-DOCUSATE SODIUM 8.6-50 MG PO TABS
1.0000 | ORAL_TABLET | Freq: Two times a day (BID) | ORAL | Status: DC | PRN
  Administered 2024-06-04 – 2024-06-07 (×2): 1 via ORAL
  Filled 2024-06-02 (×2): qty 1

## 2024-06-02 NOTE — ED Notes (Signed)
 Removed nasal cannula as pt is not typically on oxygen. Pt de-stated to 90-91% on room air. Was placed back on 1 L [place]. Family at bedside sts pt has a fall with secondary nasal fx July 29th this year. Has had increased work of breathing since.

## 2024-06-02 NOTE — ED Triage Notes (Signed)
 Pt arrives via EMS from [facility] for unwitnessed fall. Pt has had multiple falls recently. Pt is on hospice. Pt c/o R hip/groin pain and lower back pain. Pt is supposed to wear a c-collar from previous C1-C2 fx. Staff reported to EMS she was not wearing her collar when she fell today. Pt reports she bumped her head, is on eliquis .

## 2024-06-02 NOTE — Progress Notes (Signed)
 RN reached out to Anne Oneill, ED Charge RN to confirm admission; patient being admitted from memory care unit where she is on comfort care with hospice services. She has had multiple falls; not a surgical candidate but being admitted for pain control and placement. Per ED providers, she will be admitted for pain control and placement, and per [facility], hospice services will not be affected by admission. Handoff completed without further delay.

## 2024-06-02 NOTE — ED Notes (Signed)
 Admitting  at bedside.

## 2024-06-02 NOTE — H&P (Addendum)
 " History and Physical    Anne Oneill FMW:[id number] DOB: September 10, 1926 DOA: 06/02/2024  Referring MD/NP/PA:   PCP: [facility]   Patient coming from:  The patient is coming from [facility]    Chief Complaint: Multiple falls  HPI: Anne Oneill is a 89 y.o. female with medical history significant of dementia, fall, known C1-C2 fx on C-collar, HTN, HLD,TIA, DVT (off Eliquis  per her daughter), thrombocytopenia, meningioma, breast cancer, under hospice care, who presents with multiple falls.  For her daughter at the bedside, patient has had multiple falls recently. Pt had another unwitnessed fall today in [facility]. Pt has pain in your lower back and the right hip. Typically pt is able to ambulate, but is unable to weightbearing on her right leg now.  Daughter states that she is incontinent at baseline.    Pt is supposed to be wearing a c-collar from previous C1-C2 fractures. She should remove her collar to eat and when asleep. Per EDP, staff had reported that pt was not wearing a collar when she fell.   Per daughter, patient does not have chest pain, abdominal pain.  No respiratory distress, cough, SOB noted.  No active nausea, vomiting, diarrhea noted.  No symptoms of UTI.  Her mental status is at baseline.  Per her daughter, patient was on Eliquis  for DVT which was discontinued by hospice recently.   Data reviewed independently and ED Course: pt was found to have WBC 9.0, GFR> 60. Temperature normal, blood pressure 133/68, heart rate 60-70s, RR 19, oxygen saturation 90-91% on room air, which improved to 95% on 1 L oxygen, temperature normal. Pt is placed in med-surg bed for obs   CT of head and C spin: 1. No CT evidence for acute intracranial abnormality. Atrophy and chronic small vessel ischemic changes of the white matter. 2. Known fractures of the anterior and posterior arch of C1, some interval osseous bridging across the anterior  arch fracture. Slight interval increase in displacement of left posterior arch fracture, now measuring 2 mm, previously nondisplaced. Similar mild lateral displacement of the left lateral mass. 3. Known type 3 odontoid fracture, with interval sclerotic margins (ununited), slight increased posterior displacement of the odontoid compared to prior measuring 3 mm, and increased fracture distraction measuring up to 5 mm 4. Chronic compression deformity of T3. No new fracture is seen.  CT of L-spine: Acute or subacute L4 fracture with 15% height loss and 3mm bony retropulsion. Mild to moderate resulting canal stenosis.  CT of pelvis: 1. No acute fracture or dislocation of the pelvis. 2. Acute appearing compression fracture of superior endplate of L4. Correlation with clinical exam and point tenderness recommended. 3. Sigmoid diverticulosis. 4.  Aortic Atherosclerosis (ICD10-I70.0).   X-ray of right hip/pelvis: 1. No acute fracture or dislocation. 2. Mildly displaced fractures of the right pubic bone with improved alignment compared to prior radiograph.     EKG: I have personally reviewed.  Sinus rhythm, QTc 450, possible bilateral atrial enlargement, nonspecific T wave change.   Review of Systems:   General: no fevers, chills, no body weight gain, has fatigue HEENT: no blurry vision, hearing changes or sore throat Respiratory: no dyspnea, coughing, wheezing CV: no chest pain, no palpitations GI: no nausea, vomiting, abdominal pain, diarrhea, constipation GU: no dysuria, burning on urination, increased urinary frequency, hematuria  Ext: no leg edema Neuro: no unilateral weakness, numbness, or tingling, no vision change or hearing loss.has multiple falls Skin: no rash,  no skin tear. MSK: has pain mainly in lower back and right hip Heme: No easy bruising.  Travel history: No recent long distant travel.   Allergy: Allergies[1]  Past Medical History:  Diagnosis Date    Arthritis    Cancer (HCC)    Dementia (HCC)    mild   DVT (deep venous thrombosis) (HCC)    High cholesterol    Hyperlipemia    Osteoporosis    Squamous cell carcinoma of skin 02/19/2007   R cheek    Past Surgical History:  Procedure Laterality Date   ABDOMINAL HYSTERECTOMY     EYE SURGERY     FRACTURE SURGERY     HIP ARTHROPLASTY Left 02/12/2024   Procedure: HEMIARTHROPLASTY (BIPOLAR) HIP, POSTERIOR APPROACH FOR FRACTURE;  Surgeon: Edie Norleen PARAS, MD;  Location: [facility] ORS;  Service: Orthopedics;  Laterality: Left;   MASTECTOMY Right    SKIN BIOPSY      Social History:  reports that she has never smoked. She has never used smokeless tobacco. She reports that she does not currently use alcohol. She reports that she does not use drugs.  Family History:  Family History  Problem Relation Age of Onset   Stroke Father    Heart disease Father      Prior to Admission medications  Medication Sig Start Date End Date Taking? Authorizing Provider  acetaminophen  (TYLENOL ) 325 MG tablet Take 2 tablets (650 mg total) by mouth every 6 (six) hours as needed for mild pain (pain score 1-3), fever or headache. 02/18/24   Von Bellis, MD  amLODipine  (NORVASC ) 10 MG tablet Take 1 tablet (10 mg total) by mouth daily. 02/19/24   Von Bellis, MD  apixaban  (ELIQUIS ) 2.5 MG TABS tablet Take 2.5 mg by mouth 2 (two) times daily.    [provider]  bisacodyl  (DULCOLAX) 10 MG suppository Place 1 suppository (10 mg total) rectally daily as needed for moderate constipation (if no results with lactulose ). 02/18/24   Von Bellis, MD  escitalopram  (LEXAPRO ) 10 MG tablet Take 10 mg by mouth daily.    [provider]  hydrALAZINE  (APRESOLINE ) 50 MG tablet Take 1 tablet (50 mg total) by mouth 3 (three) times daily as needed (SBP >150). 02/18/24   Von Bellis, MD  metoprolol  succinate (TOPROL -XL) 50 MG 24 hr tablet Take 50 mg by mouth daily. 01/20/24   [provider]  oxyCODONE  (OXY  IR/ROXICODONE ) 5 MG immediate release tablet Take 1 tablet (5 mg total) by mouth every 6 (six) hours as needed for moderate pain (pain score 4-6). 02/16/24   Drake Chew, PA-C  polyethylene glycol (MIRALAX  / GLYCOLAX ) 17 g packet Take 17 g by mouth daily as needed. 02/27/22   Josette Ade, MD  spironolactone  (ALDACTONE ) 25 MG tablet Take 37.5 mg by mouth daily. 01/20/24   [provider]    Physical Exam: Vitals:   06/02/24 1410 06/02/24 1858 06/02/24 1933 06/02/24 2154  BP: 139/60 127/68 133/68 (!) 140/59  Pulse: 64 74 79 67  Resp: 17 16 19 16   Temp: 98 F (36.7 C) 98.5 F (36.9 C) 97.9 F (36.6 C) 98.2 F (36.8 C)  TempSrc: Oral Oral Oral Oral  SpO2: 94% 91% 95% 97%  Weight:    55.5 kg  Height:       General: Not in acute distress HEENT:       Eyes: PERRL, EOMI, no jaundice       ENT: No discharge from the ears and nose, no pharynx injection,  no tonsillar enlargement.        Neck: No JVD, no bruit, no mass felt. Heme: No neck lymph node enlargement. Cardiac: S1/S2, RRR, No murmurs, No gallops or rubs. Respiratory: No rales, wheezing, rhonchi or rubs. GI: Soft, nondistended, nontender, no rebound pain, no organomegaly, BS present. GU: No hematuria Ext: No pitting leg edema bilaterally. 1+DP/PT pulse bilaterally. Musculoskeletal: has tenderness in lower and middle back Skin: No rashes.  Neuro: Alert, following command, cranial nerves II-XII grossly intact, moves all extremities. Psych: Patient is not psychotic, no suicidal or hemocidal ideation.  Labs on Admission: I have personally reviewed following labs and imaging studies  CBC: Recent Labs  Lab 06/02/24 2046  WBC 9.0  HGB 12.4  HCT 38.0  MCV 89.8  PLT 230   Basic Metabolic Panel: Recent Labs  Lab 06/02/24 2046  NA 138  K 4.9  CL 103  CO2 24  GLUCOSE 114*  BUN 19  CREATININE 0.85  CALCIUM  9.4   GFR: Estimated Creatinine Clearance: 30.9 mL/min (by C-G formula based on SCr of 0.85  mg/dL). Liver Function Tests: No results for input(s): AST, ALT, ALKPHOS, BILITOT, PROT, ALBUMIN in the last 168 hours. No results for input(s): LIPASE, AMYLASE in the last 168 hours. No results for input(s): AMMONIA in the last 168 hours. Coagulation Profile: No results for input(s): INR, PROTIME in the last 168 hours. Cardiac Enzymes: No results for input(s): CKTOTAL, CKMB, CKMBINDEX, TROPONINI in the last 168 hours. BNP (last 3 results) No results for input(s): PROBNP in the last 8760 hours. HbA1C: No results for input(s): HGBA1C in the last 72 hours. CBG: No results for input(s): GLUCAP in the last 168 hours. Lipid Profile: No results for input(s): CHOL, HDL, LDLCALC, TRIG, CHOLHDL, LDLDIRECT in the last 72 hours. Thyroid  Function Tests: No results for input(s): TSH, T4TOTAL, FREET4, T3FREE, THYROIDAB in the last 72 hours. Anemia Panel: No results for input(s): VITAMINB12, FOLATE, FERRITIN, TIBC, IRON, RETICCTPCT in the last 72 hours. Urine analysis:    Component Value Date/Time   COLORURINE STRAW (A) 01/07/2024 1512   APPEARANCEUR CLEAR (A) 01/07/2024 1512   LABSPEC 1.006 01/07/2024 1512   PHURINE 7.0 01/07/2024 1512   GLUCOSEU NEGATIVE 01/07/2024 1512   HGBUR NEGATIVE 01/07/2024 1512   BILIRUBINUR NEGATIVE 01/07/2024 1512   KETONESUR NEGATIVE 01/07/2024 1512   PROTEINUR NEGATIVE 01/07/2024 1512   NITRITE NEGATIVE 01/07/2024 1512   LEUKOCYTESUR NEGATIVE 01/07/2024 1512   Sepsis Labs: @LABRCNTIP (procalcitonin:4,lacticidven:4) )No results found for this or any previous visit (from the past 240 hours).   Radiological Exams on Admission:   Assessment/Plan Principal Problem:   Multiple falls Active Problems:   Closed cervical spine fracture (HCC)   Closed compression fracture of L4 vertebra (HCC)   Compression fracture of T3 vertebra (HCC)   HTN (hypertension)   History of DVT (deep vein  thrombosis)   Mixed Alzheimer's and vascular dementia (HCC)   Assessment and Plan:   Multiple falls, closed cervical spine fracture (C1 and C2), closed compression fracture of L4 vertebra and compression fracture of T3 vertebra: Patient is not a good candidate for surgery.  ED physician discussed with Dr. Clois of neurosurgery, not recommend surgical intervention.  -will place in med-surg bed for observation - Fall precaution - PT/OT -Continue c-collar - As needed fentanyl , Percocet, Tylenol  for pain - Lidoderm  patch - As needed Robaxin  - Senna daily as needed for constipation  HTN (hypertension) -IV hydralazine  as needed - Amlodipine , spironolactone , metoprolol     Hx of History  of DVT (deep vein thrombosis): -per her daughter, Eliquis  was discontinued by hospice recently  Mixed Alzheimer's and vascular dementia [facility]): No behavior disturbance. - Fall precaution    DVT ppx: SQ Lovenox    Code Status: DNR per her daughter  Family Communication:   Yes, patient's daughter at bed side.      Disposition Plan:  Anticipate discharge back to previous environment, SNF  Consults called: ED physician discussed with Dr. Clois of neurosurgery  Admission status and Level of care: Med-Surg:    for obs    Dispo: The patient is from: [facility]               Anticipated d/c is to: SNF              Anticipated d/c date is: 1 day              Patient currently is not medically stable to d/c.    Severity of Illness:  The appropriate patient status for this patient is OBSERVATION. Observation status is judged to be reasonable and necessary in order to provide the required intensity of service to ensure the patient's safety. The patient's presenting symptoms, physical exam findings, and initial radiographic and laboratory data in the context of their medical condition is felt to place them at decreased risk for further clinical deterioration. Furthermore, it is  anticipated that the patient will be medically stable for discharge from the hospital within 2 midnights of admission.        Date of Service 06/02/2024    Caleb Exon [facility]   If 7PM-7AM, please contact night-coverage [email or web address] 06/02/2024, 10:34 PM     [1]  Allergies Allergen Reactions   Codeine Nausea Only   Ditropan [Oxybutynin]    Hydrocodone Nausea Only   Morphine  Nausea Only   Oxybutynin Chloride Other (See Comments)   Latex Rash   "

## 2024-06-02 NOTE — ED Provider Notes (Addendum)
 Anne Oneill Provider Note    Event Date/Time   First MD Initiated Contact with Patient 06/02/24 1729     (approximate)   History   Fall   HPI  Anne Oneill is a 89 y.o. female with history of dementia, history of DVT on Eliquis , on hospice presenting after a fall.  Patient did say that she hit her head during the fall.  Is complaining of right hip pain.  No new weakness or numbness or tingling.  Per independent history from daughter who is her power of attorney, patient is in memory care at this time, facility had noted that she had an unwitnessed fall, was not weightbearing on her right leg, sent her in for further evaluation.  States that facility is unable to take her back if she is unable to ambulate by herself.  States that she is comfort measures, no surgical intervention to her spine.  Daughter states that she is incontinent at baseline.  Typically able to ambulate.  Independent history from EMS, she had an unwitnessed fall at back to memory care unit.  Has had multiple falls recently.  Is on hospice.  Was complaining about right hip and lower back pain.  Is supposed to be wearing a c-collar from previous C1-C2 fractures but staff had reported that she was not wearing a collar when she fell.  On independent chart review, she was admitted in September after fall, was found to have C1 and C2 fractures, she was evaluated by neurosurgery given her advanced age, she was not recommended for surgical intervention.  Was recommended to continue her collar when out of bed.  And that she should remove her collar to eat and when asleep.  During the admission, she also had a left hip hemiarthroplasty for displaced femoral neck fracture.  She was also seen in October by neurosurgery, had repeat x-rays done that showed significant movement at the C1-C2 on flex ex views.  Was told to continue using a collar when up and out of bed.  She was also admitted to hospice in  October.     Physical Exam   Triage Vital Signs: ED Triage Vitals  Encounter Vitals Group     BP 06/02/24 1410 139/60     Girls Systolic BP Percentile --      Girls Diastolic BP Percentile --      Boys Systolic BP Percentile --      Boys Diastolic BP Percentile --      Pulse Rate 06/02/24 1410 64     Resp 06/02/24 1410 17     Temp 06/02/24 1410 98 F (36.7 C)     Temp Source 06/02/24 1410 Oral     SpO2 06/02/24 1410 94 %     Weight 06/02/24 1409 121 lb 4.1 oz (55 kg)     Height 06/02/24 1409 5' (1.524 m)     Head Circumference --      Peak Flow --      Pain Score 06/02/24 1407 8     Pain Loc --      Pain Education --      Exclude from Growth Chart --     Most recent vital signs: Vitals:   06/02/24 1858 06/02/24 1933  BP: 127/68 133/68  Pulse: 74 79  Resp: 16 19  Temp: 98.5 F (36.9 C) 97.9 F (36.6 C)  SpO2: 91% 95%     General: Awake, no distress.  CV:  Good peripheral  perfusion.  Resp:  Normal effort.  No thoracic cage tenderness Abd:  No distention.  Soft nontender Other:  Patient is in c-collar, no thoracic tenderness, she does have lower lumbar tenderness as well as right hip tenderness with reduced range of motion.  No tenderness to her bilateral upper extremities, lower extremities.  Rest of her right lower extremity.  She is able to dorsi and plantarflex bilaterally equally, grip strength intact bilaterally.  Sensation is intact.  No saddle anesthesia.  No other palpable skull deformities.   ED Results / Procedures / Treatments   Labs (all labs ordered are listed, but only abnormal results are displayed) Labs Reviewed  CBC  BASIC METABOLIC PANEL WITH GFR  BASIC METABOLIC PANEL WITH GFR  CBC       RADIOLOGY On my independent interpretation, CT head without obvious intracranial hemorrhage   PROCEDURES:  Critical Care performed: No  Procedures   MEDICATIONS ORDERED IN ED: Medications  fentaNYL  (SUBLIMAZE ) injection 12.5 mcg (has no  administration in time range)  oxyCODONE -acetaminophen  (PERCOCET/ROXICET) 5-325 MG per tablet 1 tablet (has no administration in time range)  methocarbamol  (ROBAXIN ) tablet 500 mg (has no administration in time range)  lidocaine  (LIDODERM ) 5 % 1 patch (has no administration in time range)  ondansetron  (ZOFRAN ) injection 4 mg (has no administration in time range)  hydrALAZINE  (APRESOLINE ) injection 5 mg (has no administration in time range)  acetaminophen  (TYLENOL ) tablet 650 mg (has no administration in time range)  HYDROmorphone  (DILAUDID ) injection 0.5 mg (0.5 mg Intravenous Given 06/02/24 1842)  ondansetron  (ZOFRAN ) injection 4 mg (4 mg Intravenous Given 06/02/24 1842)     IMPRESSION / MDM / ASSESSMENT AND PLAN / ED COURSE  I reviewed the triage vital signs and the nursing notes.                              Differential diagnosis includes, but is not limited to, fracture, contusion, strain, sprain.  Intracranial hemorrhage.  CT head and neck as well as x-ray hip was obtained out of triage.  Will add on CT lumbar spine as well as pelvis.  IV pain meds.  Likely needs admission.  Patient's presentation is most consistent with acute presentation with potential threat to life or bodily function.  Independent interpretation of imaging below.  Given patient's pain and inability to ambulate as per usual and inability to return to memory care unit, she will need to be admitted for pain control and placement.  Daughter is agreeable with this plan.  Consulted hospitalist who will admit the patient.  She is admitted.  Did call hospice, patient's admission will not knock her out the program, they said that they can help with pain control, but that based on her presentation and injuries, she will likely need long-term placement in SNF.    Clinical Course as of 06/02/24 2112  Tue Jun 02, 2024  1730 DG Hip Unilat W or Wo Pelvis 2-3 Views Right IMPRESSION: 1. No acute fracture or dislocation. 2.  Mildly displaced fractures of the right pubic bone with improved alignment compared to prior radiograph.   [TT]  1730 CT Head Wo Contrast IMPRESSION: 1. No CT evidence for acute intracranial abnormality. Atrophy and chronic small vessel ischemic changes of the white matter. 2. Known fractures of the anterior and posterior arch of C1, some interval osseous bridging across the anterior arch fracture. Slight interval increase in displacement of left posterior arch fracture, now measuring  2 mm, previously nondisplaced. Similar mild lateral displacement of the left lateral mass. 3. Known type 3 odontoid fracture, with interval sclerotic margins (ununited), slight increased posterior displacement of the odontoid compared to prior measuring 3 mm, and increased fracture distraction measuring up to 5 mm 4. Chronic compression deformity of T3. No new fracture is seen.   [TT]  [id number] Patient desatted slightly with the IV pain meds.On nasal cannula, has now transitioned off of it, satting 94% on room air. [TT]  1958 CT PELVIS WO CONTRAST IMPRESSION: 1. No acute fracture or dislocation of the pelvis. 2. Acute appearing compression fracture of superior endplate of L4. Correlation with clinical exam and point tenderness recommended. 3. Sigmoid diverticulosis. 4.  Aortic Atherosclerosis (ICD10-I70.0).   [TT]  2003 CT Lumbar Spine Wo Contrast IMPRESSION: 1. Acute or subacute L4 fracture with 15% height loss and 3mm bony retropulsion. Mild to moderate resulting canal stenosis.   [TT]  2006 Consulted surgery, no surgical intervention.  Will plan to admit for pain control and placement. [TT]  2008 Updated her daughter about imaging results.  She is comfortable plan for admission and placement. [TT]    Clinical Course User Index [TT] Waymond Lorelle Cummins, MD     FINAL CLINICAL IMPRESSION(S) / ED DIAGNOSES   Final diagnoses:  Fall, initial encounter  Acute midline low back pain, unspecified whether  sciatica present  Pain of right hip  Closed fracture of fourth lumbar vertebra, unspecified fracture morphology, initial encounter (HCC)  Closed fracture of cervical vertebra, unspecified cervical vertebral level, initial encounter (HCC)     Rx / DC Orders   ED Discharge Orders     None        Note:  This document was prepared using Dragon voice recognition software and may include unintentional dictation errors.    Waymond Lorelle Cummins, MD 06/02/24 2035    Waymond Lorelle Cummins, MD 06/02/24 2112

## 2024-06-03 DIAGNOSIS — S12090A Other displaced fracture of first cervical vertebra, initial encounter for closed fracture: Secondary | ICD-10-CM | POA: Diagnosis present

## 2024-06-03 DIAGNOSIS — Z8249 Family history of ischemic heart disease and other diseases of the circulatory system: Secondary | ICD-10-CM | POA: Diagnosis not present

## 2024-06-03 DIAGNOSIS — F0280 Dementia in other diseases classified elsewhere without behavioral disturbance: Secondary | ICD-10-CM

## 2024-06-03 DIAGNOSIS — S12030A Displaced posterior arch fracture of first cervical vertebra, initial encounter for closed fracture: Secondary | ICD-10-CM | POA: Diagnosis present

## 2024-06-03 DIAGNOSIS — Z7901 Long term (current) use of anticoagulants: Secondary | ICD-10-CM | POA: Diagnosis not present

## 2024-06-03 DIAGNOSIS — M4856XA Collapsed vertebra, not elsewhere classified, lumbar region, initial encounter for fracture: Secondary | ICD-10-CM | POA: Diagnosis present

## 2024-06-03 DIAGNOSIS — I10 Essential (primary) hypertension: Secondary | ICD-10-CM | POA: Diagnosis present

## 2024-06-03 DIAGNOSIS — W19XXXA Unspecified fall, initial encounter: Secondary | ICD-10-CM | POA: Diagnosis present

## 2024-06-03 DIAGNOSIS — Z885 Allergy status to narcotic agent status: Secondary | ICD-10-CM | POA: Diagnosis not present

## 2024-06-03 DIAGNOSIS — G309 Alzheimer's disease, unspecified: Secondary | ICD-10-CM

## 2024-06-03 DIAGNOSIS — Z888 Allergy status to other drugs, medicaments and biological substances status: Secondary | ICD-10-CM | POA: Diagnosis not present

## 2024-06-03 DIAGNOSIS — Z79899 Other long term (current) drug therapy: Secondary | ICD-10-CM | POA: Diagnosis not present

## 2024-06-03 DIAGNOSIS — Z515 Encounter for palliative care: Secondary | ICD-10-CM | POA: Diagnosis not present

## 2024-06-03 DIAGNOSIS — F0150 Vascular dementia without behavioral disturbance: Secondary | ICD-10-CM | POA: Diagnosis present

## 2024-06-03 DIAGNOSIS — S32501A Unspecified fracture of right pubis, initial encounter for closed fracture: Secondary | ICD-10-CM | POA: Diagnosis present

## 2024-06-03 DIAGNOSIS — R296 Repeated falls: Secondary | ICD-10-CM | POA: Diagnosis present

## 2024-06-03 DIAGNOSIS — S32040G Wedge compression fracture of fourth lumbar vertebra, subsequent encounter for fracture with delayed healing: Secondary | ICD-10-CM

## 2024-06-03 DIAGNOSIS — E7800 Pure hypercholesterolemia, unspecified: Secondary | ICD-10-CM | POA: Diagnosis present

## 2024-06-03 DIAGNOSIS — Z86718 Personal history of other venous thrombosis and embolism: Secondary | ICD-10-CM | POA: Diagnosis not present

## 2024-06-03 DIAGNOSIS — Z91040 Latex allergy status: Secondary | ICD-10-CM | POA: Diagnosis not present

## 2024-06-03 DIAGNOSIS — Z66 Do not resuscitate: Secondary | ICD-10-CM | POA: Diagnosis present

## 2024-06-03 DIAGNOSIS — Z96642 Presence of left artificial hip joint: Secondary | ICD-10-CM | POA: Diagnosis present

## 2024-06-03 DIAGNOSIS — Z85828 Personal history of other malignant neoplasm of skin: Secondary | ICD-10-CM | POA: Diagnosis not present

## 2024-06-03 DIAGNOSIS — Z9011 Acquired absence of right breast and nipple: Secondary | ICD-10-CM | POA: Diagnosis not present

## 2024-06-03 DIAGNOSIS — S12101G Unspecified nondisplaced fracture of second cervical vertebra, subsequent encounter for fracture with delayed healing: Secondary | ICD-10-CM

## 2024-06-03 DIAGNOSIS — Z90710 Acquired absence of both cervix and uterus: Secondary | ICD-10-CM | POA: Diagnosis not present

## 2024-06-03 DIAGNOSIS — I700 Atherosclerosis of aorta: Secondary | ICD-10-CM | POA: Diagnosis present

## 2024-06-03 MED ORDER — ESCITALOPRAM OXALATE 10 MG PO TABS
10.0000 mg | ORAL_TABLET | Freq: Every day | ORAL | Status: DC
  Administered 2024-06-03 – 2024-06-12 (×10): 10 mg via ORAL
  Filled 2024-06-03 (×5): qty 1

## 2024-06-03 MED ORDER — ENOXAPARIN SODIUM 40 MG/0.4ML IJ SOSY
40.0000 mg | PREFILLED_SYRINGE | INTRAMUSCULAR | Status: DC
  Administered 2024-06-03 – 2024-06-12 (×10): 40 mg via SUBCUTANEOUS
  Filled 2024-06-03 (×5): qty 0.4

## 2024-06-03 MED ORDER — AMLODIPINE BESYLATE 10 MG PO TABS
10.0000 mg | ORAL_TABLET | Freq: Every day | ORAL | Status: DC
  Administered 2024-06-03 – 2024-06-12 (×10): 10 mg via ORAL
  Filled 2024-06-03 (×5): qty 1

## 2024-06-03 MED ORDER — METOPROLOL SUCCINATE ER 50 MG PO TB24
50.0000 mg | ORAL_TABLET | Freq: Every day | ORAL | Status: DC
  Administered 2024-06-03 – 2024-06-12 (×10): 50 mg via ORAL
  Filled 2024-06-03 (×5): qty 1

## 2024-06-03 MED ORDER — ORAL CARE MOUTH RINSE: 15.0000 mL | OROMUCOSAL | Status: DC | PRN

## 2024-06-03 MED ORDER — SPIRONOLACTONE 25 MG PO TABS
37.5000 mg | ORAL_TABLET | Freq: Every day | ORAL | Status: DC
  Administered 2024-06-03 – 2024-06-12 (×10): 37.5 mg via ORAL
  Filled 2024-06-03 (×5): qty 1

## 2024-06-03 MED ORDER — POLYETHYLENE GLYCOL 3350 17 G PO PACK
17.0000 g | PACK | Freq: Every day | ORAL | Status: DC | PRN
  Administered 2024-06-04: 17 g via ORAL
  Filled 2024-06-03: qty 1

## 2024-06-03 NOTE — Evaluation (Signed)
 Physical Therapy Evaluation Patient Details Name: Anne Oneill MRN: [id number] DOB: 04-29-1927 Today's Date: 06/03/2024  History of Present Illness  89 y/o female presented to ED on 06/02/24 for multiple falls. Sustained L4 and T3 compression fractures. Treating non-operatively. PMH: dementia, known C1-2 fx on C-collar, HTN, DVT, TIA, meningioma, breast cancer  Clinical Impression  Patient admitted with the above. PTA, patient lives at [facility] and recently has been ambulatory to/from bathroom with RW per chart. Patient with frequent falls. Oriented to self, place, and somewhat of situation. Required totalA+2 for log roll to EOB as patient participating in <10% of movement. Required CGA-modA for sitting balance with frequent cues to place hands on bed to steady self. Limited by pain and fear of falling. Patient will benefit from skilled PT services during acute stay to address listed deficits. Patient will benefit from ongoing therapy at discharge to maximize functional independence and safety.         If plan is discharge home, recommend the following: Two people to help with walking and/or transfers;Two people to help with bathing/dressing/bathroom;Direct supervision/assist for medications management;Direct supervision/assist for financial management;Supervision due to cognitive status   Can travel by private vehicle   No    Equipment Recommendations Other (comment) (TBD)  Recommendations for Other Services       Functional Status Assessment Patient has had a recent decline in their functional status and/or demonstrates limited ability to make significant improvements in function in a reasonable and predictable amount of time     Precautions / Restrictions Precautions Precautions: Fall Recall of Precautions/Restrictions: Impaired Restrictions Weight Bearing Restrictions Per Provider Order: No      Mobility  Bed Mobility Overal bed mobility: Needs Assistance Bed  Mobility: Rolling, Sidelying to Sit, Sit to Sidelying Rolling: Total assist, +2 for physical assistance, +2 for safety/equipment Sidelying to sit: Total assist, +2 for physical assistance, +2 for safety/equipment     Sit to sidelying: Total assist, +2 for physical assistance, +2 for safety/equipment General bed mobility comments: patient assisting <10% of mobility. Patient fearful of falling    Transfers                        Ambulation/Gait                  Stairs            Wheelchair Mobility     Tilt Bed    Modified Rankin (Stroke Patients Only)       Balance Overall balance assessment: Needs assistance Sitting-balance support: Bilateral upper extremity supported, Feet supported Sitting balance-Leahy Scale: Poor Sitting balance - Comments: up to modA but able to progress to CGA for brief periods                                     Pertinent Vitals/Pain Pain Assessment Pain Assessment: Faces Faces Pain Scale: Hurts even more Pain Location: generalized Pain Descriptors / Indicators: Grimacing, Guarding Pain Intervention(s): Limited activity within patient's tolerance, Monitored during session, Repositioned    Home Living Family/patient expects to be discharged to:: Skilled nursing facility ([facility])                   Additional Comments: [facility]    Prior Function Prior Level of Function : Needs assist  Mobility Comments: Ambulates with RW to bathroom only, Lives in memory care at [place] ADLs Comments: Requires assistance with ADLs     Extremity/Trunk Assessment   Upper Extremity Assessment Upper Extremity Assessment: Defer to OT evaluation    Lower Extremity Assessment Lower Extremity Assessment: Generalized weakness    Cervical / Trunk Assessment Cervical / Trunk Assessment: Other exceptions Cervical / Trunk Exceptions: C1-2 fx with C-collar, L4  compression fx  Communication   Communication Communication: No apparent difficulties    Cognition Arousal: Alert Behavior During Therapy: Flat affect   PT - Cognitive impairments: History of cognitive impairments                       PT - Cognition Comments: oriented to self, place, and somewhat of situation Following commands: Impaired Following commands impaired: Follows one step commands with increased time     Cueing       General Comments      Exercises     Assessment/Plan    PT Assessment Patient needs continued PT services  PT Problem List Decreased strength;Decreased balance;Decreased activity tolerance;Decreased mobility;Decreased cognition;Decreased knowledge of use of DME;Decreased safety awareness;Decreased knowledge of precautions;Cardiopulmonary status limiting activity;Pain       PT Treatment Interventions Gait training;DME instruction;Functional mobility training;Therapeutic activities;Therapeutic exercise;Balance training;Neuromuscular re-education;Patient/family education    PT Goals (Current goals can be found in the Care Plan section)  Acute Rehab PT Goals Patient Stated Goal: did not state PT Goal Formulation: Patient unable to participate in goal setting Time For Goal Achievement: 06/17/24 Potential to Achieve Goals: Fair    Frequency Min 1X/week     Co-evaluation               AM-PAC PT 6 Clicks Mobility  Outcome Measure Help needed turning from your back to your side while in a flat bed without using bedrails?: Total Help needed moving from lying on your back to sitting on the side of a flat bed without using bedrails?: Total Help needed moving to and from a bed to a chair (including a wheelchair)?: Total Help needed standing up from a chair using your arms (e.g., wheelchair or bedside chair)?: Total Help needed to walk in hospital room?: Total Help needed climbing 3-5 steps with a railing? : Total 6 Click Score: 6     End of Session   Activity Tolerance: Patient limited by pain Patient left: in bed;with call bell/phone within reach;with bed alarm set Nurse Communication: Mobility status PT Visit Diagnosis: Repeated falls (R29.6);Muscle weakness (generalized) (M62.81);Other abnormalities of gait and mobility (R26.89);Unsteadiness on feet (R26.81);History of falling (Z91.81);Difficulty in walking, not elsewhere classified (R26.2)    Time: 9183-9171 PT Time Calculation (min) (ACUTE ONLY): 12 min   Charges:   PT Evaluation $PT Eval Moderate Complexity: 1 Mod   PT General Charges $$ ACUTE PT VISIT: 1 Visit         Maryanne Finder, PT, DPT Physical Therapist - [facility]  [facility]   Burnice Oestreicher A Aunesty Tyson 06/03/2024, 9:03 AM

## 2024-06-03 NOTE — Progress Notes (Signed)
 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 She was transported to ED after a fall and found to have new compression fracture. She was admitted to the hospital with diagnosis of Multiple Falls and per Dr. Norleen Laurence with [facility] this is a related hospital admission. Patient is a DNR. Exchanged report with MD and case production designer, theatre/television/film. Per daughter's report patient will not be able to return to [facility] due to needing higher level of care. At this point daughter is unsure whether to pursue rehab as suggested by hospital therapist or to go straight to pursuing LTC.   She is inpatient appropriate with need for diagnostic testing related to new fall.   Vital Signs- 98.2/67/18    127/69   spO2 99% 1L nasal cannula I&O- not recorded/350 Abnormal Labs- N/A Diagnostics-   CT HEAD WITHOUT CONTRAST CT CERVICAL SPINE WITHOUT CONTRAST IMPRESSION: 1. No CT evidence for acute intracranial abnormality. Atrophy and chronic small vessel ischemic changes of the white matter. 2. Known fractures of the anterior and posterior arch of C1, some interval osseous bridging across the anterior arch fracture. Slight interval increase in displacement of left posterior arch fracture, now measuring 2 mm, previously nondisplaced. Similar mild lateral displacement of the left lateral mass. 3. Known type 3 odontoid fracture, with interval sclerotic margins (ununited), slight increased posterior displacement of the odontoid compared to prior measuring 3 mm, and increased fracture distraction measuring up to 5 mm 4. Chronic compression deformity of T3. No new fracture is seen.  Electronically Signed   By: Luke Bun M.D.   On: 06/02/2024 15:35 IV/PRN Meds- Dilaudid  0.5mg  IV once, Zofran  5mg  IV once, Oxycodone  5/325 1 PO once,  Current plan  as per PN Dr. Murvin Mana 12.24.25  Frequent falls. Worsening C1 compression fracture with old fracture. Acute on chronic T4 compression fracture, Chronic compression fracture of T3. Acute compression fracture of L4. Mild displaced right pubic bone fracture. Patient had multiple fractures from the fall, she had a frequent falls in the past due to advanced age and dementia. Discussed with patient daughter, family does not want any intervention.  Therefore, no need for neurosurgical consult. Will treat symptomatically.  PT/OT recommended nursing home placement. Patient has been placed on neck collar, but she has poor compliance.   Discharge Planning- Ongoing, Rehab vs LTC with hospice Family Contact- Talked with [name] IDT: updated Goals of Care: DNR Transfer summary and Med list sent to be placed under Media  Thank you for the opportunity to participate in this patient's care, please don't hesitate to call for any hospice related questions or concerns.  Hunter Seip BSN, [facility] Liaison [phone]

## 2024-06-03 NOTE — Care Management Obs Status (Signed)
 MEDICARE OBSERVATION STATUS NOTIFICATION   Patient Details  Name: SHARMAIN LASTRA MRN: [id number] Date of Birth: [birth year]   Medicare Observation Status Notification Given:  Chaney BRANDY CHRISTIANE LELON, CMA 06/03/2024, 12:16 PM

## 2024-06-03 NOTE — Progress Notes (Signed)
 [facility] Room 106 [facility] Liaison note  This patient is currently followed by [facility].  [facility] will follow through discharge disposition.  Please call with any hospice related questions or concerns.  [facility] Liaison [phone]

## 2024-06-03 NOTE — Evaluation (Addendum)
 Occupational Therapy Evaluation Patient Details Name: Anne Oneill MRN: [id number] DOB: [birth year] Today's Date: 06/03/2024   History of Present Illness   89 y/o female presented to ED on 06/02/24 for multiple falls. Sustained L4 and T3 compression fractures. Treating non-operatively. PMH: dementia, known C1-2 fx on C-collar, HTN, DVT, TIA, meningioma, breast cancer     Clinical Impressions Pt was seen for OT evaluation this date. PTA, pt is from memory care on hospice where she has assist with all ADLs and walks with a walker with assist/supervision. Pt presents with deficits in strength, balance, cognition and pain limiting their ability to perform ADL management at baseline level. Pt currently requires total assist +2 for all bed mobility via log roll technique with collar donned from previous c fxs. Pt requiring Mod A progressing to brief periods of CGA for seated balance at EOB during session, but fatigues easily and requires frequent cueing to utilize BUEs for support on bed. Pt noted with fear of falling and minimally resistive to movements initially. Total A for LB dressing, anticipate Max A to TOTAL A for most ADLs. Will follow acutely to promote return to PLOF and recommend follow up therapy as indicated vs LTC for comfort d/t extensive fractures.     If plan is discharge home, recommend the following:   A lot of help with walking and/or transfers;A lot of help with bathing/dressing/bathroom     Functional Status Assessment   Patient has had a recent decline in their functional status and demonstrates the ability to make significant improvements in function in a reasonable and predictable amount of time.     Equipment Recommendations   Other (comment) (defer)     Recommendations for Other Services         Precautions/Restrictions   Precautions Precautions: Fall Recall of Precautions/Restrictions: Impaired Restrictions Weight Bearing Restrictions Per  Provider Order: No     Mobility Bed Mobility Overal bed mobility: Needs Assistance Bed Mobility: Rolling, Sidelying to Sit, Sit to Sidelying Rolling: Total assist, +2 for physical assistance, +2 for safety/equipment Sidelying to sit: Total assist, +2 for physical assistance, +2 for safety/equipment     Sit to sidelying: Total assist, +2 for physical assistance, +2 for safety/equipment General bed mobility comments: noted with fear of falling, resistive to movements, +2 assist to perform all bed mobility via log roll and Min/CGA for seated balance at EOB    Transfers                   General transfer comment: deferred      Balance Overall balance assessment: Needs assistance Sitting-balance support: Bilateral upper extremity supported, Feet supported Sitting balance-Leahy Scale: Poor Sitting balance - Comments: Mod A progressing to CGA for brief periods seated EOB                                   ADL either performed or assessed with clinical judgement   ADL Overall ADL's : Needs assistance/impaired                 Upper Body Dressing : Moderate assistance;Sitting Upper Body Dressing Details (indicate cue type and reason): to adjust and manage gown Lower Body Dressing: Total assistance;Bed level Lower Body Dressing Details (indicate cue type and reason): donn bil socks                     Vision  Perception         Praxis         Pertinent Vitals/Pain Pain Assessment Pain Assessment: Faces Faces Pain Scale: Hurts even more Pain Location: generalized Pain Descriptors / Indicators: Grimacing, Guarding Pain Intervention(s): Monitored during session, Repositioned, Limited activity within patient's tolerance     Extremity/Trunk Assessment Upper Extremity Assessment Upper Extremity Assessment: Generalized weakness   Lower Extremity Assessment Lower Extremity Assessment: Generalized weakness;Defer to PT evaluation    Cervical / Trunk Assessment Cervical / Trunk Assessment: Other exceptions Cervical / Trunk Exceptions: C1-2 fx with C-collar, L4 compression fx   Communication Communication Communication: No apparent difficulties   Cognition Arousal: Alert Behavior During Therapy: Flat affect               OT - Cognition Comments: oriented to place and month                 Following commands: Impaired Following commands impaired: Follows one step commands with increased time     Cueing  General Comments      VSS on RA   Exercises     Shoulder Instructions      Home Living Family/patient expects to be discharged to:: Skilled nursing facility ([facility] [place])                                 Additional Comments: [facility]      Prior Functioning/Environment Prior Level of Function : Needs assist             Mobility Comments: Ambulates with RW to bathroom only, Lives in memory care at [place] ADLs Comments: Requires assistance with ADLs    OT Problem List: Decreased strength;Impaired balance (sitting and/or standing);Pain   OT Treatment/Interventions: Self-care/ADL training;Patient/family education;Therapeutic exercise;Balance training;Therapeutic activities;DME and/or AE instruction      OT Goals(Current goals can be found in the care plan section)   Acute Rehab OT Goals OT Goal Formulation: Patient unable to participate in goal setting Time For Goal Achievement: 06/17/24 Potential to Achieve Goals: Fair ADL Goals Pt Will Perform Grooming: sitting;with supervision Pt Will Transfer to Toilet: with min assist;ambulating;stand pivot transfer;bedside commode   OT Frequency:  Min 2X/week    Co-evaluation PT/OT/SLP Co-Evaluation/Treatment: Yes Reason for Co-Treatment: For patient/therapist safety          AM-PAC OT 6 Clicks Daily Activity     Outcome Measure Help from another person eating meals?: A Little Help  from another person taking care of personal grooming?: A Lot Help from another person toileting, which includes using toliet, bedpan, or urinal?: Total Help from another person bathing (including washing, rinsing, drying)?: Total Help from another person to put on and taking off regular upper body clothing?: A Lot Help from another person to put on and taking off regular lower body clothing?: Total 6 Click Score: 10   End of Session Nurse Communication: Mobility status  Activity Tolerance: Patient tolerated treatment well;Patient limited by pain Patient left: in bed;with call bell/phone within reach;with bed alarm set  OT Visit Diagnosis: Other abnormalities of gait and mobility (R26.89);Muscle weakness (generalized) (M62.81);Pain;Repeated falls (R29.6)                Time: 9185-9172 OT Time Calculation (min): 13 min Charges:  OT General Charges $OT Visit: 1 Visit OT Evaluation $OT Eval Moderate Complexity: 1 Mod Lazarius Rivkin Chrismon, OTR/L  06/03/2024, 10:24 AM  Duwaine  E Chrismon 06/03/2024, 10:20 AM

## 2024-06-03 NOTE — Plan of Care (Signed)
"                                                                                                                                            °                                                   °  Palliative Care Progress Note   Patient Name: Anne Oneill       Date: 06/03/2024 DOB: [birth year]  Age: 89 y.o. MRN#: [id number] Attending Physician: Laurita Pillion, MD Primary Care Physician: [facility] Admit Date: 06/02/2024  Extensive chart review completed including labs, vital signs, imaging, progress notes, orders, and available advanced directive documents from current and previous encounters.   Pt is currently enrolled in [facility]) services. I contacted [facility] hospice liaison Marinell Nova and he confirmed that [facility] will assist patient with symptom management and goals of care discussions during this hospitalization.   No need for PMT to see at this time. PMT will monitor the patient peripherally and intervene where appropriate/when engaged by [facility].   Thank you for allowing the Palliative Medicine Team to assist in the care of Anne Oneill.  Lamarr L. Arvid, DNP, FNP-BC Palliative Medicine Team    No charge "

## 2024-06-03 NOTE — Hospital Course (Signed)
 Anne Oneill is a 89 y.o. female with medical history significant of dementia, fall, known C1-C2 fx on C-collar, HTN, HLD,TIA, DVT (off Eliquis  per her daughter), thrombocytopenia, meningioma, breast cancer, under hospice care, who presents with multiple falls.  CT scan showed known C1 fracture, but slight interval increase in displacement.  Chronic compression fracture in T3, acute on chronic T4 compression fracture, acute compression fracture of L4.  Mild displaced fracture of the right pubic bone.

## 2024-06-03 NOTE — Progress Notes (Signed)
" °  Progress Note   Patient: Anne Oneill FMW:[id number] DOB: [birth year] DOA: 06/02/2024     0 DOS: the patient was seen and examined on 06/03/2024   Brief hospital course: AISIA CORREIRA is a 89 y.o. female with medical history significant of dementia, fall, known C1-C2 fx on C-collar, HTN, HLD,TIA, DVT (off Eliquis  per her daughter), thrombocytopenia, meningioma, breast cancer, under hospice care, who presents with multiple falls.  CT scan showed known C1 fracture, but slight interval increase in displacement.  Chronic compression fracture in T3, acute on chronic T4 compression fracture, acute compression fracture of L4.  Mild displaced fracture of the right pubic bone.     Principal Problem:   Multiple falls Active Problems:   Closed cervical spine fracture (HCC)   Closed compression fracture of L4 vertebra (HCC)   Compression fracture of T3 vertebra (HCC)   HTN (hypertension)   History of DVT (deep vein thrombosis)   Mixed Alzheimer's and vascular dementia (HCC)   Assessment and Plan: Frequent falls. Worsening C1 compression fracture with old fracture. Acute on chronic T4 compression fracture, Chronic compression fracture of T3. Acute compression fracture of L4. Mild displaced right pubic bone fracture. Patient had multiple fractures from the fall, she had a frequent falls in the past due to advanced age and dementia. Discussed with patient daughter, family does not want any intervention.  Therefore, no need for neurosurgical consult. Will treat symptomatically.  PT/OT recommended nursing home placement. Patient has been placed on neck collar, but she has poor compliance.  Mixed Alzheimer's and vascular dementia. Patient has acquired advanced dementia, will continue to monitor for delirium.  Essential hypertension. Continue some home medicines.  History of DVT. Patient not currently on anticoagulation.  Goal of care discussion. I had a long discussion with patient  daughter, patient long-term prognosis is very poor with her advanced dementia and advanced age.  Currently she has multiple fractures, most serious is a worsening C1 compression fracture with dislocation.  Patient has not been able to compliant with the neck collar.  She is not a candidate for surgery.  Her condition can deteriorate quickly.  She is still hospice appropriate.    Subjective:  Patient does not complain of significant pain.  Physical Exam: Vitals:   06/02/24 1933 06/02/24 2154 06/03/24 0551 06/03/24 0900  BP: 133/68 (!) 140/59 (!) 127/56 127/69  Pulse: 79 67 72 67  Resp: 19 16 19 18   Temp: 97.9 F (36.6 C) 98.2 F (36.8 C) 97.9 F (36.6 C) 98.2 F (36.8 C)  TempSrc: Oral Oral  Oral  SpO2: 95% 97% 99% 99%  Weight:  55.5 kg    Height:       General exam: Appears calm and comfortable  Respiratory system: Clear to auscultation. Respiratory effort normal. Cardiovascular system: S1 & S2 heard, RRR. No JVD, murmurs, rubs, gallops or clicks. No pedal edema. Gastrointestinal system: Abdomen is nondistended, soft and nontender. No organomegaly or masses felt. Normal bowel sounds heard. Central nervous system: Alert and oriented x1. No focal neurological deficits. Extremities: Symmetric 5 x 5 power. Skin: No rashes, lesions or ulcers Psychiatry: Mood & affect appropriate.    Data Reviewed:  Reviewed CT scan results and lab results.  Family Communication: Daughter updated at bedside  Disposition: Status is: Observation      Time spent: 50 minutes  Author: Murvin Mana, MD 06/03/2024 12:33 PM  For on call review [email or web address]    "

## 2024-06-04 DIAGNOSIS — R296 Repeated falls: Secondary | ICD-10-CM | POA: Diagnosis not present

## 2024-06-04 DIAGNOSIS — S32040G Wedge compression fracture of fourth lumbar vertebra, subsequent encounter for fracture with delayed healing: Secondary | ICD-10-CM | POA: Diagnosis not present

## 2024-06-04 DIAGNOSIS — S12101G Unspecified nondisplaced fracture of second cervical vertebra, subsequent encounter for fracture with delayed healing: Secondary | ICD-10-CM | POA: Diagnosis not present

## 2024-06-04 NOTE — Plan of Care (Signed)

## 2024-06-04 NOTE — Progress Notes (Addendum)
" °  Progress Note   Patient: Anne Oneill FMW:[id number] DOB: 01-Nov-1926 DOA: 06/02/2024     1 DOS: the patient was seen and examined on 06/04/2024   Brief hospital course: Anne Oneill is a 89 y.o. female with medical history significant of dementia, fall, known C1-C2 fx on C-collar, HTN, HLD,TIA, DVT (off Eliquis  per her daughter), thrombocytopenia, meningioma, breast cancer, under hospice care, who presents with multiple falls.  CT scan showed known C1 fracture, but slight interval increase in displacement.  Chronic compression fracture in T3, acute on chronic T4 compression fracture, acute compression fracture of L4.  Mild displaced fracture of the right pubic bone.     Principal Problem:   Multiple falls Active Problems:   Closed cervical spine fracture (HCC)   Closed compression fracture of L4 vertebra (HCC)   Compression fracture of T3 vertebra (HCC)   HTN (hypertension)   History of DVT (deep vein thrombosis)   Mixed Alzheimer's and vascular dementia (HCC)   Assessment and Plan: Frequent falls. Worsening C1 compression fracture with old fracture. Acute on chronic T4 compression fracture, Chronic compression fracture of T3. Acute compression fracture of L4. Mild displaced right pubic bone fracture. Patient had multiple fractures from the fall, she had a frequent falls in the past due to advanced age and dementia. Discussed with patient daughter, family does not want any intervention.  Therefore, no need for neurosurgical consult. Will treat symptomatically.  PT/OT recommended nursing home placement. Patient has been placed on neck collar, but she has poor compliance. Continue to follow, patient need placement. Per discussion with daughter, we will not accelerate treatment.  She is still hospice appropriate. Per family request, patient can be off neck collar while in bed as we are leaning towards comfort.   Mixed Alzheimer's and vascular dementia. Patient has  acquired advanced dementia, will continue to monitor for delirium.   Essential hypertension. Continue some home medicines.   History of DVT. Patient not currently on anticoagulation.       Subjective:  Patient refused to take any medicine today.  But she is eating.  Physical Exam: Vitals:   06/03/24 0900 06/03/24 1527 06/03/24 1905 06/04/24 0357  BP: 127/69 (!) 146/64 (!) 113/50 (!) 116/54  Pulse: 67 70 75 80  Resp: 18 16    Temp: 98.2 F (36.8 C) (!) 97.5 F (36.4 C) 98.9 F (37.2 C) 98.9 F (37.2 C)  TempSrc: Oral  Axillary Axillary  SpO2: 99% 98% 97% 97%  Weight:      Height:       General exam: Appears calm and comfortable  Respiratory system: Clear to auscultation. Respiratory effort normal. Cardiovascular system: S1 & S2 heard, RRR. No JVD, murmurs, rubs, gallops or clicks. No pedal edema. Gastrointestinal system: Abdomen is nondistended, soft and nontender. No organomegaly or masses felt. Normal bowel sounds heard. Central nervous system: Alert and oriented x2. No focal neurological deficits. Extremities: Symmetric 5 x 5 power. Skin: No rashes, lesions or ulcers Psychiatry:  Mood & affect appropriate.    Data Reviewed:  There are no new results to review at this time.  Family Communication: Daughter updated at bedside.  Disposition: Status is: Inpatient Remains inpatient appropriate because: Severity of disease, unsafe discharge.     Time spent: 35 minutes  Author: Murvin Mana, MD 06/04/2024 9:40 AM  For on call review [email or web address]    "

## 2024-06-04 NOTE — Plan of Care (Signed)
  Problem: Pain Managment: Goal: General experience of comfort will improve and/or be controlled Outcome: Progressing   Problem: Safety: Goal: Ability to remain free from injury will improve Outcome: Progressing

## 2024-06-04 NOTE — Progress Notes (Signed)
 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 She was transported to ED after a fall and found to have new compression fracture. She was admitted to the hospital with diagnosis of Multiple Falls and per Dr. Norleen Oneill with [facility] this is a related hospital admission. Patient is a DNR. Per daughter's report patient will not be able to return to [facility] due to needing higher level of care. At this point daughter is unsure whether to pursue rehab as suggested by hospital therapist or to go straight to pursuing LTC.    She is inpatient appropriate with need for diagnostic testing related to new fall.   Vital Signs:  T 98.9 Axillary, BP 116/54, P 80, R 16, Oxi 97%  Input and Output: Output Cumulative Net I&O-  -[id number]  Abnormal Labs:  No new  Diagnostics:  No new  Hospital Assessment & Plan:  per Anne Mana MD progress note 12.25.25 Frequent falls. Worsening C1 compression fracture with old fracture. Acute on chronic T4 compression fracture, Chronic compression fracture of T3. Acute compression fracture of L4. Mild displaced right pubic bone fracture. Patient had multiple fractures from the fall, she had a frequent falls in the past due to advanced age and dementia. Discussed with patient daughter, family does not want any intervention.  Therefore, no need for neurosurgical consult. Will treat symptomatically.  PT/OT recommended nursing home placement. Patient has been placed on neck collar, but she has poor compliance. Continue to follow, patient need placement. Per discussion with daughter, we will not accelerate treatment.  She is still hospice appropriate. Per family request, patient can be off neck collar while in bed as we are leaning towards comfort.   Mixed Alzheimer's and vascular  dementia. Patient has acquired advanced dementia, will continue to monitor for delirium.   Essential hypertension. Continue some home medicines.   History of DVT. Patient not currently on anticoagulation. ________________________________  Discharge Planning- Ongoing, Rehab vs LTC with hospice Family Contact- Talked with daughter Anne Oneill IDT: updated Goals of Care: DNR  Please do not hesitate to call for any hospice related questions or concerns.  Anne Oneill Na, RN Nurse Liaison [phone]

## 2024-06-04 NOTE — TOC Initial Note (Signed)
 Transition of Care [facility]) - Initial/Assessment Note    Patient Details  Name: Anne Oneill MRN: [id number] Date of Birth: [birth year]  Transition of Care [facility]) CM/SW Contact:    Nathanael CHRISTELLA Ring, RN Phone Number: 06/04/2024, 2:48 PM  Clinical Narrative:                 Patient came into the hospital from [facility].  Fredick said that they can no longer meet her needs.  CM met with patient and her daughter at the bedside, introduced self and explained role in DC planning.  Patient is active with [facility] for hospice.  If they decide to go for STR they will have to revoke hospice services, daughter would like to talk with her brother tomorrow to see if they decide on STR or just going straight to LTC.  Patient does have a LTC policy that is active and has been paying for [facility].   TOC will cont to follow.   Expected Discharge Plan: Skilled Nursing Facility Barriers to Discharge: Continued Medical Work up   Patient Goals and CMS Choice Patient states their goals for this hospitalization and ongoing recovery are:: Daughter is not sure if she wants to pursue STR or just go straight to LTC CMS Medicare.gov Compare Post Acute Care list provided to:: Patient Represenative (must comment) Choice offered to / list presented to : Adult Children      Expected Discharge Plan and Services   Discharge Planning Services: CM Consult Post Acute Care Choice: Skilled Nursing Facility Living arrangements for the past 2 months: Assisted Living Facility                 DME Arranged: N/A                    Prior Living Arrangements/Services Living arrangements for the past 2 months: Assisted Living Facility Lives with:: Facility Resident Patient language and need for interpreter reviewed:: Yes Do you feel safe going back to the place where you live?: Yes      Need for Family Participation in Patient Care: Yes (Comment) Care giver support system in place?: Yes (comment)    Criminal Activity/Legal Involvement Pertinent to Current Situation/Hospitalization: No - Comment as needed  Activities of Daily Living   ADL Screening (condition at time of admission) Independently performs ADLs?: No Does the patient have a NEW difficulty with bathing/dressing/toileting/self-feeding that is expected to last >3 days?: Yes (Initiates electronic notice to provider for possible OT consult) Does the patient have a NEW difficulty with getting in/out of bed, walking, or climbing stairs that is expected to last >3 days?: Yes (Initiates electronic notice to provider for possible PT consult) Does the patient have a NEW difficulty with communication that is expected to last >3 days?: No Is the patient deaf or have difficulty hearing?: No Does the patient have difficulty seeing, even when wearing glasses/contacts?: No Does the patient have difficulty concentrating, remembering, or making decisions?: Yes  Permission Sought/Granted Permission sought to share information with : Family Supports, Oceanographer granted to share information with : Yes, Verbal Permission Granted  Share Information with NAME: [name]  Permission granted to share info w AGENCY: SNF's  Permission granted to share info w Relationship: daughter  Permission granted to share info w Contact Information: [phone]  Emotional Assessment Appearance:: Appears stated age Attitude/Demeanor/Rapport: Lethargic Affect (typically observed): Calm Orientation: : Oriented to Self Alcohol / Substance Use: Not Applicable Psych Involvement: No (  comment)  Admission diagnosis:  Multiple falls [R29.6] Fall, initial encounter [W19.XXXA] Pain of right hip [M25.551] Closed fracture of cervical vertebra, unspecified cervical vertebral level, initial encounter (HCC) [S12.9XXA] Closed fracture of fourth lumbar vertebra, unspecified fracture morphology, initial encounter (HCC) [S32.049A] Acute  midline low back pain, unspecified whether sciatica present [M54.50] Patient Active Problem List   Diagnosis Date Noted   Multiple falls 06/02/2024   Closed compression fracture of L4 vertebra (HCC) 06/02/2024   Compression fracture of T3 vertebra (HCC) 06/02/2024   HTN (hypertension) 06/02/2024   Closed left femoral fracture (HCC) 02/11/2024   Closed cervical spine fracture (HCC) 02/11/2024   Closed fracture of odontoid process of axis (HCC) 02/11/2024   Suprapubic pain 02/26/2022   Drop in hemoglobin 02/25/2022   Thrombocytopenia 02/25/2022   Impaired fasting glucose 02/25/2022   Pelvic ring fracture, sequela 02/24/2022   Sacral fracture, closed (HCC) 02/24/2022   Hypertensive urgency 02/23/2022   Dyspepsia 01/04/2020   Hiatal hernia 01/04/2020   History of breast cancer 01/04/2020   History of DVT (deep vein thrombosis) 01/04/2020   History of TIA (transient ischemic attack) 01/04/2020   Hyperlipidemia 01/04/2020   Mitral valve prolapse 01/04/2020   Osteoporosis 01/04/2020   Mixed Alzheimer's and vascular dementia (HCC) 09/06/2019   Meningioma (HCC) 02/11/2018   PCP:  Shelley Loring, Pllc Pharmacy:   [id number] [place], [place] - [address] AT [facility] OF [address] [place] [address] Phone: [phone] Fax: [phone]  [facility] [place], [place] [address] [place] [address] Phone: [phone] Fax: [phone]     Social Drivers of Health (SDOH) Social History: SDOH Screenings   Food Insecurity: No Food Insecurity (06/02/2024)  Housing: Low Risk (06/02/2024)  Transportation Needs: No Transportation Needs (06/02/2024)  Utilities: Not At Risk (06/02/2024)  Financial Resource Strain: Low Risk  (03/24/2024)   Received from [facility]  Social Connections: Socially Isolated (06/02/2024)  Tobacco Use: Low Risk  (06/02/2024)   SDOH Interventions:     Readmission Risk Interventions     No data to display

## 2024-06-04 NOTE — NC FL2 (Signed)
 " [place]  MEDICAID FL2 LEVEL OF CARE FORM     IDENTIFICATION  Patient Name: Anne Oneill Birthdate: September 30, 1926 Sex: female Admission Date (Current Location): 06/02/2024  [facility] and [place] Number:  Chiropodist and Address:  [facility], [address] [place], [place] [address]      Provider Number: [id number]  Attending Physician Name and Address:  Laurita Pillion, MD  Relative Name and Phone Number:  [name]- daughter- [phone]    Current Level of Care: Hospital Recommended Level of Care: Skilled Nursing Facility Prior Approval Number:    Date Approved/Denied:   PASRR Number: [id number]  Discharge Plan: SNF    Current Diagnoses: Patient Active Problem List   Diagnosis Date Noted   Multiple falls 06/02/2024   Closed compression fracture of L4 vertebra (HCC) 06/02/2024   Compression fracture of T3 vertebra (HCC) 06/02/2024   HTN (hypertension) 06/02/2024   Closed left femoral fracture (HCC) 02/11/2024   Closed cervical spine fracture (HCC) 02/11/2024   Closed fracture of odontoid process of axis (HCC) 02/11/2024   Suprapubic pain 02/26/2022   Drop in hemoglobin 02/25/2022   Thrombocytopenia 02/25/2022   Impaired fasting glucose 02/25/2022   Pelvic ring fracture, sequela 02/24/2022   Sacral fracture, closed (HCC) 02/24/2022   Hypertensive urgency 02/23/2022   Dyspepsia 01/04/2020   Hiatal hernia 01/04/2020   History of breast cancer 01/04/2020   History of DVT (deep vein thrombosis) 01/04/2020   History of TIA (transient ischemic attack) 01/04/2020   Hyperlipidemia 01/04/2020   Mitral valve prolapse 01/04/2020   Osteoporosis 01/04/2020   Mixed Alzheimer's and vascular dementia (HCC) 09/06/2019   Meningioma (HCC) 02/11/2018    Orientation RESPIRATION BLADDER Height & Weight        Normal Incontinent, External catheter Weight: 55.5 kg Height:  5' (152.4 cm)  BEHAVIORAL SYMPTOMS/MOOD NEUROLOGICAL BOWEL  NUTRITION STATUS      Incontinent Diet (Regular)  AMBULATORY STATUS COMMUNICATION OF NEEDS Skin   Extensive Assist Verbally Normal                       Personal Care Assistance Level of Assistance  Bathing, Feeding, Dressing Bathing Assistance: Maximum assistance Feeding assistance: Limited assistance Dressing Assistance: Maximum assistance     Functional Limitations Info  Sight, Hearing Sight Info: Impaired (Glasses) Hearing Info: Impaired      SPECIAL CARE FACTORS FREQUENCY                       Contractures Contractures Info: Not present    Additional Factors Info  Code Status, Allergies Code Status Info: DNR Allergies Info: Codeine Not Specified  Nausea Only   Ditropan (oxybutynin) Not Specified     Hydrocodone Not Specified  Nausea Only   Morphine  Not Specified  Nausea Only   Oxybutynin Chloride Not Specified  Other (See Comments)   Latex Low  Rash           Current Medications (06/04/2024):  This is the current hospital active medication list Current Facility-Administered Medications  Medication Dose Route Frequency Provider Last Rate Last Admin   acetaminophen  (TYLENOL ) tablet 650 mg  650 mg Oral Q6H PRN Niu, Xilin, MD       amLODipine  (NORVASC ) tablet 10 mg  10 mg Oral Daily Niu, Xilin, MD   10 mg at 06/04/24 9050   enoxaparin  (LOVENOX ) injection 40 mg  40 mg Subcutaneous Q24H Belue, Nathan S, RPH   40 mg at  06/04/24 0950   escitalopram  (LEXAPRO ) tablet 10 mg  10 mg Oral Daily Niu, Xilin, MD   10 mg at 06/04/24 9050   fentaNYL  (SUBLIMAZE ) injection 12.5 mcg  12.5 mcg Intravenous Q3H PRN Niu, Xilin, MD       hydrALAZINE  (APRESOLINE ) injection 5 mg  5 mg Intravenous Q2H PRN Niu, Xilin, MD       lidocaine  (LIDODERM ) 5 % 1 patch  1 patch Transdermal Q24H Niu, Xilin, MD   1 patch at 06/03/24 2113   methocarbamol  (ROBAXIN ) tablet 500 mg  500 mg Oral Q8H PRN Niu, Xilin, MD       metoprolol  succinate (TOPROL -XL) 24 hr tablet 50 mg  50 mg Oral Daily Niu,  Xilin, MD   50 mg at 06/04/24 9050   ondansetron  (ZOFRAN ) injection 4 mg  4 mg Intravenous Q8H PRN Niu, Xilin, MD       Oral care mouth rinse  15 mL Mouth Rinse PRN Niu, Xilin, MD       oxyCODONE -acetaminophen  (PERCOCET/ROXICET) 5-325 MG per tablet 1 tablet  1 tablet Oral Q4H PRN Niu, Xilin, MD   1 tablet at 06/04/24 9050   polyethylene glycol (MIRALAX  / GLYCOLAX ) packet 17 g  17 g Oral Daily PRN Niu, Xilin, MD   17 g at 06/04/24 9050   senna-docusate (Senokot-S) tablet 1 tablet  1 tablet Oral BID PRN Niu, Xilin, MD   1 tablet at 06/04/24 9050   spironolactone  (ALDACTONE ) tablet 37.5 mg  37.5 mg Oral Daily Niu, Xilin, MD   37.5 mg at 06/04/24 1002     Discharge Medications: Please see discharge summary for a list of discharge medications.  Relevant Imaging Results:  Relevant Lab Results:   Additional Information SS #: [id number]  Nathanael CHRISTELLA Ring, RN     "

## 2024-06-04 NOTE — Progress Notes (Signed)
 Patient is refusing to answer LOC questions, vital signs, and medications this morning.   MD aware.

## 2024-06-05 DIAGNOSIS — R296 Repeated falls: Secondary | ICD-10-CM | POA: Diagnosis not present

## 2024-06-05 DIAGNOSIS — S32040G Wedge compression fracture of fourth lumbar vertebra, subsequent encounter for fracture with delayed healing: Secondary | ICD-10-CM | POA: Diagnosis not present

## 2024-06-05 DIAGNOSIS — S12101G Unspecified nondisplaced fracture of second cervical vertebra, subsequent encounter for fracture with delayed healing: Secondary | ICD-10-CM | POA: Diagnosis not present

## 2024-06-05 NOTE — Plan of Care (Signed)

## 2024-06-05 NOTE — Progress Notes (Signed)
 Physical Therapy Treatment Patient Details Name: Anne Oneill MRN: [id number] DOB: Sep 19, 1926 Today's Date: 06/05/2024   History of Present Illness 89 y/o female presented to ED on 06/02/24 for multiple falls. Sustained L4 and T3 compression fractures. Treating non-operatively. PMH: dementia, known C1-2 fx on C-collar, HTN, DVT, TIA, meningioma, breast cancer    PT Comments  Pt was premedicated for session ~ 25 minutes prior. On first attempt requested pain meds prior to OOB activity. She agrees 25 minutes later after being premedicated with supportive daughter present throughout session. Pt does have severe fear of falling however with vcs for relaxation and sequencing, did put for great effort and fully participated in session. Pt does require increased time for all desired task. TCs/VCs for log roll sequencing towards the R with pt requiring max assist to achieve EOB sitting. Cervical collar was place in bed prior to EOB sitting. Elected not to use TLSO in room 2/2 for pt did not need for comfort. Pt tolerated standing at EOB 2 x ~ 2 minutes. She has severe posterior push that improved with vcing and heel raise exercises. No C/O pain. Author feels pt is most limited by anxiety/fear of falling than overall pain. She did have incontinence episode on 2nd STS in which pt required full linen/gown change once returned to bed. Acute PT will continue to follow and progress per current POC. DC recs remain appropriate.     If plan is discharge home, recommend the following: A lot of help with walking and/or transfers;A lot of help with bathing/dressing/bathroom;Assistance with cooking/housework;Assistance with feeding;Direct supervision/assist for medications management;Direct supervision/assist for financial management;Assist for transportation;Help with stairs or ramp for entrance;Supervision due to cognitive status     Equipment Recommendations  Other (comment) (Defer to next level of care)        Precautions / Restrictions Precautions Precautions: Fall Recall of Precautions/Restrictions: Impaired Required Braces or Orthoses: Cervical Brace (LSO for comfort) Cervical Brace: Hard collar Restrictions Weight Bearing Restrictions Per Provider Order: No     Mobility  Bed Mobility Overal bed mobility: Needs Assistance Bed Mobility: Rolling, Sidelying to Sit, Sit to Sidelying, Supine to Sit, Sit to Supine Rolling: Max assist, Used rails Sidelying to sit: Max assist, Used rails Supine to sit: Max assist, Used rails Sit to supine: Max assist Sit to sidelying: Max assist General bed mobility comments: pt requires incresaed time + max assist to roll L/R. Pt rolled R to short sit with max assist + vcs for handplacement and technique.    Transfers Overall transfer level: Needs assistance Equipment used: None, 2 person hand held assist Transfers: Sit to/from Stand Sit to Stand: Mod assist, From elevated surface, Max assist  General transfer comment: mod assist from elevated bed height then max assist from lowest height. pt very fearful of falling however once in standing, tolerated standing x 1-2 minutes with author supporting pt under axillas and pt holding author triceps. pt does have posterior push however did perfrom fwd wt shift and heel raises 10x on 2nd STS. no C/O pain but does endorse fear of falling    Ambulation/Gait  General Gait Details: pt did stand EOB however unable to take formal steps along EOB due to fear of falling and poor wt acceptance on RLE. RLE pain > LLE in standing. Elected not to use TLSO for comfort only    Balance Overall balance assessment: Needs assistance Sitting-balance support: Bilateral upper extremity supported, Feet supported Sitting balance-Leahy Scale: Fair     Standing balance  support: Bilateral upper extremity supported, During functional activity, Reliant on assistive device for balance Standing balance-Leahy Scale: Zero Standing balance  comment: pt has severe posterior push most likely due to severe fear of falling in standing. was able to correct but unable to maintain       Communication Communication Communication: Impaired Factors Affecting Communication: Hearing impaired  Cognition Arousal: Alert Behavior During Therapy: Anxious, WFL for tasks assessed/performed   PT - Cognitive impairments: History of cognitive impairments    PT - Cognition Comments: Pt was alert and conversational on first attempt however requested pain meds prior to OOB activity. When author returned ~ 25 minutes after meds given pt less conversational however able to tolerate increased activity without C/O pain. Pt is severely limited by fear of falling with any/all mobility and transfers Following commands: Intact, Impaired Following commands impaired: Follows one step commands with increased time, Follows multi-step commands inconsistently    Cueing Cueing Techniques: Verbal cues, Tactile cues, Visual cues         Pertinent Vitals/Pain Pain Assessment Pain Assessment: 0-10 Pain Location: generalized Pain Descriptors / Indicators: Grimacing, Guarding Pain Intervention(s): Limited activity within patient's tolerance, Monitored during session, Premedicated before session, Repositioned     PT Goals (current goals can now be found in the care plan section) Acute Rehab PT Goals Patient Stated Goal: none stated Progress towards PT goals: Progressing toward goals    Frequency    Min 1X/week       AM-PAC PT 6 Clicks Mobility   Outcome Measure  Help needed turning from your back to your side while in a flat bed without using bedrails?: A Lot Help needed moving from lying on your back to sitting on the side of a flat bed without using bedrails?: A Lot Help needed moving to and from a bed to a chair (including a wheelchair)?: A Lot Help needed standing up from a chair using your arms (e.g., wheelchair or bedside chair)?: A Lot Help  needed to walk in hospital room?: Total Help needed climbing 3-5 steps with a railing? : Total 6 Click Score: 10    End of Session   Activity Tolerance: Patient tolerated treatment well Patient left: in bed;with call bell/phone within reach;with bed alarm set Nurse Communication: Mobility status PT Visit Diagnosis: Repeated falls (R29.6);Muscle weakness (generalized) (M62.81);Other abnormalities of gait and mobility (R26.89);Unsteadiness on feet (R26.81);History of falling (Z91.81);Difficulty in walking, not elsewhere classified (R26.2)     Time: 8585-8553 PT Time Calculation (min) (ACUTE ONLY): 32 min  Charges:    $Therapeutic Activity: 23-37 mins PT General Charges $$ ACUTE PT VISIT: 1 Visit                    Rankin Essex PTA 06/05/2024, 3:09 PM

## 2024-06-05 NOTE — Progress Notes (Signed)
" °  Progress Note   Patient: Anne Oneill FMW:[id number] DOB: December 02, 1926 DOA: 06/02/2024     2 DOS: the patient was seen and examined on 06/05/2024   Brief hospital course: Anne Oneill is a 89 y.o. female with medical history significant of dementia, fall, known C1-C2 fx on C-collar, HTN, HLD,TIA, DVT (off Eliquis  per her daughter), thrombocytopenia, meningioma, breast cancer, under hospice care, who presents with multiple falls.  CT scan showed known C1 fracture, but slight interval increase in displacement.  Chronic compression fracture in T3, acute on chronic T4 compression fracture, acute compression fracture of L4.  Mild displaced fracture of the right pubic bone.     Principal Problem:   Multiple falls Active Problems:   Closed cervical spine fracture (HCC)   Closed compression fracture of L4 vertebra (HCC)   Compression fracture of T3 vertebra (HCC)   HTN (hypertension)   History of DVT (deep vein thrombosis)   Mixed Alzheimer's and vascular dementia (HCC)   Assessment and Plan: Frequent falls. Worsening C1 compression fracture with old fracture. Acute on chronic T4 compression fracture, Chronic compression fracture of T3. Acute compression fracture of L4. Mild displaced right pubic bone fracture. Patient had multiple fractures from the fall, she had a frequent falls in the past due to advanced age and dementia. Discussed with patient daughter, family does not want any intervention.  Therefore, no need for neurosurgical consult. Will treat symptomatically.  PT/OT recommended nursing home placement. Patient has been placed on neck collar, but she has poor compliance. Continue to follow, patient need placement. Per discussion with daughter, we will not accelerate treatment.  She is still hospice appropriate. Per family request, patient can be off neck collar while in bed as we are leaning towards comfort. Patient doing well, currently pending nursing home placement.    Mixed Alzheimer's and vascular dementia. Patient has acquired advanced dementia, no delirium.  Slept well last night.   Essential hypertension. Continue some home medicines.   History of DVT. Patient not currently on anticoagulation.       Subjective:  Patient doing well today, no significant pain.  Has some confusion at baseline.  Physical Exam: Vitals:   06/04/24 1626 06/04/24 2018 06/05/24 0410 06/05/24 0748  BP: (!) 151/64 (!) 140/58 (!) 137/59 138/64  Pulse: 74 78 77 78  Resp: 18 20 18 18   Temp: 98 F (36.7 C) 98.5 F (36.9 C) 98 F (36.7 C) 97.8 F (36.6 C)  TempSrc:  Oral Oral   SpO2: 100% 92% 96% 97%  Weight:      Height:       General exam: Appears calm and comfortable  Respiratory system: Clear to auscultation. Respiratory effort normal. Cardiovascular system: S1 & S2 heard, RRR. No JVD, murmurs, rubs, gallops or clicks. No pedal edema. Gastrointestinal system: Abdomen is nondistended, soft and nontender. No organomegaly or masses felt. Normal bowel sounds heard. Central nervous system: Alert and oriented x2. No focal neurological deficits. Extremities: Symmetric 5 x 5 power. Skin: No rashes, lesions or ulcers Psychiatry: Judgement and insight appear normal. Mood & affect appropriate.    Data Reviewed:  There are no new results to review at this time.  Family Communication: None  Disposition: Status is: Inpatient Remains inpatient appropriate because: Unsafe discharge, pending placement.     Time spent: 35 minutes  Author: Murvin Mana, MD 06/05/2024 10:47 AM  For on call review [email or web address].    "

## 2024-06-05 NOTE — Progress Notes (Signed)
 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 She was transported to ED after a fall and found to have new compression fracture. She was admitted to the hospital with diagnosis of Multiple Falls and per Dr. Norleen Oneill with [facility] this is a related hospital admission. Patient is a DNR.   Bedside visit made.  Patient appears resting well.  No signs of distress noted.  Patients daughter is at beside and reports patient did eat breakfast well this morning.  PCG is anticipating the arrival of her brother tonight to help with decision making.  PCG would like to see how patient participates in PT today.  PCG is undecided if she would like to pursue LTC or SNF at discharge.    She is inpatient appropriate with need for on going pain management related to fall.    Vital Signs:  98.5 140/58 HR 78 RR 20 92% RA   Input and Output: not recorded/375ml   Abnormal Labs:  No new   Diagnostics:  No new   Hospital Assessment & Plan:  per Anne Mana MD progress note 12.26.25 Assessment and Plan: Frequent falls. Worsening C1 compression fracture with old fracture. Acute on chronic T4 compression fracture, Chronic compression fracture of T3. Acute compression fracture of L4. Mild displaced right pubic bone fracture. Patient had multiple fractures from the fall, she had a frequent falls in the past due to advanced age and dementia. Discussed with patient daughter, family does not want any intervention.  Therefore, no need for neurosurgical consult. Will treat symptomatically.  PT/OT recommended nursing home placement. Patient has been placed on neck collar, but she has poor compliance. Continue to follow, patient need placement. Per discussion with daughter, we will not accelerate treatment.  She is still hospice  appropriate. Per family request, patient can be off neck collar while in bed as we are leaning towards comfort. Patient doing well, currently pending nursing home placement.   Mixed Alzheimer's and vascular dementia. Patient has acquired advanced dementia, no delirium.  Slept well last night.   Essential hypertension. Continue some home medicines.   History of DVT. Patient not currently on anticoagulation. ________________________________   Discharge Planning- Ongoing, Rehab vs LTC with hospice Family Contact- Talked with daughter Anne Oneill IDT: updated Goals of Care: DNR   Please do not hesitate to call for any hospice related questions or concerns.   Anne Shed, LPN Nurse Liaison [phone]

## 2024-06-05 NOTE — Plan of Care (Signed)

## 2024-06-06 DIAGNOSIS — F0150 Vascular dementia without behavioral disturbance: Secondary | ICD-10-CM | POA: Diagnosis not present

## 2024-06-06 DIAGNOSIS — S12101G Unspecified nondisplaced fracture of second cervical vertebra, subsequent encounter for fracture with delayed healing: Secondary | ICD-10-CM | POA: Diagnosis not present

## 2024-06-06 DIAGNOSIS — R296 Repeated falls: Secondary | ICD-10-CM | POA: Diagnosis not present

## 2024-06-06 DIAGNOSIS — G309 Alzheimer's disease, unspecified: Secondary | ICD-10-CM | POA: Diagnosis not present

## 2024-06-06 MED ORDER — OXYCODONE-ACETAMINOPHEN 5-325 MG PO TABS
0.5000 | ORAL_TABLET | ORAL | Status: DC | PRN
  Administered 2024-06-06 (×2): 0.5 via ORAL
  Filled 2024-06-06 (×2): qty 1

## 2024-06-06 NOTE — Progress Notes (Signed)
 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 She was transported to ED after a fall and found to have new compression fracture. She was admitted to the hospital with diagnosis of Multiple Falls and per Dr. Norleen Laurence with [facility] this is a related hospital admission. Patient is a DNR. Per daughter's report patient will not be able to return to [facility] due to needing higher level of care.   Patient is appropriate for GIP level of care requiring skilled PT for mobility post fall.  Patient is sitting up in the bed awake and pleasantly confused.  Patient's son is at the bedside.  He got in last night from out of town.  I called to speak with [name], daughter to discuss discharge plans.  She states that pending PT recommendations- they are thinking about rehab.  Discussed hospice philosophy & HMB related to STR as well as bed search requiring a higher level of care post hospital or STR discharge.  She verbalized understanding of the conversation and will want to revoke her hospice benefit if PT is recommending STR.  Will likely sign revocation tomorrow, pending PT progress visit today.   Vital Signs:  T 97.7  BP 139/66, P 76, R 15, Oxi 95% RA   Input and Output: None Recorded   Abnormal Labs:  No new   Diagnostics:  No new   Hospital Assessment & Plan:  per Murvin Mana MD progress note 12.27.25 Frequent falls. Worsening C1 compression fracture with old fracture. Acute on chronic T4 compression fracture, Chronic compression fracture of T3. Acute compression fracture of L4. Mild displaced right pubic bone fracture. Patient had multiple fractures from the fall, she had a frequent falls in the past due to advanced age and dementia. Discussed with patient daughter, family does not want any intervention.   Therefore, no need for neurosurgical consult. Will treat symptomatically.  PT/OT recommended nursing home placement. Patient has been placed on neck collar, but she has poor compliance. Continue to follow, patient need placement. Per discussion with daughter, we will not accelerate treatment.  She is still hospice appropriate. Per family request, patient can be off neck collar while in bed as we are leaning towards comfort. Patient doing well, currently pending nursing home placement. No new event, no change in treatment plan.   Mixed Alzheimer's and vascular dementia. Patient has acquired advanced dementia, no delirium.   Patient has been sleeping well at night, no agitation or delirium.   Essential hypertension. Continue some home medicines.   History of DVT. Patient not currently on anticoagulation.  _______________________________   Discharge Planning- Ongoing, Rehab vs LTC with hospice Family Contact- Talked with [name] IDT: updated Goals of Care: DNR   Please do not hesitate to call for any hospice related questions or concerns.   [name], RN Nurse Liaison [phone]

## 2024-06-06 NOTE — Progress Notes (Signed)
 " Progress Note   Patient: Anne Oneill FMW:[id number] DOB: [birth year] DOA: 06/02/2024     3 DOS: the patient was seen and examined on 06/06/2024   Brief hospital course: RELLA EGELSTON is a 89 y.o. female with medical history significant of dementia, fall, known C1-C2 fx on C-collar, HTN, HLD,TIA, DVT (off Eliquis  per her daughter), thrombocytopenia, meningioma, breast cancer, under hospice care, who presents with multiple falls.  CT scan showed known C1 fracture, but slight interval increase in displacement.  Chronic compression fracture in T3, acute on chronic T4 compression fracture, acute compression fracture of L4.  Mild displaced fracture of the right pubic bone.     Principal Problem:   Multiple falls Active Problems:   Closed cervical spine fracture (HCC)   Closed compression fracture of L4 vertebra (HCC)   Compression fracture of T3 vertebra (HCC)   HTN (hypertension)   History of DVT (deep vein thrombosis)   Mixed Alzheimer's and vascular dementia (HCC)   Assessment and Plan: Frequent falls. Worsening C1 compression fracture with old fracture. Acute on chronic T4 compression fracture, Chronic compression fracture of T3. Acute compression fracture of L4. Mild displaced right pubic bone fracture. Patient had multiple fractures from the fall, she had a frequent falls in the past due to advanced age and dementia. Discussed with patient daughter, family does not want any intervention.  Therefore, no need for neurosurgical consult. Will treat symptomatically.  PT/OT recommended nursing home placement. Patient has been placed on neck collar, but she has poor compliance. Continue to follow, patient need placement. Per discussion with daughter, we will not accelerate treatment.  She is still hospice appropriate. Per family request, patient can be off neck collar while in bed as we are leaning towards comfort. Patient doing well, currently pending nursing home  placement. No new event, no change in treatment plan.   Mixed Alzheimer's and vascular dementia. Patient has acquired advanced dementia, no delirium.   Patient has been sleeping well at night, no agitation or delirium.   Essential hypertension. Continue some home medicines.   History of DVT. Patient not currently on anticoagulation.       Subjective:  Patient doing well today, pain under control.  Good appetite.  Physical Exam: Vitals:   06/05/24 1554 06/05/24 1938 06/06/24 0400 06/06/24 0807  BP: 136/62 (!) 151/64 129/70 139/66  Pulse: 68 76 81 76  Resp: 16 16 16 15   Temp: (!) 97.5 F (36.4 C) (!) 97.5 F (36.4 C) (!) 97.5 F (36.4 C) 97.7 F (36.5 C)  TempSrc:   Oral Oral  SpO2: 95% 95% 98% 95%  Weight:      Height:       General exam: Appears calm and comfortable  Respiratory system: Clear to auscultation. Respiratory effort normal. Cardiovascular system: S1 & S2 heard, RRR. No JVD, murmurs, rubs, gallops or clicks. No pedal edema. Gastrointestinal system: Abdomen is nondistended, soft and nontender. No organomegaly or masses felt. Normal bowel sounds heard. Central nervous system: Alert and oriented x2. No focal neurological deficits. Extremities: Symmetric 5 x 5 power. Skin: No rashes, lesions or ulcers Psychiatry: Judgement and insight appear normal. Mood & affect appropriate.    Data Reviewed:  There are no new results to review at this time.  Family Communication: Son and daughter updated at the bedside.  Disposition: Status is: Inpatient Remains inpatient appropriate because: Unsafe discharge, pending placement.     Time spent: 35 minutes  Author: Murvin Mana, MD 06/06/2024 12:05 PM  For on call review [email or web address]    "

## 2024-06-06 NOTE — Plan of Care (Signed)
" °  Problem: Clinical Measurements: Goal: Ability to maintain clinical measurements within normal limits will improve Outcome: Progressing   Problem: Pain Managment: Goal: General experience of comfort will improve and/or be controlled Outcome: Progressing   Problem: Elimination: Goal: Will not experience complications related to bowel motility Outcome: Progressing   Problem: Coping: Goal: Level of anxiety will decrease Outcome: Progressing   Problem: Safety: Goal: Ability to remain free from injury will improve Outcome: Progressing   Problem: Skin Integrity: Goal: Risk for impaired skin integrity will decrease Outcome: Progressing   "

## 2024-06-07 DIAGNOSIS — S32040G Wedge compression fracture of fourth lumbar vertebra, subsequent encounter for fracture with delayed healing: Secondary | ICD-10-CM | POA: Diagnosis not present

## 2024-06-07 DIAGNOSIS — R296 Repeated falls: Secondary | ICD-10-CM | POA: Diagnosis not present

## 2024-06-07 DIAGNOSIS — S12101G Unspecified nondisplaced fracture of second cervical vertebra, subsequent encounter for fracture with delayed healing: Secondary | ICD-10-CM | POA: Diagnosis not present

## 2024-06-07 LAB — GLUCOSE, CAPILLARY: Glucose-Capillary: 174 mg/dL — ABNORMAL HIGH (ref 70–99)

## 2024-06-07 NOTE — Plan of Care (Signed)
  Problem: Clinical Measurements: Goal: Ability to maintain clinical measurements within normal limits will improve Outcome: Progressing   Problem: Coping: Goal: Level of anxiety will decrease Outcome: Progressing   Problem: Elimination: Goal: Will not experience complications related to bowel motility Outcome: Progressing   Problem: Pain Managment: Goal: General experience of comfort will improve and/or be controlled Outcome: Progressing   Problem: Safety: Goal: Ability to remain free from injury will improve Outcome: Progressing

## 2024-06-07 NOTE — Progress Notes (Signed)
" °  Progress Note   Patient: Anne Oneill FMW:[id number] DOB: [birth year] DOA: 06/02/2024     4 DOS: the patient was seen and examined on 06/07/2024   Brief hospital course: Anne Oneill is a 89 y.o. female with medical history significant of dementia, fall, known C1-C2 fx on C-collar, HTN, HLD,TIA, DVT (off Eliquis  per her daughter), thrombocytopenia, meningioma, breast cancer, under hospice care, who presents with multiple falls.  CT scan showed known C1 fracture, but slight interval increase in displacement.  Chronic compression fracture in T3, acute on chronic T4 compression fracture, acute compression fracture of L4.  Mild displaced fracture of the right pubic bone.     Principal Problem:   Multiple falls Active Problems:   Closed cervical spine fracture (HCC)   Closed compression fracture of L4 vertebra (HCC)   Compression fracture of T3 vertebra (HCC)   HTN (hypertension)   History of DVT (deep vein thrombosis)   Mixed Alzheimer's and vascular dementia (HCC)   Assessment and Plan: Frequent falls. Worsening C1 compression fracture with old fracture. Acute on chronic T4 compression fracture, Chronic compression fracture of T3. Acute compression fracture of L4. Mild displaced right pubic bone fracture. Patient had multiple fractures from the fall, she had a frequent falls in the past due to advanced age and dementia. Discussed with patient daughter, family does not want any intervention.  Therefore, no need for neurosurgical consult. Will treat symptomatically.  PT/OT recommended nursing home placement. Patient has been placed on neck collar, but she has poor compliance. Continue to follow, patient need placement. Per discussion with daughter, we will not accelerate treatment.  She is still hospice appropriate. Per family request, patient can be off neck collar while in bed as we are leaning towards comfort. Patient doing well, currently pending nursing home  placement. Patient still has some pain, will treated with reduced the pain medicine.   Mixed Alzheimer's and vascular dementia. Patient has acquired advanced dementia, no delirium.   Patient has been sleeping well at night, no agitation or delirium.   Essential hypertension. Continue some home medicines.   History of DVT. Patient not currently on anticoagulation.       Subjective:  Patient was complaining some back pain, otherwise doing well.  Physical Exam: Vitals:   06/06/24 1559 06/06/24 1938 06/07/24 0420 06/07/24 0751  BP: (!) 137/59 133/64 129/69 138/61  Pulse: 73 76 75 76  Resp: 14 17 16 18   Temp: 98 F (36.7 C) 98.6 F (37 C) 98 F (36.7 C) 98.1 F (36.7 C)  TempSrc:    Oral  SpO2: 96% 95% 96% 96%  Weight:      Height:       General exam: Appears calm and comfortable  Respiratory system: Clear to auscultation. Respiratory effort normal. Cardiovascular system: S1 & S2 heard, RRR. No JVD, murmurs, rubs, gallops or clicks. No pedal edema. Gastrointestinal system: Abdomen is nondistended, soft and nontender. No organomegaly or masses felt. Normal bowel sounds heard. Central nervous system: Alert and oriented x2. No focal neurological deficits. Extremities: Symmetric 5 x 5 power. Skin: No rashes, lesions or ulcers Psychiatry: Mood & affect appropriate.    Data Reviewed:  There are no new results to review at this time.  Family Communication: None  Disposition: Status is: Inpatient Remains inpatient appropriate because: Unsafe discharge, pending nursing placement.     Time spent: 35 minutes  Author: Murvin Mana, MD 06/07/2024 10:43 AM  For on call review [email or web address]    "

## 2024-06-07 NOTE — Plan of Care (Signed)

## 2024-06-07 NOTE — Progress Notes (Signed)
 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 She was transported to ED after a fall and found to have new compression fracture. She was admitted to the hospital with diagnosis of Multiple Falls and per Dr. Norleen Laurence with [facility] this is a related hospital admission. Patient is a DNR. Per daughter's report patient will not be able to return to [facility] due to needing higher level of care.    Patient is appropriate for GIP level of care requiring skilled PT for mobility post fall with multiple fractures.   Patient lying in bed today- pleasantly confused.  PT worked with patient again today.  Spoke with patient's daughter, [name] to discuss discharge options and plans.  [name] said the PT today was recommending rehab to help build patient's strength and to help with positioning to find best comfort position.  Patient minimally participated today due to pain.  Discussed goals of care with [name].  She and her brother want [name] to be comfortable.  Quality of life is most important.  It is difficult due to patient's multiple falls and continued fall risk because of the dementia and her continuing to get out of bed.  Safety along with quality is most important.  Because patient continues to work with physical therapy daily per hospital plan of care- we will re-address tomorrow with hospital discharge planner and hospital team.  No bed search has been initiated.   Vital Signs:  T 98.1 oral,  BP 138/61, P 76, R 18, Oxi 96% RA   Input and Output: None Recorded   Abnormal Labs:  No new   Diagnostics:  No new   Hospital Assessment & Plan:  per Murvin Mana MD progress note 12.28.25  Frequent falls. Worsening C1 compression fracture with old fracture. Acute on chronic T4 compression fracture, Chronic compression  fracture of T3. Acute compression fracture of L4. Mild displaced right pubic bone fracture. Patient had multiple fractures from the fall, she had a frequent falls in the past due to advanced age and dementia. Discussed with patient daughter, family does not want any intervention.  Therefore, no need for neurosurgical consult. Will treat symptomatically.  PT/OT recommended nursing home placement. Patient has been placed on neck collar, but she has poor compliance. Continue to follow, patient need placement. Per discussion with daughter, we will not accelerate treatment.  She is still hospice appropriate. Per family request, patient can be off neck collar while in bed as we are leaning towards comfort. Patient doing well, currently pending nursing home placement. Patient still has some pain, will treated with reduced the pain medicine.   Mixed Alzheimer's and vascular dementia. Patient has acquired advanced dementia, no delirium.   Patient has been sleeping well at night, no agitation or delirium.   Essential hypertension. Continue some home medicines.   History of DVT. Patient not currently on anticoagulation.  _______________________________   Discharge Planning- Ongoing, Rehab vs LTC with hospice Family Contact- Talked with [name] IDT: updated Goals of Care: DNR   Please do not hesitate to call for any hospice related questions or concerns.   [name], RN Nurse Liaison [phone]

## 2024-06-07 NOTE — Progress Notes (Signed)
 Physical Therapy Treatment Patient Details Name: Anne Oneill MRN: [id number] DOB: [birth year] Today's Date: 06/07/2024   History of Present Illness 89 y/o female presented to ED on 06/02/24 for multiple falls. Sustained L4 and T3 compression fractures. Treating non-operatively. PMH: dementia, known C1-2 fx on C-collar, HTN, DVT, TIA, meningioma, breast cancer    PT Comments  Pleasant and agrees to session with encouragement.  She is assisted to EOB with mod a x 1.Once sitting she does stay EOB for about 10 minutes with min/mod a x 1 for support.  She leans L of offload R side which affects her sitting balance and she is unable to hold position for more than a brief moment without assist.  Time is spent in sitting to build comfort and confidence prior to standing attempts.  2 attempts made.   On first she does not engage, on second she does assist to standing but only stands for about 15 seconds before initiating return to sit.  She returns to supine with mod a x 1 and repositioned for comfort with daughters assist.   Hard cervical collar is worn during OOB mobility.  Daughter asking for soft cervical collar for comfort/ neck support in bed.  Will relay to MD   If plan is discharge home, recommend the following: A lot of help with bathing/dressing/bathroom;Assistance with cooking/housework;Assistance with feeding;Direct supervision/assist for medications management;Direct supervision/assist for financial management;Assist for transportation;Help with stairs or ramp for entrance;Supervision due to cognitive status;Two people to help with walking and/or transfers   Can travel by private vehicle        Equipment Recommendations       Recommendations for Other Services       Precautions / Restrictions Precautions Precautions: Fall Recall of Precautions/Restrictions: Impaired Required Braces or Orthoses: Cervical Brace (LSO for comfort) Cervical Brace: Hard collar Restrictions Weight  Bearing Restrictions Per Provider Order: No     Mobility  Bed Mobility Overal bed mobility: Needs Assistance Bed Mobility: Supine to Sit, Sit to Supine     Supine to sit: Mod assist Sit to supine: Mod assist     Patient Response: Cooperative  Transfers Overall transfer level: Needs assistance Equipment used: None Transfers: Sit to/from Stand Sit to Stand: Mod assist, From elevated surface, Max assist           General transfer comment: 2 attempts.  fist she does not engage and is stopped,  second she does stand briefly before retuning to sit    Ambulation/Gait                   Stairs             Wheelchair Mobility     Tilt Bed Tilt Bed Patient Response: Cooperative  Modified Rankin (Stroke Patients Only)       Balance Overall balance assessment: Needs assistance Sitting-balance support: Bilateral upper extremity supported, Feet supported Sitting balance-Leahy Scale: Poor Sitting balance - Comments: min/mod a at all times for sitting.  left lean   Standing balance support: Bilateral upper extremity supported, During functional activity, Reliant on assistive device for balance Standing balance-Leahy Scale: Zero Standing balance comment: pust lean and poor engagement to transition to standing                            Communication Communication Communication: Impaired Factors Affecting Communication: Hearing impaired  Cognition Arousal: Alert Behavior During Therapy: WFL for tasks assessed/performed   PT -  Cognitive impairments: History of cognitive impairments                       PT - Cognition Comments: generally pleasant and cooperative but hesitant to stand. Following commands: Intact, Impaired Following commands impaired: Follows one step commands with increased time, Follows multi-step commands inconsistently    Cueing Cueing Techniques: Verbal cues, Tactile cues, Visual cues  Exercises      General  Comments        Pertinent Vitals/Pain Pain Assessment Pain Assessment: Faces Faces Pain Scale: Hurts little more Pain Location: R side, does not specifically say but does lean left to offload R in sitting    Home Living                          Prior Function            PT Goals (current goals can now be found in the care plan section) Progress towards PT goals: Progressing toward goals    Frequency    Min 1X/week      PT Plan      Co-evaluation              AM-PAC PT 6 Clicks Mobility   Outcome Measure  Help needed turning from your back to your side while in a flat bed without using bedrails?: A Lot Help needed moving from lying on your back to sitting on the side of a flat bed without using bedrails?: A Lot Help needed moving to and from a bed to a chair (including a wheelchair)?: A Lot Help needed standing up from a chair using your arms (e.g., wheelchair or bedside chair)?: A Lot Help needed to walk in hospital room?: Total Help needed climbing 3-5 steps with a railing? : Total 6 Click Score: 10    End of Session Equipment Utilized During Treatment: Gait belt;Cervical collar Activity Tolerance: Patient tolerated treatment well Patient left: in bed;with call bell/phone within reach;with bed alarm set;with family/visitor present Nurse Communication: Mobility status PT Visit Diagnosis: Repeated falls (R29.6);Muscle weakness (generalized) (M62.81);Other abnormalities of gait and mobility (R26.89);Unsteadiness on feet (R26.81);History of falling (Z91.81);Difficulty in walking, not elsewhere classified (R26.2)     Time: 8992-8972 PT Time Calculation (min) (ACUTE ONLY): 20 min  Charges:    $Therapeutic Activity: 8-22 mins PT General Charges $$ ACUTE PT VISIT: 1 Visit                   Lauraine Gills, PTA 06/07/2024, 10:38 AM

## 2024-06-08 NOTE — Progress Notes (Signed)
 " Progress Note   Patient: Anne Oneill FMW:[id number] DOB: [birth year] DOA: 06/02/2024     5 DOS: the patient was seen and examined on 06/08/2024   Brief hospital course: Anne Oneill is a 89 y.o. female with medical history significant of dementia, fall, known C1-C2 fx on C-collar, HTN, HLD,TIA, DVT (off Eliquis  per her daughter), thrombocytopenia, meningioma, breast cancer, under hospice care, who presents with multiple falls.  CT scan showed known C1 fracture, but slight interval increase in displacement.  Chronic compression fracture in T3, acute on chronic T4 compression fracture, acute compression fracture of L4.  Mild displaced fracture of the right pubic bone.     Principal Problem:   Multiple falls Active Problems:   Closed cervical spine fracture (HCC)   Closed compression fracture of L4 vertebra (HCC)   Compression fracture of T3 vertebra (HCC)   HTN (hypertension)   History of DVT (deep vein thrombosis)   Mixed Alzheimer's and vascular dementia (HCC)   Assessment and Plan: Frequent falls. Worsening C1 compression fracture with old fracture. Acute on chronic T4 compression fracture, Chronic compression fracture of T3. Acute compression fracture of L4. Mild displaced right pubic bone fracture. Patient had multiple fractures from the fall, she had a frequent falls in the past due to advanced age and dementia. Discussed with patient daughter, family does not want any intervention.  Therefore, no need for neurosurgical consult. Will treat symptomatically.  PT/OT recommended nursing home placement. Patient has been placed on neck collar, but she has poor compliance. Continue to follow, patient need placement. Per discussion with daughter, we will not accelerate treatment.  She is still hospice appropriate. Per family request, patient can be off neck collar while in bed as we are leaning towards comfort. Patient doing well, currently pending nursing home  placement. Patient still has some pain, will treated with reduced the pain medicine.   Mixed Alzheimer's and vascular dementia. Patient has acquired advanced dementia, no delirium.   Patient has been sleeping well at night, no agitation or delirium.   Essential hypertension. Continue some home medicines.   History of DVT. Patient not currently on anticoagulation.   Discussed with TOC, still working on placement.        Subjective:  Patient doing well, pain under control.  Slept well last night  Physical Exam: Vitals:   06/07/24 1954 06/07/24 1954 06/08/24 0404 06/08/24 0721  BP: 113/66 113/66 (!) 128/58 135/66  Pulse: 77 78 70 74  Resp: 16 16 16 16   Temp: 99 F (37.2 C) 99 F (37.2 C) 98.2 F (36.8 C) 98.1 F (36.7 C)  TempSrc:    Oral  SpO2: 97% 96% 98% 100%  Weight:      Height:       General exam: Appears calm and comfortable  Respiratory system: Clear to auscultation. Respiratory effort normal. Cardiovascular system: S1 & S2 heard, RRR. No JVD, murmurs, rubs, gallops or clicks. No pedal edema. Gastrointestinal system: Abdomen is nondistended, soft and nontender. No organomegaly or masses felt. Normal bowel sounds heard. Central nervous system: Alert and oriented x2. No focal neurological deficits. Extremities: Symmetric 5 x 5 power. Skin: No rashes, lesions or ulcers Psychiatry: Judgement and insight appear normal. Mood & affect appropriate.    Data Reviewed:  There are no new results to review at this time.  Family Communication: None  Disposition: Status is: Inpatient Remains inpatient appropriate because: Unsafe discharge pending placement     Time spent: 25 minutes  Author: Murvin  Laurita, MD 06/08/2024 12:37 PM  For on call review [email or web address]    "

## 2024-06-08 NOTE — TOC Progression Note (Signed)
 Transition of Care [facility]) - Progression Note    Patient Details  Name: Anne Oneill MRN: [id number] Date of Birth: 04/13/1927  [facility] CM/SW Contact  Nathanael CHRISTELLA Ring, RN Phone Number: 06/08/2024, 1:48 PM  Clinical Narrative:     CM met with daughter at the bedside today, patient sitting up in a chair.  Daughter reports that they do want to do short term rehab and then transition to LTC, she is aware that she will need to revoke hospice.   [facility] told me last week that they had a LTC bed but that it would be easiest to go STR to LTC.  Darrian with [facility] and talk with the daughter about the financials.  Daughter is agreeable to [facility].   Marinell Nova with [facility] confirms that she signed papers revoking hospice services.   Expected Discharge Plan: Skilled Nursing Facility Barriers to Discharge: Continued Medical Work up               Expected Discharge Plan and Services   Discharge Planning Services: CM Consult Post Acute Care Choice: Skilled Nursing Facility Living arrangements for the past 2 months: Assisted Living Facility                 DME Arranged: N/A                     Social Drivers of Health (SDOH) Interventions SDOH Screenings   Food Insecurity: No Food Insecurity (06/02/2024)  Housing: Low Risk (06/02/2024)  Transportation Needs: No Transportation Needs (06/02/2024)  Utilities: Not At Risk (06/02/2024)  Financial Resource Strain: Low Risk  (03/24/2024)   Received from [facility]  Social Connections: Socially Isolated (06/02/2024)  Tobacco Use: Low Risk (06/02/2024)    Readmission Risk Interventions     No data to display

## 2024-06-08 NOTE — Progress Notes (Signed)
 Physical Therapy Treatment Patient Details Name: Anne Oneill MRN: [id number] DOB: April 05, 1927 Today's Date: 06/08/2024   History of Present Illness 89 y/o female presented to ED on 06/02/24 for multiple falls. Sustained L4 and T3 compression fractures. Treating non-operatively. PMH: dementia, known C1-2 fx on C-collar, HTN, DVT, TIA, meningioma, breast cancer    PT Comments  Co-tx with OT for improved outcomes today.  She is able to get to EOB with min a x 2.  Good effort but does need helps to get hips to EOB fully.  Steady in sitting today and not leaning L today.  She is motivated to get to [facility] where she is able to void and participate in ADL task with OT  She agrees to out of chair and remains up with needs met.  She overall does well with transfer when given time and +2 support.  Discussed with nursing staff.   If plan is discharge home, recommend the following: A lot of help with bathing/dressing/bathroom;Assistance with cooking/housework;Assistance with feeding;Direct supervision/assist for medications management;Direct supervision/assist for financial management;Assist for transportation;Help with stairs or ramp for entrance;Supervision due to cognitive status;Two people to help with walking and/or transfers   Can travel by private vehicle        Equipment Recommendations       Recommendations for Other Services       Precautions / Restrictions Precautions Precautions: Fall Recall of Precautions/Restrictions: Impaired Required Braces or Orthoses: Cervical Brace (LSO for comfort) Cervical Brace: Hard collar Restrictions Weight Bearing Restrictions Per Provider Order: No     Mobility  Bed Mobility Overal bed mobility: Needs Assistance Bed Mobility: Supine to Sit   Sidelying to sit: Min assist, +2 for physical assistance         Patient Response: Cooperative  Transfers Overall transfer level: Needs assistance Equipment used: 2 person hand held assist Transfers:  Sit to/from Stand, Bed to chair/wheelchair/BSC Sit to Stand: +2 physical assistance, +2 safety/equipment, Min assist   Step pivot transfers: Min assist, +2 safety/equipment, +2 physical assistance       General transfer comment: cues for hand and feet placement, technique requiring increased time to perform    Ambulation/Gait                   Stairs             Wheelchair Mobility     Tilt Bed Tilt Bed Patient Response: Cooperative  Modified Rankin (Stroke Patients Only)       Balance Overall balance assessment: Needs assistance Sitting-balance support: Bilateral upper extremity supported, Feet supported Sitting balance-Leahy Scale: Fair     Standing balance support: Bilateral upper extremity supported, During functional activity Standing balance-Leahy Scale: Fair Standing balance comment: HHA x2 for safety                            Communication Communication Communication: Impaired Factors Affecting Communication: Hearing impaired  Cognition Arousal: Alert Behavior During Therapy: WFL for tasks assessed/performed   PT - Cognitive impairments: History of cognitive impairments                         Following commands: Intact, Impaired Following commands impaired: Follows one step commands with increased time, Follows multi-step commands inconsistently    Cueing Cueing Techniques: Verbal cues, Tactile cues, Visual cues  Exercises      General Comments General comments (skin integrity, edema, etc.): applied  neck brace in long sitting, left seated in recliner with it on upon exit      Pertinent Vitals/Pain Pain Assessment Pain Assessment: Faces Faces Pain Scale: No hurt Pain Location: seems generally comfortable today Pain Intervention(s): Monitored during session    Home Living                          Prior Function            PT Goals (current goals can now be found in the care plan section)  Progress towards PT goals: Progressing toward goals    Frequency    Min 1X/week      PT Plan      Co-evaluation PT/OT/SLP Co-Evaluation/Treatment: Yes Reason for Co-Treatment: For patient/therapist safety;To address functional/ADL transfers PT goals addressed during session: Mobility/safety with mobility OT goals addressed during session: ADL's and self-care      AM-PAC PT 6 Clicks Mobility   Outcome Measure  Help needed turning from your back to your side while in a flat bed without using bedrails?: A Lot Help needed moving from lying on your back to sitting on the side of a flat bed without using bedrails?: A Lot Help needed moving to and from a bed to a chair (including a wheelchair)?: A Lot Help needed standing up from a chair using your arms (e.g., wheelchair or bedside chair)?: A Lot Help needed to walk in hospital room?: Total Help needed climbing 3-5 steps with a railing? : Total 6 Click Score: 10    End of Session Equipment Utilized During Treatment: Cervical collar Activity Tolerance: Patient tolerated treatment well Patient left: with call bell/phone within reach;in chair;with chair alarm set Nurse Communication: Mobility status PT Visit Diagnosis: Repeated falls (R29.6);Muscle weakness (generalized) (M62.81);Other abnormalities of gait and mobility (R26.89);Unsteadiness on feet (R26.81);History of falling (Z91.81);Difficulty in walking, not elsewhere classified (R26.2)     Time: 8954-8891 PT Time Calculation (min) (ACUTE ONLY): 23 min  Charges:    $Therapeutic Activity: 8-22 mins PT General Charges $$ ACUTE PT VISIT: 1 Visit                   Lauraine Gills, PTA 06/08/2024, 1:14 PM

## 2024-06-08 NOTE — Progress Notes (Signed)
 Occupational Therapy Treatment Patient Details Name: Anne Oneill MRN: [id number] DOB: August 22, 1926 Today's Date: 06/08/2024   History of present illness 89 y/o female presented to ED on 06/02/24 for multiple falls. Sustained L4 and T3 compression fractures. Treating non-operatively. PMH: dementia, known C1-2 fx on C-collar, HTN, DVT, TIA, meningioma, breast cancer   OT comments  Pt is supine in bed on arrival. Pleasant and agreeable to OT/PT co-tx session. She does not complain of pain during session and denies when asked. Pt performed bed mobility, STS, and SPT from bed>BSC and BSC>recliner with Min A +2, cues for hand/feet placement and sequencing/technique. C-collar donned in bed prior to transfers. ADL session performed with pt required Mod A for LB ADL management and supervision for UB ADLs with cues for initiation and finishing tasks (washed arms 2-3x). She is pleasantly confused, but cooperative throughout session. Left seated in recliner with all needs in place and will cont to require skilled acute OT services to maximize her safety and IND to return to PLOF.       If plan is discharge home, recommend the following:  A lot of help with walking and/or transfers;A lot of help with bathing/dressing/bathroom   Equipment Recommendations  Other (comment) (defer)    Recommendations for Other Services      Precautions / Restrictions Precautions Precautions: Fall Recall of Precautions/Restrictions: Impaired Required Braces or Orthoses: Cervical Brace (LSO for comfort) Cervical Brace: Hard collar Restrictions Weight Bearing Restrictions Per Provider Order: No       Mobility Bed Mobility Overal bed mobility: Needs Assistance Bed Mobility: Supine to Sit, Sit to Supine     Supine to sit: Min assist, +2 for safety/equipment, +2 for physical assistance     General bed mobility comments: increased time, use of chux pad, was already in long sitting before moving to edge     Transfers Overall transfer level: Needs assistance Equipment used: None Transfers: Sit to/from Stand, Bed to chair/wheelchair/BSC Sit to Stand: +2 physical assistance, +2 safety/equipment, Min assist     Step pivot transfers: Min assist, +2 safety/equipment, +2 physical assistance     General transfer comment: cues for hand and feet placement, technique requiring increased time to perform     Balance Overall balance assessment: Needs assistance Sitting-balance support: Bilateral upper extremity supported, Feet supported Sitting balance-Leahy Scale: Fair Sitting balance - Comments: improved seated balance today, no LOB while seated on toilet with SBA   Standing balance support: Bilateral upper extremity supported, During functional activity Standing balance-Leahy Scale: Fair Standing balance comment: HHA x2 for safety                           ADL either performed or assessed with clinical judgement   ADL Overall ADL's : Needs assistance/impaired     Grooming: Wash/dry face;Set up;Sitting   Upper Body Bathing: Supervision/ safety;Sitting Upper Body Bathing Details (indicate cue type and reason): cues for initiation and pt repetitively washing BUEs multiple times Lower Body Bathing: Moderate assistance;Sit to/from stand;Sitting/lateral leans Lower Body Bathing Details (indicate cue type and reason): able to bathe bil thighs, requires assist buttocks and did not remove ted hose to bathe below the knee, but anticipate assist required d/t back fractures Upper Body Dressing : Moderate assistance;Sitting Upper Body Dressing Details (indicate cue type and reason): change out gowns     Toilet Transfer: Minimal assistance;BSC/3in1;Stand-pivot;Cueing for sequencing;+2 for safety/equipment;+2 for physical assistance   Toileting- Clothing Manipulation and Hygiene: Maximal  assistance;Sit to/from stand       Functional mobility during ADLs: Minimal assistance;Rolling  walker (2 wheels);+2 for safety/equipment;+2 for physical assistance      Extremity/Trunk Assessment              Vision       Perception     Praxis     Communication Communication Communication: Impaired Factors Affecting Communication: Hearing impaired   Cognition Arousal: Alert Behavior During Therapy: WFL for tasks assessed/performed                                 Following commands: Intact, Impaired Following commands impaired: Follows one step commands with increased time, Follows multi-step commands inconsistently      Cueing   Cueing Techniques: Verbal cues, Tactile cues, Visual cues  Exercises      Shoulder Instructions       General Comments applied neck brace in long sitting, left seated in recliner with it on upon exit    Pertinent Vitals/ Pain       Pain Assessment Pain Assessment: Faces Faces Pain Scale: No hurt Pain Intervention(s): Limited activity within patient's tolerance, Monitored during session  Home Living                                          Prior Functioning/Environment              Frequency  Min 2X/week        Progress Toward Goals  OT Goals(current goals can now be found in the care plan section)  Progress towards OT goals: Progressing toward goals  Acute Rehab OT Goals OT Goal Formulation: Patient unable to participate in goal setting Time For Goal Achievement: 06/17/24 Potential to Achieve Goals: Fair  Plan      Co-evaluation                 AM-PAC OT 6 Clicks Daily Activity     Outcome Measure   Help from another person eating meals?: A Little Help from another person taking care of personal grooming?: A Lot Help from another person toileting, which includes using toliet, bedpan, or urinal?: A Lot Help from another person bathing (including washing, rinsing, drying)?: A Lot Help from another person to put on and taking off regular upper body clothing?: A  Lot Help from another person to put on and taking off regular lower body clothing?: A Lot 6 Click Score: 13    End of Session Equipment Utilized During Treatment: Cervical collar  OT Visit Diagnosis: Other abnormalities of gait and mobility (R26.89);Muscle weakness (generalized) (M62.81);Pain;Repeated falls (R29.6)   Activity Tolerance Patient tolerated treatment well   Patient Left with call bell/phone within reach;in chair;with chair alarm set   Nurse Communication Mobility status        Time: 1047-1110 OT Time Calculation (min): 23 min  Charges: OT General Charges $OT Visit: 1 Visit OT Treatments $Self Care/Home Management : 8-22 mins  Anne Oneill, OTR/L  06/08/2024, 12:32 PM   Telissa Palmisano E Oneill 06/08/2024, 12:29 PM

## 2024-06-08 NOTE — Progress Notes (Signed)
 [facility] Room 106 [facility] Liaison Note  Patient revoked her Hospice Medicare Benefit today to pursue SNF rehab.  [facility] will follow patient with outpatient palliative care services at the facility.   Please call with any hospice or palliative care related questions or concerns.   [facility] Liaision [phone]

## 2024-06-08 NOTE — Telephone Encounter (Signed)
 Dr. [name] reviewed her cervical imaging. Fracture displacement is getting slightly worse as expected with a nonhealing fracture.   He recommends she wear her collar anytime she is up and out of bed but does not need it when she is in bed.

## 2024-06-08 NOTE — Plan of Care (Signed)
 SABRA

## 2024-06-09 NOTE — Progress Notes (Signed)
" ° °  Brief Progress Note   _____________________________________________________________________________________________________________  Patient Name: Anne Oneill Patient DOB: 02-12-1927 Date: @TODAY @   Pt ready for d/c but due to holiday and pt going to SNF may not go home till 06/15/2024 _____________________________________________________________________________________________________________  [facility] RN Expeditor Ronal DELENA Bald Please contact us  directly via secure chat (search for [facility]) or by calling us  at [phone] [facility]).  "

## 2024-06-09 NOTE — Progress Notes (Signed)
 " Progress Note   Patient: Anne Oneill FMW:[id number] DOB: 1926-12-30 DOA: 06/02/2024     6 DOS: the patient was seen and examined on 06/09/2024   Brief hospital course: Anne Oneill is a 89 y.o. female with medical history significant of dementia, fall, known C1-C2 fx on C-collar, HTN, HLD,TIA, DVT (off Eliquis  per her daughter), thrombocytopenia, meningioma, breast cancer, under hospice care, who presents with multiple falls.  CT scan showed known C1 fracture, but slight interval increase in displacement.  Chronic compression fracture in T3, acute on chronic T4 compression fracture, acute compression fracture of L4.  Mild displaced fracture of the right pubic bone.     Principal Problem:   Multiple falls Active Problems:   Closed cervical spine fracture (HCC)   Closed compression fracture of L4 vertebra (HCC)   Compression fracture of T3 vertebra (HCC)   HTN (hypertension)   History of DVT (deep vein thrombosis)   Mixed Alzheimer's and vascular dementia (HCC)   Assessment and Plan: Frequent falls. Worsening C1 compression fracture with old fracture. Acute on chronic T4 compression fracture, Chronic compression fracture of T3. Acute compression fracture of L4. Mild displaced right pubic bone fracture. Patient had multiple fractures from the fall, she had a frequent falls in the past due to advanced age and dementia. Discussed with patient daughter, family does not want any intervention.  Therefore, no need for neurosurgical consult. Will treat symptomatically.  PT/OT recommended nursing home placement. Patient has been placed on neck collar, but she has poor compliance. Continue to follow, patient need placement. Per discussion with daughter, we will not accelerate treatment.  She is still hospice appropriate. Per family request, patient can be off neck collar while in bed as we are leaning towards comfort. However, family had made decision to transition to SNF with  rehab.  Discussed with TOC, due to holiday and weakness schedule, probably can be transferred to SNF on Monday.    Mixed Alzheimer's and vascular dementia. Patient has acquired advanced dementia, no delirium.   Patient has been sleeping well at night, no agitation or delirium.   Essential hypertension. Continue some home medicines.   History of DVT. Patient not currently on anticoagulation.       Subjective:  Patient doing well today, no significant pain.  Physical Exam: Vitals:   06/08/24 1610 06/08/24 2242 06/09/24 0527 06/09/24 0924  BP: (!) 141/65 (!) 121/52 138/64 (!) 147/67  Pulse: 80 72 71 72  Resp: 16 17 17 18   Temp: 98.5 F (36.9 C) 97.8 F (36.6 C) 97.6 F (36.4 C) 98.4 F (36.9 C)  TempSrc: Oral   Oral  SpO2: 100% 96% 99% 99%  Weight:      Height:       General exam: Appears calm and comfortable  Respiratory system: Clear to auscultation. Respiratory effort normal. Cardiovascular system: S1 & S2 heard, RRR. No JVD, murmurs, rubs, gallops or clicks. No pedal edema. Gastrointestinal system: Abdomen is nondistended, soft and nontender. No organomegaly or masses felt. Normal bowel sounds heard. Central nervous system: Alert and oriented x2. No focal neurological deficits. Extremities: Symmetric 5 x 5 power. Skin: No rashes, lesions or ulcers Psychiatry: Mood & affect appropriate.    Data Reviewed:  There are no new results to review at this time.  Family Communication: None  Disposition: Status is: Inpatient Remains inpatient appropriate because: Unsafe discharge, pending placement.     Time spent: 35 minutes  Author: Murvin Mana, MD 06/09/2024 12:50 PM  For  on call review [email or web address]    "

## 2024-06-09 NOTE — Plan of Care (Signed)
 SABRA

## 2024-06-09 NOTE — Plan of Care (Signed)
  Problem: Health Behavior/Discharge Planning: Goal: Ability to manage health-related needs will improve Outcome: Progressing   Problem: Clinical Measurements: Goal: Ability to maintain clinical measurements within normal limits will improve Outcome: Progressing Goal: Will remain free from infection Outcome: Progressing Goal: Respiratory complications will improve Outcome: Progressing Goal: Cardiovascular complication will be avoided Outcome: Progressing   Problem: Activity: Goal: Risk for activity intolerance will decrease Outcome: Progressing   Problem: Nutrition: Goal: Adequate nutrition will be maintained Outcome: Progressing   Problem: Coping: Goal: Level of anxiety will decrease Outcome: Progressing   Problem: Elimination: Goal: Will not experience complications related to bowel motility Outcome: Progressing Goal: Will not experience complications related to urinary retention Outcome: Progressing   Problem: Pain Managment: Goal: General experience of comfort will improve and/or be controlled Outcome: Progressing   Problem: Safety: Goal: Ability to remain free from injury will improve Outcome: Progressing   Problem: Skin Integrity: Goal: Risk for impaired skin integrity will decrease Outcome: Progressing

## 2024-06-10 DIAGNOSIS — R296 Repeated falls: Secondary | ICD-10-CM | POA: Diagnosis not present

## 2024-06-10 NOTE — Progress Notes (Signed)
 Occupational Therapy Treatment Patient Details Name: Anne Oneill MRN: [id number] DOB: 1926/08/25 Today's Date: 06/10/2024   History of present illness 89 y/o female presented to ED on 06/02/24 for multiple falls. Sustained L4 and T3 compression fractures. Treating non-operatively. PMH: dementia, known C1-2 fx on C-collar, HTN, DVT, TIA, meningioma, breast cancer   OT comments  Anne Oneill was seen for OT/PT co-treatment on this date. Upon arrival to room pt supine in bed, soft collar donned, agreeable to tx session. OT facilitated ADL management with education and assistance as described below. See ADL section for additional details regarding occupational performance. Pt continues to be functionally limited by generalized weakness, decreased activity tolerance, limited safety awareness, and poor balance. Pt is progressing toward OT goals and continues to benefit from skilled OT services to maximize return to PLOF and minimize risk of future falls, injury, and readmission. Will continue to follow POC as written. Discharge recommendation remains appropriate.        If plan is discharge home, recommend the following:  A lot of help with walking and/or transfers;A lot of help with bathing/dressing/bathroom   Equipment Recommendations  Other (comment) (defer)    Recommendations for Other Services      Precautions / Restrictions Precautions Precautions: Fall Recall of Precautions/Restrictions: Impaired Required Braces or Orthoses: Cervical Brace (LSO for comfort) Cervical Brace: Hard collar (per secure chat hard cervical collar when OOB) Restrictions Weight Bearing Restrictions Per Provider Order: No       Mobility Bed Mobility Overal bed mobility: Needs Assistance Bed Mobility: Rolling, Sidelying to Sit Rolling: Max assist, Used rails Sidelying to sit: Max assist Supine to sit: Min assist, +2 for safety/equipment, +2 for physical assistance Sit to supine: Max assist, +2 for  physical assistance        Transfers Overall transfer level: Needs assistance Equipment used: 2 person hand held assist, 1 person hand held assist Transfers: Sit to/from Stand, Bed to chair/wheelchair/BSC Sit to Stand: +2 physical assistance, +2 safety/equipment, Max assist Stand pivot transfers: Max assist   Step pivot transfers: Max assist     General transfer comment: maxA with one assist to stand pivot/step pivot to/from [facility].     Balance Overall balance assessment: Needs assistance Sitting-balance support: Bilateral upper extremity supported, Feet supported Sitting balance-Leahy Scale: Fair Sitting balance - Comments: improved seated balance today, no LOB while seated on toilet with SBA   Standing balance support: Bilateral upper extremity supported, During functional activity Standing balance-Leahy Scale: Poor Standing balance comment: HHA x2 for safety                           ADL either performed or assessed with clinical judgement   ADL Overall ADL's : Needs assistance/impaired     Grooming: Wash/dry face;Set up;Sitting                   Toilet Transfer: Stand-pivot;BSC/3in1;Rolling walker (2 wheels);Maximal assistance   Toileting- Clothing Manipulation and Hygiene: Maximal assistance;Sit to/from stand       Functional mobility during ADLs: Maximal assistance;+2 for physical assistance General ADL Comments: +1-2 HHA for functional transfers. Able to take a few steps forward from recliner after toileting. Benefits from chair follow for safety during longer distance transfers.    Extremity/Trunk Assessment Upper Extremity Assessment Upper Extremity Assessment: Generalized weakness   Lower Extremity Assessment Lower Extremity Assessment: Generalized weakness   Cervical / Trunk Assessment Cervical / Trunk Assessment: Other exceptions Cervical /  Trunk Exceptions: C1-2 fx with C-collar, L4 compression fx    Vision       Perception      Praxis     Communication Communication Communication: Impaired Factors Affecting Communication: Hearing impaired   Cognition Arousal: Alert   Cognition: History of cognitive impairments             OT - Cognition Comments: Follows VCs with some delay but generally able to engage in session well.                 Following commands: Impaired Following commands impaired: Follows one step commands with increased time      Cueing   Cueing Techniques: Verbal cues, Tactile cues, Visual cues  Exercises Other Exercises Other Exercises: Pt educated on safety, falls prevention, and precautions t/o session. Limited evidence of learning 2/2 baseline cognitive deficits. OT also facilitated ADL management with assist as described below.    Shoulder Instructions       General Comments      Pertinent Vitals/ Pain       Pain Assessment Pain Assessment: Faces Faces Pain Scale: No hurt  Home Living                                          Prior Functioning/Environment              Frequency  Min 2X/week        Progress Toward Goals  OT Goals(current goals can now be found in the care plan section)  Progress towards OT goals: Progressing toward goals  Acute Rehab OT Goals OT Goal Formulation: Patient unable to participate in goal setting Time For Goal Achievement: 06/17/24 Potential to Achieve Goals: Fair  Plan      Co-evaluation    PT/OT/SLP Co-Evaluation/Treatment: Yes Reason for Co-Treatment: For patient/therapist safety;To address functional/ADL transfers PT goals addressed during session: Mobility/safety with mobility OT goals addressed during session: ADL's and self-care      AM-PAC OT 6 Clicks Daily Activity     Outcome Measure   Help from another person eating meals?: A Little Help from another person taking care of personal grooming?: A Lot Help from another person toileting, which includes using toliet, bedpan, or  urinal?: A Lot Help from another person bathing (including washing, rinsing, drying)?: A Lot Help from another person to put on and taking off regular upper body clothing?: A Lot Help from another person to put on and taking off regular lower body clothing?: A Lot 6 Click Score: 13    End of Session Equipment Utilized During Treatment: Cervical collar  OT Visit Diagnosis: Other abnormalities of gait and mobility (R26.89);Muscle weakness (generalized) (M62.81);Pain;Repeated falls (R29.6)   Activity Tolerance Patient tolerated treatment well   Patient Left with call bell/phone within reach;in chair;with chair alarm set   Nurse Communication Mobility status        Time: [phone] OT Time Calculation (min): 31 min  Charges: OT General Charges $OT Visit: 1 Visit OT Treatments $Self Care/Home Management : 8-22 mins  Jhonny Pelton, M.S., OTR/L 06/10/2024, 12:34 PM

## 2024-06-10 NOTE — Care Management Important Message (Signed)
 Important Message  Patient Details  Name: QUIANNA AVERY MRN: [id number] Date of Birth: [birth year]   Important Message Given:  Yes - Medicare IM     Sala Tague W, CMA 06/10/2024, 10:56 AM

## 2024-06-10 NOTE — Progress Notes (Signed)
" °  Progress Note   Patient: Anne Oneill DOB: [birth year] DOA: 06/02/2024     7 DOS: the patient was seen and examined on 06/10/2024   Brief hospital course: Anne Oneill is a 89 y.o. female with medical history significant of dementia, fall, known C1-C2 fx on C-collar, HTN, HLD,TIA, DVT (off Eliquis  per her daughter), thrombocytopenia, meningioma, breast cancer, under hospice care, who presents with multiple falls.  CT scan showed known C1 fracture, but slight interval increase in displacement.  Chronic compression fracture in T3, acute on chronic T4 compression fracture, acute compression fracture of L4.  Mild displaced fracture of the right pubic bone.     Principal Problem:   Multiple falls Active Problems:   Closed cervical spine fracture (HCC)   Closed compression fracture of L4 vertebra (HCC)   Compression fracture of T3 vertebra (HCC)   HTN (hypertension)   History of DVT (deep vein thrombosis)   Mixed Alzheimer's and vascular dementia (HCC)   Assessment and Plan: Frequent falls. Worsening C1 compression fracture with old fracture. Acute on chronic T4 compression fracture, Chronic compression fracture of T3. Acute compression fracture of L4. Mild displaced right pubic bone fracture. Patient had multiple fractures from the fall, she had a frequent falls in the past due to advanced age and dementia. Discussed with patient daughter, family does not want any intervention.  Therefore, no need for neurosurgical consult. Will treat symptomatically.  PT/OT recommended nursing home placement. Patient has been placed on neck collar, but she has poor compliance. Continue to follow, patient need placement. Per discussion with daughter, we will not accelerate treatment.  She is still hospice appropriate. Per family request, patient can be off neck collar while in bed as we are leaning towards comfort. However, family had made decision to transition to SNF with  rehab.  Discussed with TOC, due to holiday and weakness schedule, probably can be transferred to SNF on Monday.    Mixed Alzheimer's and vascular dementia. Patient has acquired advanced dementia, no delirium.   Patient has been sleeping well at night, no agitation or delirium.   Essential hypertension. Continue some home medicines.   History of DVT. Patient not currently on anticoagulation.      Subjective:  No significant events overnight. Patient was resting fully, denied any pain.   Physical Exam: Vitals:   06/09/24 2153 06/10/24 0453 06/10/24 0744 06/10/24 1610  BP: 133/60 (!) 142/69 136/68 132/63  Pulse: 69 78 89 72  Resp: 16 18 18 16   Temp: 97.7 F (36.5 C) 98.2 F (36.8 C) 98.6 F (37 C) 98.6 F (37 C)  TempSrc: Axillary Oral Oral Oral  SpO2: 100% 96% 95% 95%  Weight:      Height:       General exam: Appears calm and comfortable  Respiratory system: Clear to auscultation. Respiratory effort normal. Cardiovascular system: S1 & S2 heard, RRR. No murmurs Gastrointestinal system: Abdomen is nondistended, soft and nontender. Normal bowel sounds heard. Central nervous system: Alert and oriented x2. No focal neurological deficits. Extremities: No edema  Skin: No rashes, lesions or ulcers Psychiatry: Mood & affect appropriate.    Data Reviewed:  There are no new results to review at this time.  Family Communication: None  Disposition: Status is: Inpatient Remains inpatient appropriate because: Unsafe discharge, pending placement.     Time spent: 35 minutes  Author: Elvan Sor, MD 06/10/2024 4:11 PM  For on call review [email or web address].    "

## 2024-06-10 NOTE — Progress Notes (Signed)
 Physical Therapy Treatment Patient Details Name: Anne Oneill MRN: [id number] DOB: 1926-07-09 Today's Date: 06/10/2024   History of Present Illness 89 y/o female presented to ED on 06/02/24 for multiple falls. Sustained L4 and T3 compression fractures. Treating non-operatively. PMH: dementia, known C1-2 fx on C-collar, HTN, DVT, TIA, meningioma, breast cancer    PT Comments  Patient with OT at bedside, awake, oriented to self, participatory. Able to follow one step commands with increased time/repetition as needed. Rolling and sidelying to sit with maxA. totalA for pericare in sidelying due to BM. Fair sitting balance noted. Sit <> stand and stand pivot from multiple surfaces, maxA. Pt able to take a few steps forwards this session with maxAx1-2 person assist as well. Pt set up for breakfast at end of session. The patient would benefit from further skilled PT intervention to continue to progress towards goals.    If plan is discharge home, recommend the following: A lot of help with bathing/dressing/bathroom;Assistance with cooking/housework;Assistance with feeding;Direct supervision/assist for medications management;Direct supervision/assist for financial management;Assist for transportation;Help with stairs or ramp for entrance;Supervision due to cognitive status;Two people to help with walking and/or transfers   Can travel by private vehicle     No  Equipment Recommendations  Other (comment) (TBD)    Recommendations for Other Services       Precautions / Restrictions Precautions Precautions: Fall Recall of Precautions/Restrictions: Impaired Required Braces or Orthoses: Cervical Brace (LSO for comfort) Cervical Brace: Hard collar Restrictions Weight Bearing Restrictions Per Provider Order: No     Mobility  Bed Mobility Overal bed mobility: Needs Assistance Bed Mobility: Rolling, Sidelying to Sit Rolling: Max assist, Used rails Sidelying to sit: Max assist             Transfers Overall transfer level: Needs assistance Equipment used: 2 person hand held assist, 1 person hand held assist Transfers: Sit to/from Stand, Bed to chair/wheelchair/BSC Sit to Stand: +2 physical assistance, +2 safety/equipment, Max assist Stand pivot transfers: Max assist Step pivot transfers: Max assist       General transfer comment: maxA with one assist to stand pivot/step pivot to/from [facility].    Ambulation/Gait Ambulation/Gait assistance: Max assist, +2 physical assistance Gait Distance (Feet): 3 Feet Assistive device: 2 person hand held assist, 1 person hand held assist             Stairs             Wheelchair Mobility     Tilt Bed    Modified Rankin (Stroke Patients Only)       Balance Overall balance assessment: Needs assistance Sitting-balance support: Bilateral upper extremity supported, Feet supported Sitting balance-Leahy Scale: Fair     Standing balance support: Bilateral upper extremity supported, During functional activity Standing balance-Leahy Scale: Poor                              Communication Communication Communication: Impaired  Cognition Arousal: Alert Behavior During Therapy: WFL for tasks assessed/performed   PT - Cognitive impairments: History of cognitive impairments                         Following commands: Impaired Following commands impaired: Follows one step commands with increased time    Cueing Cueing Techniques: Verbal cues, Tactile cues, Visual cues  Exercises      General Comments        Pertinent Vitals/Pain Pain Assessment  Pain Assessment: Faces Faces Pain Scale: No hurt    Home Living                          Prior Function            PT Goals (current goals can now be found in the care plan section) Progress towards PT goals: Progressing toward goals    Frequency    Min 1X/week      PT Plan      Co-evaluation PT/OT/SLP  Co-Evaluation/Treatment: Yes Reason for Co-Treatment: For patient/therapist safety;To address functional/ADL transfers PT goals addressed during session: Mobility/safety with mobility OT goals addressed during session: ADL's and self-care      AM-PAC PT 6 Clicks Mobility   Outcome Measure  Help needed turning from your back to your side while in a flat bed without using bedrails?: A Lot Help needed moving from lying on your back to sitting on the side of a flat bed without using bedrails?: A Lot Help needed moving to and from a bed to a chair (including a wheelchair)?: A Lot Help needed standing up from a chair using your arms (e.g., wheelchair or bedside chair)?: A Lot Help needed to walk in hospital room?: Total Help needed climbing 3-5 steps with a railing? : Total 6 Click Score: 10    End of Session Equipment Utilized During Treatment: Cervical collar Activity Tolerance: Patient tolerated treatment well Patient left: with call bell/phone within reach;in chair;with chair alarm set Nurse Communication: Mobility status PT Visit Diagnosis: Repeated falls (R29.6);Muscle weakness (generalized) (M62.81);Other abnormalities of gait and mobility (R26.89);Unsteadiness on feet (R26.81);History of falling (Z91.81);Difficulty in walking, not elsewhere classified (R26.2)     Time: 9144-9078 PT Time Calculation (min) (ACUTE ONLY): 26 min  Charges:    $Therapeutic Activity: 8-22 mins PT General Charges $$ ACUTE PT VISIT: 1 Visit                     Doyal Shams PT, DPT 11:22 AM,06/10/2024

## 2024-06-11 DIAGNOSIS — R296 Repeated falls: Secondary | ICD-10-CM | POA: Diagnosis not present

## 2024-06-11 NOTE — Plan of Care (Signed)

## 2024-06-11 NOTE — Progress Notes (Signed)
 " Progress Note   Patient: Anne Oneill FMW:[id number] DOB: 06-17-1926 DOA: 06/02/2024     8 DOS: the patient was seen and examined on 06/11/2024   Brief hospital course: Anne Oneill is a 89 y.o. female with medical history significant of dementia, fall, known C1-C2 fx on C-collar, HTN, HLD,TIA, DVT (off Eliquis  per her daughter), thrombocytopenia, meningioma, breast cancer, under hospice care, who presents with multiple falls.  CT scan showed known C1 fracture, but slight interval increase in displacement.  Chronic compression fracture in T3, acute on chronic T4 compression fracture, acute compression fracture of L4.  Mild displaced fracture of the right pubic bone.     Principal Problem:   Multiple falls Active Problems:   Closed cervical spine fracture (HCC)   Closed compression fracture of L4 vertebra (HCC)   Compression fracture of T3 vertebra (HCC)   HTN (hypertension)   History of DVT (deep vein thrombosis)   Mixed Alzheimer's and vascular dementia (HCC)   Assessment and Plan: Frequent falls. Worsening C1 compression fracture with old fracture. Acute on chronic T4 compression fracture, Chronic compression fracture of T3. Acute compression fracture of L4. Mild displaced right pubic bone fracture. Patient had multiple fractures from the fall, she had a frequent falls in the past due to advanced age and dementia. Discussed with patient daughter, family does not want any intervention.  Therefore, no need for neurosurgical consult. Will treat symptomatically.  PT/OT recommended nursing home placement. Patient has been placed on neck collar, but she has poor compliance. Continue to follow, patient need placement. Per discussion with daughter, we will not accelerate treatment.  She is still hospice appropriate. Per family request, patient can be off neck collar while in bed as we are leaning towards comfort. However, family had made decision to transition to SNF with  rehab.  Discussed with TOC, due to holiday and weakness schedule, probably can be transferred to SNF on Monday.    Mixed Alzheimer's and vascular dementia. Patient has acquired advanced dementia, no delirium.   Patient has been sleeping well at night, no agitation or delirium.   Essential hypertension. Continue some home medicines.   History of DVT. Patient not currently on anticoagulation.      Subjective:  No significant events overnight. Patient was sitting comfortably in the bed.  Denied any pain.    Physical Exam: Vitals:   06/10/24 2001 06/11/24 0529 06/11/24 0758 06/11/24 1618  BP: (!) 115/54 132/71 127/64 (!) 126/53  Pulse: 77 79 72 69  Resp:  16 16 16   Temp: 98.4 F (36.9 C) 97.6 F (36.4 C) 97.8 F (36.6 C) 98.5 F (36.9 C)  TempSrc:      SpO2: 98% 97% 98% 98%  Weight:      Height:       General exam: Appears calm and comfortable  Respiratory system: Clear to auscultation. Respiratory effort normal. Cardiovascular system: S1 & S2 heard, RRR. No murmurs Gastrointestinal system: Abdomen is nondistended, soft and nontender. Normal bowel sounds heard. Central nervous system: Alert and oriented x2. No focal neurological deficits. Extremities: No edema  Skin: No rashes, lesions or ulcers Psychiatry: Mood & affect appropriate.    Data Reviewed:  There are no new results to review at this time.  Family Communication: None  Disposition: Status is: Inpatient Remains inpatient appropriate because: Unsafe discharge, pending placement.     Time spent: 35 minutes  Author: Elvan Sor, MD 06/11/2024 4:46 PM  For on call review [email or web address].    "

## 2024-06-12 DIAGNOSIS — R296 Repeated falls: Secondary | ICD-10-CM | POA: Diagnosis not present

## 2024-06-12 MED ORDER — OXYCODONE HCL 5 MG PO TABS: 5.0000 mg | ORAL_TABLET | Freq: Four times a day (QID) | ORAL | 0 refills | Status: AC | PRN

## 2024-06-12 NOTE — Progress Notes (Signed)
 Occupational Therapy Treatment Patient Details Name: Anne Oneill MRN: [id number] DOB: 08-Apr-1927 Today's Date: 06/12/2024   History of present illness 89 y/o female presented to ED on 06/02/24 for multiple falls. Sustained L4 and T3 compression fractures. Treating non-operatively. PMH: dementia, known C1-2 fx on C-collar, HTN, DVT, TIA, meningioma, breast cancer   OT comments  Pt is supine in bed on arrival. Pleasant and agreeable to OT session. She denies pain. Pt performed bed mobility with Max A with constant cueing for hand placement, technique and hand over hand placement at times. SBA for UB bathing with cues for initiation and Mod/Max A for LB bathing/dressing tasks during session seated/at bed level d/t poor standing balance. Max A +1 to perform ADL transfer of SPT from bed<>BSC during session. Hard collar donned once seated EOB and doffed upon return to supine. Pt returned to bed with all needs in place and will cont to require skilled acute OT services to maximize her safety and IND to return to PLOF.       If plan is discharge home, recommend the following:  A lot of help with walking and/or transfers;A lot of help with bathing/dressing/bathroom   Equipment Recommendations  Other (comment) (defer)    Recommendations for Other Services      Precautions / Restrictions Precautions Precautions: Fall Recall of Precautions/Restrictions: Impaired Precaution/Restrictions Comments: LSO for comfort Required Braces or Orthoses: Cervical Brace (LSO for comfort) Cervical Brace: Hard collar (per secure chat hard cervical collar when OOB) Restrictions Weight Bearing Restrictions Per Provider Order: No       Mobility Bed Mobility Overal bed mobility: Needs Assistance Bed Mobility: Rolling, Sidelying to Sit Rolling: Max assist, Used rails, Independent Sidelying to sit: Max assist   Sit to supine: Max assist   General bed mobility comments: constant cues, increased time, use of  chux pad and hand over hand placement onto bed rails    Transfers Overall transfer level: Needs assistance Equipment used: 1 person hand held assist Transfers: Sit to/from Stand, Bed to chair/wheelchair/BSC Sit to Stand: Max assist Stand pivot transfers: Max assist         General transfer comment: constant cues, BLE blocking, cues for hand placement and technique for SPT from bed<>BSC     Balance Overall balance assessment: Needs assistance Sitting-balance support: Bilateral upper extremity supported, Feet supported Sitting balance-Leahy Scale: Fair     Standing balance support: Bilateral upper extremity supported, During functional activity Standing balance-Leahy Scale: Poor Standing balance comment: consant hands on assist and Max A for SPT                           ADL either performed or assessed with clinical judgement   ADL Overall ADL's : Needs assistance/impaired     Grooming: Wash/dry face;Set up;Sitting Grooming Details (indicate cue type and reason): on EOB Upper Body Bathing: Supervision/ safety;Sitting Upper Body Bathing Details (indicate cue type and reason): seated on BSC with cues for initiation and attention to task Lower Body Bathing: Sit to/from stand;Sitting/lateral leans;Maximal assistance;Bed level;Moderate assistance Lower Body Bathing Details (indicate cue type and reason): able to bathe upper legs, required assist for below the knee and buttocks at bed level d/t poor standing balance Upper Body Dressing : Moderate assistance;Sitting Upper Body Dressing Details (indicate cue type and reason): change out gowns Lower Body Dressing: Maximal assistance;Bed level Lower Body Dressing Details (indicate cue type and reason): donn pull up Toilet Transfer: Stand-pivot;BSC/3in1;Rolling walker (2  wheels);Maximal assistance   Toileting- Clothing Manipulation and Hygiene: Maximal assistance;Bed level;Total assistance Toileting - Clothing Manipulation  Details (indicate cue type and reason): after cont BM on BSC and incont urine            Extremity/Trunk Assessment              Vision       Perception     Praxis     Communication Communication Communication: Impaired Factors Affecting Communication: Hearing impaired   Cognition Arousal: Alert   Cognition: History of cognitive impairments             OT - Cognition Comments: Follows VCs with some delay but generally able to engage in session well.                 Following commands: Impaired Following commands impaired: Follows one step commands with increased time      Cueing   Cueing Techniques: Verbal cues, Tactile cues, Visual cues  Exercises      Shoulder Instructions       General Comments applied neck brace once seated EOB and doffed upon return to supine, no pain reported    Pertinent Vitals/ Pain       Pain Assessment Pain Assessment: No/denies pain Pain Intervention(s): Monitored during session  Home Living                                          Prior Functioning/Environment              Frequency  Min 2X/week        Progress Toward Goals  OT Goals(current goals can now be found in the care plan section)  Progress towards OT goals: Progressing toward goals  Acute Rehab OT Goals OT Goal Formulation: Patient unable to participate in goal setting Time For Goal Achievement: 06/17/24 Potential to Achieve Goals: Fair  Plan      Co-evaluation                 AM-PAC OT 6 Clicks Daily Activity     Outcome Measure   Help from another person eating meals?: A Little Help from another person taking care of personal grooming?: A Lot Help from another person toileting, which includes using toliet, bedpan, or urinal?: A Lot Help from another person bathing (including washing, rinsing, drying)?: A Lot Help from another person to put on and taking off regular upper body clothing?: A Lot Help  from another person to put on and taking off regular lower body clothing?: A Lot 6 Click Score: 13    End of Session Equipment Utilized During Treatment: Cervical collar  OT Visit Diagnosis: Other abnormalities of gait and mobility (R26.89);Muscle weakness (generalized) (M62.81);Repeated falls (R29.6)   Activity Tolerance Patient tolerated treatment well   Patient Left with call bell/phone within reach;in bed;with bed alarm set   Nurse Communication Mobility status        Time: 8857-8793 OT Time Calculation (min): 24 min  Charges: OT General Charges $OT Visit: 1 Visit OT Treatments $Self Care/Home Management : 23-37 mins  Leibish Mcgregor Chrismon, OTR/L  06/12/2024, 12:39 PM   Macdonald Rigor E Chrismon 06/12/2024, 12:37 PM

## 2024-06-12 NOTE — Discharge Summary (Signed)
 [facility] Discharge Summary   Patient: Anne Oneill FMW:[id number]  [facility]: [facility]  Date of admission: 06/02/2024   Date of discharge:  06/12/2024     Discharge Diagnoses:  Principal Problem:   Multiple falls Active Problems:   Closed cervical spine fracture (HCC)   Closed compression fracture of L4 vertebra (HCC)   Compression fracture of T3 vertebra (HCC)   HTN (hypertension)   History of DVT (deep vein thrombosis)   Mixed Alzheimer's and vascular dementia (HCC)   Admitted From: Home Disposition:  SNF   Recommendations for Outpatient Follow-up:  Follow-up with PCP, patient is to be seen by an MD in 1 to 2 days, monitor BP and titrate medication accordingly. F/u Palliative Care with [facility] Follow up LABS/TEST:     Follow-up Information     [facility] Follow up.   Why: Hospital follow up Contact information: [address] [place] [address] [phone]                Diet recommendation: Regular diet  Activity: The patient is advised to gradually reintroduce usual activities, as tolerated  Discharge Condition: stable  Code Status: DNR -Limited  History of present illness: As per the H and P dictated on admission.  Hospital Course:  Anne Oneill is a 89 y.o. female with medical history significant of dementia, fall, known C1-C2 fx on C-collar, HTN, HLD,TIA, DVT (off Eliquis  per her daughter), thrombocytopenia, meningioma, breast cancer, under hospice care, who presents with multiple falls.  CT scan showed known C1 fracture, but slight interval increase in displacement.  Chronic compression fracture in T3, acute on chronic T4 compression fracture, acute compression fracture of L4.  Mild displaced fracture of the right pubic bone.   Assessment and Plan: Frequent falls. Worsening C1 compression fracture with old fracture. Acute on chronic T4 compression fracture, Chronic compression fracture of  T3. Acute compression fracture of L4. Mild displaced right pubic bone fracture. Patient had multiple fractures from the fall, she had a frequent falls in the past due to advanced age and dementia. Discussed with patient daughter, family does not want any intervention.  Therefore, no need for neurosurgical consult. Treat symptomatically.   PT/OT recommended nursing home placement. Patient has been placed on neck collar, but she has poor compliance. Continue to follow, patient need placement. Per discussion with daughter, we will not accelerate treatment.  She is still hospice appropriate. Per family request, patient can be off neck collar while in bed as we are leaning towards comfort. However, family had made decision to transition to SNF with rehab.      Mixed Alzheimer's and vascular dementia. Patient has acquired advanced dementia, no delirium.   Patient has been sleeping well at night, no agitation or delirium.   Essential hypertension. Continue some home medicines.   History of DVT. Patient not currently on anticoagulation.    Body mass index is 23.9 kg/m.  Nutrition Interventions:  Pain control  - [place]  Controlled Substance Reporting System database  could not be reviewed as website was not working - Oxycodone  5 mg PRN, 10 tabs prescribed as per SNF protocol. - Patient was instructed, not to drive, operate heavy machinery, perform activities at heights, swimming or participation in water activities or provide baby sitting services while on Pain, Sleep and Anxiety Medications; until her outpatient Physician has advised to do so again.  - Also recommended to not to take more than prescribed Pain, Sleep and Anxiety Medications.  Patient was seen by physical therapy, who recommended Therapy, SNF placement, which was arranged. On the day of the discharge the patient's vitals were stable, and no other acute medical condition were reported by patient. the patient was felt  safe to be discharge at SNF with Therapy.  Consultants: None Procedures: None  Discharge Exam: General: Appear in no distress, Oral Mucosa Clear, moist. Cardiovascular: S1 and S2 Present, no Murmur, Respiratory: normal respiratory effort, Bilateral Air entry present and no Crackles, no wheezes Abdomen: Bowel Sound present, Soft and no tenderness. Extremities: no Pedal edema, no calf tenderness Neurology: alert and oriented to time, place, and person affect appropriate.  Filed Weights   06/02/24 1409 06/02/24 2154  Weight: 55 kg 55.5 kg   Vitals:   06/12/24 0400 06/12/24 0840  BP: (!) 147/66 119/71  Pulse: 67 66  Resp: 18 18  Temp: 97.8 F (36.6 C) 97.9 F (36.6 C)  SpO2: 96% 98%    DISCHARGE MEDICATION: Allergies as of 06/12/2024       Reactions   Codeine Nausea Only   Ditropan [oxybutynin]    Hydrocodone Nausea Only   Morphine  Nausea Only   Oxybutynin Chloride Other (See Comments)   Latex Rash        Medication List     STOP taking these medications    apixaban  2.5 MG Tabs tablet Commonly known as: ELIQUIS    escitalopram  10 MG tablet Commonly known as: LEXAPRO        TAKE these medications    acetaminophen  325 MG tablet Commonly known as: TYLENOL  Take 2 tablets (650 mg total) by mouth every 6 (six) hours as needed for mild pain (pain score 1-3), fever or headache.   amLODipine  10 MG tablet Commonly known as: NORVASC  Take 1 tablet (10 mg total) by mouth daily.   bisacodyl  10 MG suppository Commonly known as: DULCOLAX Place 1 suppository (10 mg total) rectally daily as needed for moderate constipation (if no results with lactulose ).   hydrALAZINE  50 MG tablet Commonly known as: APRESOLINE  Take 1 tablet (50 mg total) by mouth 3 (three) times daily as needed (SBP >150).   metoprolol  succinate 50 MG 24 hr tablet Commonly known as: TOPROL -XL Take 50 mg by mouth daily.   oxyCODONE  5 MG immediate release tablet Commonly known as: Oxy  IR/ROXICODONE  Take 1 tablet (5 mg total) by mouth every 6 (six) hours as needed for moderate pain (pain score 4-6).   polyethylene glycol 17 g packet Commonly known as: MIRALAX  / GLYCOLAX  Take 17 g by mouth daily as needed.   spironolactone  25 MG tablet Commonly known as: ALDACTONE  Take 37.5 mg by mouth daily.       Allergies[1] Discharge Instructions     Call MD for:  difficulty breathing, headache or visual disturbances   Complete by: As directed    Call MD for:  extreme fatigue   Complete by: As directed    Call MD for:  persistant dizziness or light-headedness   Complete by: As directed    Call MD for:  persistant nausea and vomiting   Complete by: As directed    Call MD for:  severe uncontrolled pain   Complete by: As directed    Call MD for:  temperature >100.4   Complete by: As directed    Discharge instructions   Complete by: As directed    Follow-up with PCP, patient is to be seen by an MD in 1 to 2 days, monitor BP and titrate medication accordingly. F/u  Palliative Care with [facility]   Increase activity slowly   Complete by: As directed        The results of significant diagnostics from this hospitalization (including imaging, microbiology, ancillary and laboratory) are listed below for reference.    Significant Diagnostic Studies: CT Lumbar Spine Wo Contrast Result Date: 06/02/2024 EXAM: CT of the lumbar spine without contrast 06/02/2024 06:17:21 PM TECHNIQUE: CT of the lumbar spine was performed without the administration of intravenous contrast. Multiplanar reformatted images are provided for review. Automated exposure control, iterative reconstruction, and/or weight based adjustment of the mA/kV was utilized to reduce the radiation dose to as low as reasonably achievable. COMPARISON: None available. CLINICAL HISTORY: Back trauma, no prior imaging (Age >= 16y) FINDINGS: BONES AND ALIGNMENT: Subacute or acute aappearing ppearing fracture of L4 with 15%  height loss and 3mm bony retropulsion. Associated sclerosis in the superior endplate as well as nondisplaced lucency/fracture anteriorly. No substantial sagittal subluxation. Osteopenia. DEGENERATIVE CHANGES: Mild to moderate canal stenosis at L4 due to bony retropulsion. Severe lower lumbar facet arthropathy. SOFT TISSUES: No acute abnormality. IMPRESSION: 1. Acute or subacute L4 fracture with 15% height loss and 3mm bony retropulsion. Mild to moderate resulting canal stenosis. Electronically signed by: Gilmore Molt 06/02/2024 08:01 PM EST RP Workstation: [id number]   CT PELVIS WO CONTRAST Result Date: 06/02/2024 CLINICAL DATA:  Hip trauma.  Concern for fracture. EXAM: CT PELVIS WITHOUT CONTRAST TECHNIQUE: Multidetector CT imaging of the pelvis was performed following the standard protocol without intravenous contrast. RADIATION DOSE REDUCTION: This exam was performed according to the departmental dose-optimization program which includes automated exposure control, adjustment of the mA and/or kV according to patient size and/or use of iterative reconstruction technique. COMPARISON:  Right hip radiograph dated 06/02/2024. CT dated 02/23/2022. FINDINGS: Urinary Tract: Partially visualized left renal cyst. The visualized ureters and urinary bladder probable Bowel:  Sigmoid diverticulosis.  No bowel dilatation. Vascular/Lymphatic: Moderate aortoiliac atherosclerotic disease. The IVC is unremarkable. No pelvic adenopathy. Reproductive:  Hysterectomy. Other:  None Musculoskeletal: Total left hip arthroplasty and right femoral ORIF. The hardware is intact. Significant interval healing of the previously seen right pubic bone fractures with near fusion of the fracture of the inferior right pubic ramus and ischial tuberosity. No acute fracture or dislocation. The bones are osteopenic. There is compression fracture of superior endplate of L4 with approximately 20% loss of vertebral body height new since the CT of  02/23/2022, likely acute. Correlation with clinical exam and point tenderness recommended. CT L-spine CT report. IMPRESSION: 1. No acute fracture or dislocation of the pelvis. 2. Acute appearing compression fracture of superior endplate of L4. Correlation with clinical exam and point tenderness recommended. 3. Sigmoid diverticulosis. 4.  Aortic Atherosclerosis (ICD10-I70.0). Electronically Signed   By: Vanetta Chou M.D.   On: 06/02/2024 19:47   DG Hip Unilat W or Wo Pelvis 2-3 Views Right Result Date: 06/02/2024 CLINICAL DATA:  Fall and pain. EXAM: DG HIP (WITH OR WITHOUT PELVIS) 2-3V RIGHT COMPARISON:  Pelvic radiograph dated 02/23/2022. FINDINGS: Total left hip arthroplasty and ORIF of the right femur. The hardware appears intact. Mildly displaced fractures of the right pubic bone with improved alignment compared to prior radiograph. No acute fracture. No dislocation. The bones are osteopenic. The soft tissues are unremarkable. IMPRESSION: 1. No acute fracture or dislocation. 2. Mildly displaced fractures of the right pubic bone with improved alignment compared to prior radiograph. Electronically Signed   By: Vanetta Chou M.D.   On: 06/02/2024 17:08   CT Head  Wo Contrast Result Date: 06/02/2024 CLINICAL DATA:  Unwitnessed fall EXAM: CT HEAD WITHOUT CONTRAST CT CERVICAL SPINE WITHOUT CONTRAST TECHNIQUE: Multidetector CT imaging of the head and cervical spine was performed following the standard protocol without intravenous contrast. Multiplanar CT image reconstructions of the cervical spine were also generated. RADIATION DOSE REDUCTION: This exam was performed according to the departmental dose-optimization program which includes automated exposure control, adjustment of the mA and/or kV according to patient size and/or use of iterative reconstruction technique. COMPARISON:  CT brain and cervical spine 02/11/2024 FINDINGS: CT HEAD FINDINGS Brain: No acute territorial infarction, hemorrhage or  intracranial mass. Atrophy and chronic small vessel ischemic changes of the white matter. Stable ventricle size. Chronic left cerebellar calcification. Stable partially calcified left para fall seen small meningioma. Vascular: No hyperdense vessels.  Carotid vascular calcification Skull: No depressed skull fracture Sinuses/Orbits: No acute finding. Other: None CT CERVICAL SPINE FINDINGS Alignment: Since the most recent prior exam from September, slight increased posterior displacement of the odontoid on sagittal images, measuring about 3 mm, there is chronic type 3 odontoid fracture again noted. Slight interval increase displacement of known left C1 posterior arch fracture, now measuring 2 mm, previously nondisplaced. 3 mm anterolisthesis C4-C5. mild lateral displacement of the left lateral mass by about 2 mm. Skull base and vertebrae: Known fractures of the anterior and posterior arch of C1, some interval osseous bridging across the anterior arch fracture. As stated previously, slight increased displacement of left posterior arch fracture. Known type 3 odontoid fracture, with interval sclerotic margins, slight increased posterior displacement of the odontoid compared to prior, increased fracture gap measuring 5 mm on sagittal series. Chronic compression deformity of T3. No new fracture is seen Soft tissues and spinal canal: No prevertebral fluid or swelling. No visible canal hematoma. Disc levels: Multilevel degenerative changes, most significant C5-C6 and C6-C7. Moderate degenerative change C4-C5. Multilevel hypertrophic facet degenerative changes. Upper chest: Scarring at the apices. Chronic multinodular thyroid  as was seen on prior exams Other: None IMPRESSION: 1. No CT evidence for acute intracranial abnormality. Atrophy and chronic small vessel ischemic changes of the white matter. 2. Known fractures of the anterior and posterior arch of C1, some interval osseous bridging across the anterior arch fracture.  Slight interval increase in displacement of left posterior arch fracture, now measuring 2 mm, previously nondisplaced. Similar mild lateral displacement of the left lateral mass. 3. Known type 3 odontoid fracture, with interval sclerotic margins (ununited), slight increased posterior displacement of the odontoid compared to prior measuring 3 mm, and increased fracture distraction measuring up to 5 mm 4. Chronic compression deformity of T3. No new fracture is seen. Electronically Signed   By: Luke Bun M.D.   On: 06/02/2024 15:35   CT Cervical Spine Wo Contrast Result Date: 06/02/2024 CLINICAL DATA:  Unwitnessed fall EXAM: CT HEAD WITHOUT CONTRAST CT CERVICAL SPINE WITHOUT CONTRAST TECHNIQUE: Multidetector CT imaging of the head and cervical spine was performed following the standard protocol without intravenous contrast. Multiplanar CT image reconstructions of the cervical spine were also generated. RADIATION DOSE REDUCTION: This exam was performed according to the departmental dose-optimization program which includes automated exposure control, adjustment of the mA and/or kV according to patient size and/or use of iterative reconstruction technique. COMPARISON:  CT brain and cervical spine 02/11/2024 FINDINGS: CT HEAD FINDINGS Brain: No acute territorial infarction, hemorrhage or intracranial mass. Atrophy and chronic small vessel ischemic changes of the white matter. Stable ventricle size. Chronic left cerebellar calcification. Stable partially calcified left para  fall seen small meningioma. Vascular: No hyperdense vessels.  Carotid vascular calcification Skull: No depressed skull fracture Sinuses/Orbits: No acute finding. Other: None CT CERVICAL SPINE FINDINGS Alignment: Since the most recent prior exam from September, slight increased posterior displacement of the odontoid on sagittal images, measuring about 3 mm, there is chronic type 3 odontoid fracture again noted. Slight interval increase  displacement of known left C1 posterior arch fracture, now measuring 2 mm, previously nondisplaced. 3 mm anterolisthesis C4-C5. mild lateral displacement of the left lateral mass by about 2 mm. Skull base and vertebrae: Known fractures of the anterior and posterior arch of C1, some interval osseous bridging across the anterior arch fracture. As stated previously, slight increased displacement of left posterior arch fracture. Known type 3 odontoid fracture, with interval sclerotic margins, slight increased posterior displacement of the odontoid compared to prior, increased fracture gap measuring 5 mm on sagittal series. Chronic compression deformity of T3. No new fracture is seen Soft tissues and spinal canal: No prevertebral fluid or swelling. No visible canal hematoma. Disc levels: Multilevel degenerative changes, most significant C5-C6 and C6-C7. Moderate degenerative change C4-C5. Multilevel hypertrophic facet degenerative changes. Upper chest: Scarring at the apices. Chronic multinodular thyroid  as was seen on prior exams Other: None IMPRESSION: 1. No CT evidence for acute intracranial abnormality. Atrophy and chronic small vessel ischemic changes of the white matter. 2. Known fractures of the anterior and posterior arch of C1, some interval osseous bridging across the anterior arch fracture. Slight interval increase in displacement of left posterior arch fracture, now measuring 2 mm, previously nondisplaced. Similar mild lateral displacement of the left lateral mass. 3. Known type 3 odontoid fracture, with interval sclerotic margins (ununited), slight increased posterior displacement of the odontoid compared to prior measuring 3 mm, and increased fracture distraction measuring up to 5 mm 4. Chronic compression deformity of T3. No new fracture is seen. Electronically Signed   By: Luke Bun M.D.   On: 06/02/2024 15:35    Microbiology: No results found for this or any previous visit (from the past 240  hours).   Labs: CBC: No results for input(s): WBC, NEUTROABS, HGB, HCT, MCV, PLT in the last 168 hours. Basic Metabolic Panel: No results for input(s): NA, K, CL, CO2, GLUCOSE, BUN, CREATININE, CALCIUM , MG, PHOS in the last 168 hours. Liver Function Tests: No results for input(s): AST, ALT, ALKPHOS, BILITOT, PROT, ALBUMIN in the last 168 hours. No results for input(s): LIPASE, AMYLASE in the last 168 hours. No results for input(s): AMMONIA in the last 168 hours. Cardiac Enzymes: No results for input(s): CKTOTAL, CKMB, CKMBINDEX, TROPONINI in the last 168 hours. BNP (last 3 results) No results for input(s): BNP in the last 8760 hours. CBG: Recent Labs  Lab 06/07/24 1149  GLUCAP 174*    Time spent: 35 minutes  Signed:  Elvan Sor  [facility]  06/12/2024 10:40 AM      [1]  Allergies Allergen Reactions   Codeine Nausea Only   Ditropan [Oxybutynin]    Hydrocodone Nausea Only   Morphine  Nausea Only   Oxybutynin Chloride Other (See Comments)   Latex Rash

## 2024-06-12 NOTE — TOC Progression Note (Signed)
 Transition of Care [facility]) - Progression Note    Patient Details  Name: Anne Oneill MRN: [id number] Date of Birth: 01-01-1927  [facility] CM/SW Contact  Nathanael CHRISTELLA Ring, RN Phone Number: 06/12/2024, 9:48 AM  Clinical Narrative:    CM confirmed with Darrian at [facility] that patient can come today, daughter updated that patient will discharge to [facility] today and provider updated that patient can go today.    Expected Discharge Plan: Skilled Nursing Facility Barriers to Discharge: Continued Medical Work up               Expected Discharge Plan and Services   Discharge Planning Services: CM Consult Post Acute Care Choice: Skilled Nursing Facility Living arrangements for the past 2 months: Assisted Living Facility                 DME Arranged: N/A                     Social Drivers of Health (SDOH) Interventions SDOH Screenings   Food Insecurity: No Food Insecurity (06/02/2024)  Housing: Low Risk (06/02/2024)  Transportation Needs: No Transportation Needs (06/02/2024)  Utilities: Not At Risk (06/02/2024)  Financial Resource Strain: Low Risk  (03/24/2024)   Received from [facility]  Social Connections: Socially Isolated (06/02/2024)  Tobacco Use: Low Risk (06/02/2024)    Readmission Risk Interventions     No data to display

## 2024-06-12 NOTE — Plan of Care (Signed)

## 2024-06-12 NOTE — TOC Transition Note (Signed)
 Transition of Care [facility]) - Discharge Note   Patient Details  Name: Anne Oneill MRN: [id number] Date of Birth: 1926-11-06  [facility] CM/SW Contact:  Nathanael CHRISTELLA Ring, RN Phone Number: 06/12/2024, 11:37 AM   Clinical Narrative:    Patient is going to [facility] and rehab room 1201, bedside nurse will call report to [phone].  Daughter updated on DC for today.  [facility] has been called and patient is 2nd in line for pick up.    Final next level of care: Skilled Nursing Facility Barriers to Discharge: Barriers Resolved   Patient Goals and CMS Choice Patient states their goals for this hospitalization and ongoing recovery are:: Daughter is not sure if she wants to pursue STR or just go straight to LTC CMS Medicare.gov Compare Post Acute Care list provided to:: Patient Represenative (must comment) Choice offered to / list presented to : Adult Children      Discharge Placement              Patient chooses bed at: [facility] Patient to be transferred to facility by: [facility] Name of family member notified: daughter, [name] Patient and family notified of of transfer: 06/12/24  Discharge Plan and Services Additional resources added to the After Visit Summary for     Discharge Planning Services: CM Consult Post Acute Care Choice: Skilled Nursing Facility          DME Arranged: N/A                    Social Drivers of Health (SDOH) Interventions SDOH Screenings   Food Insecurity: No Food Insecurity (06/02/2024)  Housing: Low Risk (06/02/2024)  Transportation Needs: No Transportation Needs (06/02/2024)  Utilities: Not At Risk (06/02/2024)  Financial Resource Strain: Low Risk  (03/24/2024)   Received from [facility]  Social Connections: Socially Isolated (06/02/2024)  Tobacco Use: Low Risk (06/02/2024)     Readmission Risk Interventions     No data to display

## 2024-06-12 NOTE — Progress Notes (Signed)
 Pt report called to [employer] to [place] all questions answered

## 2024-07-02 ENCOUNTER — Encounter: Payer: Self-pay | Admitting: Orthopedic Surgery

## 2024-07-02 NOTE — Telephone Encounter (Signed)
 Please have her get cervical xrays with flex/ext prior to her visit next week- we had cancelled them and I need them done (sorry). I sent message to patient/daughter.

## 2024-07-03 NOTE — Telephone Encounter (Signed)
 Appointment made for xrays

## 2024-07-06 NOTE — Telephone Encounter (Signed)
 This was addressed in another message

## 2024-07-06 NOTE — Progress Notes (Unsigned)
 "  Referring Physician:  [facility] [address] [place],  [place] [address]  Primary Physician:  [facility]  History of Present Illness: Ms. Jhana Giarratano has a history of advanced dementia, HTN, osteoporosis, breast CA, DVT, TIA, hyperlipidemia.   Seen by Dr. Clois on 02/11/24 for hospital consult for type II odontoid fracture and burst fracture of C1 vertebral body. These fractures were seen in imaging on 02/11/24, but he felt she likely had both fractures after her fall on 01/07/24.   Surgery not recommended due to advanced dementia. She was to wear collar when out of bed. She could remove it to eat and sleep.   She also had left hip bipolar hemiarthroplasty by Dr. Edie on 02/12/24.   Last seen by me on 03/25/24 for her cervical fracture. Xrays showed fracture was not healed. She was to continue with collar when up and out of bed. Could remove to sleep and eat.   Cervical CT scan was ordered in 6 weeks. Prior to having this, she had a fall and had repeat cervical CT on 06/02/24. This should fracture was displacement was slightly worse and that fracture had not healed.   He wanted to see her today with repeat xrays and recommended follow up CT scan in 3 months (from CT on 06/02/24).        She is here for follow up. Her [name] helps with history as needed.   She does not complain of any pain, numbness, or tingling. She has been using walker to ambulate. Currently, she needs to use WC when she goes any further than from bed to bathroom per order at [facility].   Review of Systems:  A 10 point review of systems is negative, except for the pertinent positives and negatives detailed in the HPI.  Past Medical History: Past Medical History:  Diagnosis Date   Arthritis    Cancer (HCC)    Dementia (HCC)    mild   DVT (deep venous thrombosis) (HCC)    High cholesterol    Hyperlipemia    Osteoporosis    Squamous cell carcinoma of skin  02/19/2007   R cheek    Past Surgical History: Past Surgical History:  Procedure Laterality Date   ABDOMINAL HYSTERECTOMY     EYE SURGERY     FRACTURE SURGERY     HIP ARTHROPLASTY Left 02/12/2024   Procedure: HEMIARTHROPLASTY (BIPOLAR) HIP, POSTERIOR APPROACH FOR FRACTURE;  Surgeon: Edie Norleen PARAS, MD;  Location: [facility] ORS;  Service: Orthopedics;  Laterality: Left;   MASTECTOMY Right    SKIN BIOPSY      Allergies: Allergies as of 07/08/2024 - Review Complete 06/04/2024  Allergen Reaction Noted   Codeine Nausea Only 12/11/2017   Ditropan [oxybutynin]  11/23/2019   Hydrocodone Nausea Only 12/11/2017   Morphine  Nausea Only 12/11/2017   Oxybutynin chloride Other (See Comments) 02/18/2024   Latex Rash 12/11/2017    Medications: Outpatient Encounter Medications as of 07/08/2024  Medication Sig   acetaminophen  (TYLENOL ) 325 MG tablet Take 2 tablets (650 mg total) by mouth every 6 (six) hours as needed for mild pain (pain score 1-3), fever or headache.   amLODipine  (NORVASC ) 10 MG tablet Take 1 tablet (10 mg total) by mouth daily. (Patient not taking: Reported on 06/04/2024)   bisacodyl  (DULCOLAX) 10 MG suppository Place 1 suppository (10 mg total) rectally daily as needed for moderate constipation (if no results with lactulose ).   hydrALAZINE  (APRESOLINE ) 50 MG tablet Take 1  tablet (50 mg total) by mouth 3 (three) times daily as needed (SBP >150).   metoprolol  succinate (TOPROL -XL) 50 MG 24 hr tablet Take 50 mg by mouth daily.   oxyCODONE  (OXY IR/ROXICODONE ) 5 MG immediate release tablet Take 1 tablet (5 mg total) by mouth every 6 (six) hours as needed for moderate pain (pain score 4-6).   polyethylene glycol (MIRALAX  / GLYCOLAX ) 17 g packet Take 17 g by mouth daily as needed.   spironolactone  (ALDACTONE ) 25 MG tablet Take 37.5 mg by mouth daily.   No facility-administered encounter medications on file as of 07/08/2024.    Social History: Social History   Tobacco Use   Smoking  status: Never   Smokeless tobacco: Never  Substance Use Topics   Alcohol use: Not Currently   Drug use: Never    Family Medical History: Family History  Problem Relation Age of Onset   Stroke Father    Heart disease Father     Physical Examination: There were no vitals filed for this visit.   She is alert. She follows commands and answers questions appropriately.   No posterior cervical tenderness. No tenderness in bilateral trapezial region.   No abnormal lesions on exposed skin.   Strength: Side Biceps Triceps Deltoid Interossei Grip Wrist Ext. Wrist Flex.  R 5 5 5 5 5 5 5   L 5 5 5 5 5 5 5    Side Iliopsoas Quads Hamstring PF DF EHL  R 5 5 5 5 5 5   L 5 5 5 5 5 5    Reflexes are 2+ and symmetric at the biceps, brachioradialis, patella and achilles.   Hoffman's is absent.  Clonus is not present.   Bilateral upper and lower extremity sensation is intact to light touch.     She is in a WC. Gait not tested.   She is wearing a hard cervical collar.   Medical Decision Making  Imaging: Cervical xrays dated ***:  Significant movement C1-C2 with flex/ext. ***  Report not yet available for above xrays. Above xrays reviewed with Dr. [name]. ***  Assessment and Plan: Ms. Arango has type II odontoid fracture and burst fracture of C1 vertebral body likely since fall on 01/07/24. Both fractures seen on imaging on 02/11/24 as well.   She has no neck or arm pain. No numbness, tingling, or weakness. She has known dementia and her [name] helps with her history.   Initial AP/lateral xrays from today showed difficulty in visualizing fractures.   Treatment options discussed with patient and following plan made:   - Patient was reviewed with Dr. [name] along with her initial cervical xrays and he recommended cervical flex/ext xrays.  - She was able to get these xrays at our office but her daughter had to leave right after to get to another appointment.  - Note sent to facility  that explaining she had xray results pending. She should continue with collar when she is up and out of bed. Can remove to sleep and eat. Can go bed to bathroom with assistance, but needs WC for any longer distance.  - Will review xrays with Dr. Clois tomorrow and send updated note/orders to facility. Will also call daughter and discuss follow up.   I spent a total of 35 minutes in face-to-face and non-face-to-face activities related to this patient's care today including review of outside records, review of imaging, review of symptoms, physical exam, discussion of differential diagnosis, discussion of treatment options, and documentation.   ADDENDUM 10/165/25:  Cervical xrays (flex/ext) dated 03/25/24:  Significant movement at C1-C2 on flex/ext views.   Report not yet available for above xrays.   Xrays reviewed with Dr. [name]. He recommends the following:   - She should continue with collar when she is up and out of bed. Can remove to sleep and eat. Can go bed to bathroom with assistance, but needs WC for any longer distance.  - Cervical CT ordered to be done in 6 weeks.  - Will see me back to review CT results and will get cervical flex/ext xrays at that time.   I called her [name] to review above. She is patient's HCPA. Patient does not have a legal guardian. She is in agreement with above plan.   Of note, patient was admitted to hospice last night. [name] will discuss with them the above recommendations.   Glade Boys PA-C Dept. of Neurosurgery "

## 2024-07-08 ENCOUNTER — Other Ambulatory Visit

## 2024-07-08 ENCOUNTER — Ambulatory Visit: Admitting: Orthopedic Surgery

## 2024-07-23 ENCOUNTER — Other Ambulatory Visit

## 2024-07-23 ENCOUNTER — Ambulatory Visit: Admitting: Orthopedic Surgery
